# Patient Record
Sex: Female | Born: 1960 | Race: White | Hispanic: No | Marital: Married | Smoking: Former smoker
Health system: Southern US, Community
[De-identification: ages and names within clinical notes are randomized; demographics above are authoritative.]

## PROBLEM LIST (undated history)

## (undated) DIAGNOSIS — Z87442 Personal history of urinary calculi: Secondary | ICD-10-CM

## (undated) DIAGNOSIS — R112 Nausea with vomiting, unspecified: Secondary | ICD-10-CM

## (undated) DIAGNOSIS — Z9889 Other specified postprocedural states: Secondary | ICD-10-CM

## (undated) DIAGNOSIS — N390 Urinary tract infection, site not specified: Secondary | ICD-10-CM

## (undated) DIAGNOSIS — D682 Hereditary deficiency of other clotting factors: Secondary | ICD-10-CM

## (undated) DIAGNOSIS — D649 Anemia, unspecified: Secondary | ICD-10-CM

## (undated) DIAGNOSIS — K589 Irritable bowel syndrome without diarrhea: Secondary | ICD-10-CM

## (undated) DIAGNOSIS — T4145XA Adverse effect of unspecified anesthetic, initial encounter: Secondary | ICD-10-CM

## (undated) DIAGNOSIS — T8859XA Other complications of anesthesia, initial encounter: Secondary | ICD-10-CM

## (undated) DIAGNOSIS — I10 Essential (primary) hypertension: Secondary | ICD-10-CM

## (undated) DIAGNOSIS — M810 Age-related osteoporosis without current pathological fracture: Secondary | ICD-10-CM

## (undated) HISTORY — DX: Irritable bowel syndrome, unspecified: K58.9

## (undated) HISTORY — DX: Hereditary deficiency of other clotting factors: D68.2

## (undated) HISTORY — DX: Personal history of urinary calculi: Z87.442

## (undated) HISTORY — PX: ESOPHAGOGASTRODUODENOSCOPY: SHX1529

## (undated) HISTORY — DX: Urinary tract infection, site not specified: N39.0

## (undated) HISTORY — PX: COLONOSCOPY: SHX174

## (undated) HISTORY — PX: OTHER SURGICAL HISTORY: SHX169

## (undated) HISTORY — DX: Age-related osteoporosis without current pathological fracture: M81.0

---
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
## 1999-09-20 ENCOUNTER — Ambulatory Visit (HOSPITAL_COMMUNITY): Admission: RE | Admit: 1999-09-20 | Discharge: 1999-09-20 | Payer: Self-pay | Admitting: Family Medicine

## 1999-09-20 ENCOUNTER — Encounter: Payer: Self-pay | Admitting: Family Medicine

## 2000-07-04 ENCOUNTER — Other Ambulatory Visit: Admission: RE | Admit: 2000-07-04 | Discharge: 2000-07-04 | Payer: Self-pay | Admitting: Obstetrics and Gynecology

## 2000-07-26 ENCOUNTER — Other Ambulatory Visit: Admission: RE | Admit: 2000-07-26 | Discharge: 2000-07-26 | Payer: Self-pay | Admitting: Obstetrics and Gynecology

## 2000-12-03 ENCOUNTER — Encounter: Admission: RE | Admit: 2000-12-03 | Discharge: 2000-12-03 | Payer: Self-pay | Admitting: Obstetrics and Gynecology

## 2000-12-03 ENCOUNTER — Encounter: Payer: Self-pay | Admitting: Obstetrics and Gynecology

## 2003-07-28 ENCOUNTER — Ambulatory Visit (HOSPITAL_COMMUNITY): Admission: RE | Admit: 2003-07-28 | Discharge: 2003-07-28 | Payer: Self-pay | Admitting: Gastroenterology

## 2004-03-07 ENCOUNTER — Other Ambulatory Visit: Admission: RE | Admit: 2004-03-07 | Discharge: 2004-03-07 | Payer: Self-pay | Admitting: Obstetrics and Gynecology

## 2005-03-07 ENCOUNTER — Other Ambulatory Visit: Admission: RE | Admit: 2005-03-07 | Discharge: 2005-03-07 | Payer: Self-pay | Admitting: Obstetrics and Gynecology

## 2006-01-29 ENCOUNTER — Encounter: Admission: RE | Admit: 2006-01-29 | Discharge: 2006-01-29 | Payer: Self-pay | Admitting: Obstetrics and Gynecology

## 2006-03-12 ENCOUNTER — Other Ambulatory Visit: Admission: RE | Admit: 2006-03-12 | Discharge: 2006-03-12 | Payer: Self-pay | Admitting: Obstetrics and Gynecology

## 2007-03-13 ENCOUNTER — Other Ambulatory Visit: Admission: RE | Admit: 2007-03-13 | Discharge: 2007-03-13 | Payer: Self-pay | Admitting: Obstetrics and Gynecology

## 2007-04-22 ENCOUNTER — Observation Stay (HOSPITAL_COMMUNITY): Admission: EM | Admit: 2007-04-22 | Discharge: 2007-04-24 | Payer: Self-pay | Admitting: Emergency Medicine

## 2007-04-26 ENCOUNTER — Emergency Department (HOSPITAL_COMMUNITY): Admission: EM | Admit: 2007-04-26 | Discharge: 2007-04-26 | Payer: Self-pay | Admitting: Emergency Medicine

## 2007-04-28 ENCOUNTER — Ambulatory Visit (HOSPITAL_COMMUNITY): Admission: RE | Admit: 2007-04-28 | Discharge: 2007-04-28 | Payer: Self-pay | Admitting: Urology

## 2008-03-18 ENCOUNTER — Other Ambulatory Visit: Admission: RE | Admit: 2008-03-18 | Discharge: 2008-03-18 | Payer: Self-pay | Admitting: Obstetrics and Gynecology

## 2009-02-23 ENCOUNTER — Ambulatory Visit: Payer: Self-pay | Admitting: Diagnostic Radiology

## 2009-02-23 ENCOUNTER — Emergency Department (HOSPITAL_BASED_OUTPATIENT_CLINIC_OR_DEPARTMENT_OTHER): Admission: EM | Admit: 2009-02-23 | Discharge: 2009-02-23 | Payer: Self-pay | Admitting: Emergency Medicine

## 2009-03-22 ENCOUNTER — Other Ambulatory Visit: Admission: RE | Admit: 2009-03-22 | Discharge: 2009-03-22 | Payer: Self-pay | Admitting: Obstetrics and Gynecology

## 2009-04-04 ENCOUNTER — Encounter: Admission: RE | Admit: 2009-04-04 | Discharge: 2009-05-12 | Payer: Self-pay | Admitting: Orthopedic Surgery

## 2010-04-06 ENCOUNTER — Other Ambulatory Visit: Admission: RE | Admit: 2010-04-06 | Discharge: 2010-04-06 | Payer: Self-pay | Admitting: Obstetrics and Gynecology

## 2010-05-02 ENCOUNTER — Encounter: Admission: RE | Admit: 2010-05-02 | Discharge: 2010-05-02 | Payer: Self-pay | Admitting: Obstetrics and Gynecology

## 2010-05-09 ENCOUNTER — Encounter: Admission: RE | Admit: 2010-05-09 | Discharge: 2010-05-09 | Payer: Self-pay | Admitting: Obstetrics and Gynecology

## 2010-10-17 ENCOUNTER — Ambulatory Visit: Payer: Self-pay | Admitting: Hematology & Oncology

## 2010-10-17 LAB — CBC WITH DIFFERENTIAL (CANCER CENTER ONLY)
BASO#: 0 10*3/uL (ref 0.0–0.2)
Eosinophils Absolute: 0.1 10*3/uL (ref 0.0–0.5)
HCT: 26.7 % — ABNORMAL LOW (ref 34.8–46.6)
HGB: 7.7 g/dL — ABNORMAL LOW (ref 11.6–15.9)
LYMPH#: 1.4 10*3/uL (ref 0.9–3.3)
LYMPH%: 37 % (ref 14.0–48.0)
MCV: 65 fL — ABNORMAL LOW (ref 81–101)
MONO#: 0.2 10*3/uL (ref 0.1–0.9)
NEUT%: 54 % (ref 39.6–80.0)
RDW: 18.6 % — ABNORMAL HIGH (ref 10.5–14.6)
WBC: 3.8 10*3/uL — ABNORMAL LOW (ref 3.9–10.0)

## 2010-10-18 LAB — FERRITIN: Ferritin: 6 ng/mL — ABNORMAL LOW (ref 10–291)

## 2010-10-18 LAB — COMPREHENSIVE METABOLIC PANEL
ALT: 19 U/L (ref 0–35)
AST: 17 U/L (ref 0–37)
Albumin: 4.2 g/dL (ref 3.5–5.2)
Alkaline Phosphatase: 94 U/L (ref 39–117)
BUN: 14 mg/dL (ref 6–23)
Calcium: 9.4 mg/dL (ref 8.4–10.5)
Chloride: 109 mEq/L (ref 96–112)
Potassium: 3.8 mEq/L (ref 3.5–5.3)
Sodium: 142 mEq/L (ref 135–145)
Total Protein: 6.6 g/dL (ref 6.0–8.3)

## 2010-10-18 LAB — RETICULOCYTES (CHCC)
ABS Retic: 184.9 10*3/uL (ref 19.0–186.0)
RBC.: 4.02 MIL/uL (ref 3.87–5.11)

## 2010-10-18 LAB — IRON AND TIBC: UIBC: 429 ug/dL

## 2010-10-19 ENCOUNTER — Encounter (HOSPITAL_COMMUNITY)
Admission: RE | Admit: 2010-10-19 | Discharge: 2010-12-22 | Payer: Self-pay | Source: Home / Self Care | Attending: Hematology & Oncology | Admitting: Hematology & Oncology

## 2010-10-20 LAB — TYPE & CROSSMATCH - CHCC SATELLITE

## 2010-10-26 LAB — CBC WITH DIFFERENTIAL (CANCER CENTER ONLY)
BASO%: 0.6 % (ref 0.0–2.0)
EOS%: 2.1 % (ref 0.0–7.0)
HCT: 35.9 % (ref 34.8–46.6)
LYMPH%: 30.7 % (ref 14.0–48.0)
MCH: 22.9 pg — ABNORMAL LOW (ref 26.0–34.0)
MCHC: 31.3 g/dL — ABNORMAL LOW (ref 32.0–36.0)
MCV: 73 fL — ABNORMAL LOW (ref 81–101)
MONO#: 0.4 10*3/uL (ref 0.1–0.9)
NEUT%: 57.9 % (ref 39.6–80.0)
RDW: 21.6 % — ABNORMAL HIGH (ref 10.5–14.6)

## 2010-12-05 ENCOUNTER — Ambulatory Visit: Payer: Self-pay | Admitting: Hematology & Oncology

## 2010-12-07 LAB — CBC WITH DIFFERENTIAL (CANCER CENTER ONLY)
BASO%: 0.4 % (ref 0.0–2.0)
EOS%: 3.5 % (ref 0.0–7.0)
MCH: 26.6 pg (ref 26.0–34.0)
MCHC: 32.4 g/dL (ref 32.0–36.0)
MONO%: 6.1 % (ref 0.0–13.0)
NEUT#: 2.2 10*3/uL (ref 1.5–6.5)
Platelets: 253 10*3/uL (ref 145–400)

## 2010-12-07 LAB — RETICULOCYTES (CHCC): Retic Ct Pct: 1.5 % (ref 0.4–3.1)

## 2010-12-07 LAB — CHCC SATELLITE - SMEAR

## 2011-01-22 ENCOUNTER — Ambulatory Visit (HOSPITAL_BASED_OUTPATIENT_CLINIC_OR_DEPARTMENT_OTHER): Payer: BC Managed Care – PPO | Admitting: Hematology & Oncology

## 2011-01-25 ENCOUNTER — Encounter (HOSPITAL_BASED_OUTPATIENT_CLINIC_OR_DEPARTMENT_OTHER): Payer: BC Managed Care – PPO | Admitting: Hematology & Oncology

## 2011-01-25 DIAGNOSIS — R195 Other fecal abnormalities: Secondary | ICD-10-CM

## 2011-01-25 DIAGNOSIS — R197 Diarrhea, unspecified: Secondary | ICD-10-CM

## 2011-01-25 DIAGNOSIS — D509 Iron deficiency anemia, unspecified: Secondary | ICD-10-CM

## 2011-01-25 DIAGNOSIS — K589 Irritable bowel syndrome without diarrhea: Secondary | ICD-10-CM

## 2011-01-25 LAB — CBC WITH DIFFERENTIAL (CANCER CENTER ONLY)
BASO%: 0.6 % (ref 0.0–2.0)
Eosinophils Absolute: 0.1 10*3/uL (ref 0.0–0.5)
HCT: 43 % (ref 34.8–46.6)
LYMPH#: 1.7 10*3/uL (ref 0.9–3.3)
MONO#: 0.3 10*3/uL (ref 0.1–0.9)
NEUT%: 50.6 % (ref 39.6–80.0)
RBC: 5.07 10*6/uL (ref 3.70–5.32)
RDW: 12.1 % (ref 10.5–14.6)
WBC: 4.3 10*3/uL (ref 3.9–10.0)

## 2011-01-25 LAB — RETICULOCYTES (CHCC)
ABS Retic: 96.5 10*3/uL (ref 19.0–186.0)
RBC.: 5.08 MIL/uL (ref 3.87–5.11)
Retic Ct Pct: 1.9 % (ref 0.4–3.1)

## 2011-01-25 LAB — CHCC SATELLITE - SMEAR

## 2011-03-07 LAB — CROSSMATCH

## 2011-04-05 ENCOUNTER — Other Ambulatory Visit: Payer: Self-pay | Admitting: Hematology & Oncology

## 2011-04-05 ENCOUNTER — Encounter (HOSPITAL_BASED_OUTPATIENT_CLINIC_OR_DEPARTMENT_OTHER): Payer: BLUE CROSS/BLUE SHIELD | Admitting: Hematology & Oncology

## 2011-04-05 DIAGNOSIS — R197 Diarrhea, unspecified: Secondary | ICD-10-CM

## 2011-04-05 DIAGNOSIS — D509 Iron deficiency anemia, unspecified: Secondary | ICD-10-CM

## 2011-04-05 DIAGNOSIS — R195 Other fecal abnormalities: Secondary | ICD-10-CM

## 2011-04-05 DIAGNOSIS — K589 Irritable bowel syndrome without diarrhea: Secondary | ICD-10-CM

## 2011-04-05 LAB — CBC WITH DIFFERENTIAL (CANCER CENTER ONLY)
Eosinophils Absolute: 0.1 10*3/uL (ref 0.0–0.5)
LYMPH#: 1.9 10*3/uL (ref 0.9–3.3)
MCV: 83 fL (ref 81–101)
MONO#: 0.4 10*3/uL (ref 0.1–0.9)
NEUT#: 2.6 10*3/uL (ref 1.5–6.5)
Platelets: 272 10*3/uL (ref 145–400)
RBC: 4.83 10*6/uL (ref 3.70–5.32)
WBC: 5.1 10*3/uL (ref 3.9–10.0)

## 2011-04-05 LAB — RETICULOCYTES (CHCC)
ABS Retic: 104.4 10*3/uL (ref 19.0–186.0)
RBC.: 4.97 MIL/uL (ref 3.87–5.11)
Retic Ct Pct: 2.1 % (ref 0.4–3.1)

## 2011-04-05 LAB — FERRITIN: Ferritin: 21 ng/mL (ref 10–291)

## 2011-04-05 LAB — IRON AND TIBC: Iron: 66 ug/dL (ref 42–145)

## 2011-04-10 ENCOUNTER — Encounter (HOSPITAL_BASED_OUTPATIENT_CLINIC_OR_DEPARTMENT_OTHER): Payer: BLUE CROSS/BLUE SHIELD | Admitting: Hematology & Oncology

## 2011-04-10 DIAGNOSIS — D509 Iron deficiency anemia, unspecified: Secondary | ICD-10-CM

## 2011-04-11 ENCOUNTER — Emergency Department (HOSPITAL_COMMUNITY): Payer: Worker's Compensation

## 2011-04-11 ENCOUNTER — Inpatient Hospital Stay (HOSPITAL_COMMUNITY)
Admission: EM | Admit: 2011-04-11 | Discharge: 2011-04-14 | DRG: 512 | Disposition: A | Discharge status: Home / Self Care | Payer: Worker's Compensation | Attending: Orthopedic Surgery | Admitting: Orthopedic Surgery

## 2011-04-11 DIAGNOSIS — W19XXXA Unspecified fall, initial encounter: Secondary | ICD-10-CM | POA: Diagnosis present

## 2011-04-11 DIAGNOSIS — I10 Essential (primary) hypertension: Secondary | ICD-10-CM | POA: Diagnosis present

## 2011-04-11 DIAGNOSIS — Y990 Civilian activity done for income or pay: Secondary | ICD-10-CM

## 2011-04-11 DIAGNOSIS — S52009A Unspecified fracture of upper end of unspecified ulna, initial encounter for closed fracture: Principal | ICD-10-CM | POA: Diagnosis present

## 2011-04-11 DIAGNOSIS — S52023A Displaced fracture of olecranon process without intraarticular extension of unspecified ulna, initial encounter for closed fracture: Secondary | ICD-10-CM | POA: Diagnosis present

## 2011-04-11 LAB — DIFFERENTIAL
Eosinophils Absolute: 0 10*3/uL (ref 0.0–0.7)
Eosinophils Relative: 0 % (ref 0–5)
Lymphs Abs: 1.6 10*3/uL (ref 0.7–4.0)
Monocytes Relative: 5 % (ref 3–12)

## 2011-04-11 LAB — COMPREHENSIVE METABOLIC PANEL
AST: 24 U/L (ref 0–37)
Albumin: 4.2 g/dL (ref 3.5–5.2)
BUN: 15 mg/dL (ref 6–23)
CO2: 25 mEq/L (ref 19–32)
Calcium: 9.9 mg/dL (ref 8.4–10.5)
Creatinine, Ser: 0.73 mg/dL (ref 0.4–1.2)
GFR calc non Af Amer: >60 mL/min (ref >60)

## 2011-04-11 LAB — CBC
MCH: 28.7 pg (ref 26.0–34.0)
MCV: 84.2 fL (ref 78.0–100.0)
Platelets: 266 10*3/uL (ref 150–400)
RDW: 13.6 % (ref 11.5–15.5)

## 2011-04-11 LAB — URINALYSIS, ROUTINE W REFLEX MICROSCOPIC
Hgb urine dipstick: NEGATIVE
Ketones, ur: 15 mg/dL — AB
Protein, ur: 30 mg/dL — AB
Urobilinogen, UA: 0.2 mg/dL (ref 0.0–1.0)

## 2011-04-11 LAB — URINE MICROSCOPIC-ADD ON

## 2011-04-17 NOTE — Consult Note (Signed)
  NAMEEVEE, LISKA                 ACCOUNT NO.:  [id number]  MEDICAL RECORD NO.:  [id number]           PATIENT TYPE:  I  LOCATION:  1617                         FACILITY:  [facility]  PHYSICIAN:  Madelynn Done, MD  DATE OF BIRTH:  02/23/1961  DATE OF CONSULTATION:  04/12/2011 DATE OF DISCHARGE:                                CONSULTATION   Ms. Arrighi is a 50 year old left hand dominant female who sustained elbow fracture dislocation.  The patient was seen and evaluated at bedside. Her full history and physical were notified by Alphonsa Overall, PA-C.  ER records were reviewed.  SUMMARY:  The patient had a closed Monteggia fracture dislocation with a fracture of the radial head, posterolateral dislocation and fracture of the proximal olecranon.  While I was at the bedside, 15 cc of 1% Xylocaine were injected into the elbow joint and then the elbow was reduced, placed in a long-arm splint for comfort.  The patient tolerated the reduction at the bedside.  After she was placed in a splint, her pain was better controlled.  I had an extensive conversation with both she and her husband about the nature of her injury.  We talked about at length the CT scan, the radiographs showing the nature of the injury, fracture pattern and the dislocation.  We talked about operative intervention to restore the elbow congruity at both the radial head and the proximal olecranon.  We talked about surgical intervention including ORIF of proximal radius and proximal ulna as well as potential complications of surgery to include but not limited to reaction to anesthesia, nonunion, malunion, hardware failure, loss of motion of the wrist and digits, damage to nearby nerves, arteries or tendons, infection, stiffness in her elbow and need for further surgical intervention.  Again, patient's questions were addressed with both she and her husband.  Approximately, over 30 minutes were spent counseling the patient and the  husband.  They voiced understanding the reason for the intervention and agreed to proceed accordingly within the coming 24 hours.  All questions were answered for her today.  She will be admitted for IV pain medications for the upcoming surgery.     Madelynn Done, MD     FWO/MEDQ  D:  04/12/2011  T:  04/13/2011  Job:  [id number]  Electronically Signed by Bradly Bienenstock IV MD on 04/17/2011 05:52:41 PM

## 2011-04-17 NOTE — Discharge Summary (Signed)
  Jamie Mcdowell, TERHAAR                 ACCOUNT NO.:  [id number]  MEDICAL RECORD NO.:  [id number]           PATIENT TYPE:  I  LOCATION:  1617                         FACILITY:  [facility]  PHYSICIAN:  Madelynn Done, MD  DATE OF BIRTH:  11-25-1961  DATE OF ADMISSION:  04/11/2011 DATE OF DISCHARGE:  04/14/2011                              DISCHARGE SUMMARY   ADMISSION DIAGNOSES: 1. Left elbow fracture dislocation, Monteggia variant. 2. Hypertension.  DISCHARGE DIAGNOSES: 1. Left elbow fracture dislocation, Monteggia variant. 2. Hypertension.  PROCEDURE:  Open reduction and internal fixation of displaced left elbow fracture dislocation on April 12, 2011.  DISCHARGE MEDICATIONS: 1. Percocet 5/325 1 to 2 tablets every 4 to 6 hours as needed for     pain. 2. Colace 100 mg p.o. b.i.d. 3. Robaxin 500 mg p.o. q.6h. p.r.n. spasm. 4. Resume home medications and doses to include over-the-counter     Prilosec.  REASON FOR ADMISSION:  Jamie Mcdowell is a 50 year old female who sustained a closed elbow fracture dislocation.  The patient was initially reduced, splinted and then admitted for pain control and operative intervention. The patient voiced understanding and the reason for the admission.  HOSPITAL COURSE:  The patient admitted to the orthopedic floor following the above procedure.  The patient tolerated this well under general anesthesia.  The patient was continued on postoperative IV pain medications.  She was followed throughout her hospital course.  The patient seen and examined on postoperative day #2.  She was afebrile. Vital signs stable and normal.  Tolerating regular diet and felt ready to be discharged to home.  On the day of discharge, the patient voiced understand of the plan and discharge instructions.  All questions were answered.  RECOMMENDATIONS AND DISPOSITION:  The patient will be discharged to home, seen back in the office in approximately 10 to 14 days for wound check  and possible suture removal and then begin therapy regimen, working on her elbow and begin some gentle active range of motion. Continue the above discharge medications.  If she has any worsening pain or problems, she does have my cell phone number to contact me.  All questions were answered.  The patient voiced understanding.  CONDITION ON DISCHARGE:  Good.     Madelynn Done, MD     FWO/MEDQ  D:  04/16/2011  T:  04/16/2011  Job:  [id number]  Electronically Signed by Bradly Bienenstock IV MD on 04/17/2011 05:52:49 PM

## 2011-04-17 NOTE — Op Note (Signed)
Jamie Mcdowell, Jamie Mcdowell                 ACCOUNT NO.:  [id number]  MEDICAL RECORD NO.:  [id number]           PATIENT TYPE:  I  LOCATION:  1617                         FACILITY:  [facility]  PHYSICIAN:  Madelynn Done, MD  DATE OF BIRTH:  1961/03/20  DATE OF PROCEDURE: DATE OF DISCHARGE:                              OPERATIVE REPORT   PREOPERATIVE DIAGNOSES:  Left elbow Monteggia fracture dislocation with fracture of the proximal radius, elbow dislocation and proximal olecranon fracture.  POSTOPERATIVE DIAGNOSES:  Left elbow Monteggia fracture dislocation with fracture of the proximal radius, elbow dislocation and proximal olecranon fracture.  ATTENDING PHYSICIAN:  Sharma Covert IV, MD, who was scrubbed and present for the entire procedure.  ASSISTANT SURGEON:  Maisie Fus B. Dixon, PA-C, who was scrubbed and present for key portions of procedure.  PROCEDURES: 1. Open treatment of left proximal olecranon fracture with internal     fixation. 2. Open treatment of left proximal radius fracture with internal     fixation, radial head. 3. Open treatment of left elbow dislocation. 4. Radiographs 3 views, stress radiography.  SURGICAL IMPLANTS: 1. For the radial head, one 18 micro Acutrak screw. 2. For the proximal olecranon, DePuy, proximal olecranon plate with 3     bicortical screws distally, 1 bicortical lag screw and 5 locking     screws proximally.  These are 3.5 mm screws.  SURGICAL INDICATIONS:  Jamie Mcdowell is a 50 year old right-hand-dominant female who sustained a very bad elbow injury while she was at school. The patient was seen and evaluated in the emergency department and at the bedside it was recommended given the nature of her injury to undergo the above procedure.  Risks, benefits, and alternatives were discussed in detail with the patient and signed informed consent was obtained. Risks include, but not limited to bleeding, infection, damage to nearby nerves, arteries or  tendons, nonunion, malunion, hardware failure, stiffness of the elbow, loss of motion of wrist and digits and need for further surgical intervention.  DESCRIPTION OF PROCEDURE:  The patient was properly identified in preop holding area and marker made on the left elbow to indicate correct operative site.  The patient was brought back to the operating room, placed supine on anesthesia table where general anesthesia was administered.  The patient received preoperative antibiotics prior to skin incision.  The patient was then placed in a slightly lazy lateral position bringing along the arm to move nicely across the chest.  All pressure points were well padded.  A Foley catheter was placed.  The patient tolerated this well.  SCDs were in place the entire time.  After this was carried out, the left upper extremity was then prepped and draped in normal sterile fashion.  Timeout was called.  The correct side was identified and the procedure was then begun.  Attention was then turned to the left elbow where a curvilinear incision was made directly over the olecranon tip curving radially.  Dissection was then carried down through the skin and subcutaneous tissue.  The fascial layer was incised longitudinally and the fracture site was then opened and exposed.  The patient did have a highly comminuted proximal olecranon fracture.  Carrying the dissection radially, the posterolateral wall of the proximal olecranon is blown out and that is where the dislocation of the elbow was then done.  The elbow was reduced openly.  There was good articulation between the proximal radius and the capitellum.  This allowed for good visualization of the joint.  Through this area, the proximal radial head was then identified and the fracture line was identified.  The fracture line had reduced very well.  Open treatment of the radial head fracture was then carried out.  Two K-wires were then placed in the radial head  fracture and then appropriate depth gauge and measurements were done with mini Acutrak screw for the small anterior radial head piece.  Once this was carried out, radiographs were then obtained in all 3 views to confirm placement of the radial head fracture and the internal fixation in good position.  Following this, attention was then turned to proximal olecranon.  The open reduction was then carried out.  The fracture fragments then had large good soft tissue windows attached underneath, the comminuted fragments were then carefully manipulated back into place, keeping the elbow in good position.  The DePuy proximal olecranon plate was then applied and then held temporarily in place with the K-wire proximally and distally to confirm the length and position of the olecranon.  Once this was confirmed, proximal fixation was then carried out with 5 locking screws in the proximal segment.  There was good purchase and good fixation of locking screws.  Attention was then turned distally where 3 more bicortical nonlocking screws were then placed in the plane. The fracture aligned very nicely.  The patient did have 1 large lag and 1 large butterfly fragment, which was able to be captured after reduction clamp was then applied and then with a 3.5 bicortical nonlocking screw in the fracture fragment.  The patient again did have a highly comminuted diaphyseal portion of the bone but these pieces were able to be saved to reconstitute the diaphysis of the proximal olecranon.  Once this was carried out with fixation of the proximal olecranon, final radiographs and stress radiography was then carried out in AP, lateral, and oblique planes showing internal fixation in place with good congruity of the ulnar humeral joint and radiocapitellar joint.  Following this, the wounds were then thoroughly irrigated.  The tourniquet had been insufflated for 113 minutes to 250 mmHg.  There was good hemostasis  following deflation of the tourniquet.  Copious irrigation done.  The interval along the posterolateral region of the elbow was then closed with 2-0 Vicryl suture.  Once this was carried out, the fascial layer over the plate was then closed with 2-0 Vicryl. The subcutaneous tissue was closed with 4-0 Vicryl and the skin closed with skin staples.  10 cc of 0.25% Marcaine was then infiltrated posteriorly.  Adaptic dressing and sterile compressive bandage were then applied.  The patient was then placed in a well-padded long-arm splint. The patient was then extubated and taken to recovery room in good condition.  POSTPROCEDURAL PLAN:  The patient will be admitted for IV antibiotic and pain control and is to be discharged once her pain is controlled.  See her back in the office in approximately 14 days for wound check, staple removal and then begin some gentle active range of motion to get the elbow moving, radiographs at each visit.     Madelynn Done, MD  FWO/MEDQ  D:  04/12/2011  T:  04/13/2011  Job:  [id number]  Electronically Signed by Bradly Bienenstock IV MD on 04/17/2011 05:52:45 PM

## 2011-05-11 NOTE — Op Note (Signed)
   NAMEARANTZA, DARRINGTON                           ACCOUNT NO.:  [id number]   MEDICAL RECORD NO.:  [id number]                   PATIENT TYPE:  AMB   LOCATION:  ENDO                                 FACILITY:  [facility]   PHYSICIAN:  Anselmo Rod, M.D.               DATE OF BIRTH:  [birth year]   DATE OF PROCEDURE:  07/28/2003  DATE OF DISCHARGE:                                 OPERATIVE REPORT   PROCEDURE:  Colonoscopy.   ENDOSCOPIST:  Charna Elizabeth, M.D.   INSTRUMENT USED:  Olympus video colonoscope.   INDICATIONS FOR PROCEDURE:  49 year old white female with a history of  rectal bleeding, rule out colonic polyps, masses, hemorrhoids, etc.   PREPROCEDURE PREPARATION:  Informed consent was obtained from the patient.  The patient was fasted for eight hours prior to the procedure and prepped  with a bottle of magnesium citrate and a gallon of GoLYTELY the night prior  to the procedure.   PREPROCEDURE PHYSICAL:  Patient with stable vital signs.  Neck supple.  Chest clear to auscultation.  S1 and S2 regular.  Abdomen soft with normal  bowel sounds.   DESCRIPTION OF PROCEDURE:  The patient was placed in the left lateral  decubitus position, sedated with 140 mg of Demerol and 13 mg Versed  intravenously.  Once the patient was adequately sedated, maintained on low  flow oxygen, continuous cardiac monitoring, the Olympus video colonoscope  was advanced into the rectum to the cecum and terminal ileum.  The patient  had some abdominal discomfort with insufflation of air into the colon.  A  small fistulous opening was seen on careful anal inspection.  It is  difficult to say whether this is a small fissure versus a fistula.  There  was some mucoid discharge from this area.  Retroflexion in the rectum  reveals no abnormalities.  The terminal ileum appeared normal.   IMPRESSION:  Normal appearing colon and terminal ileum except for  questionable fissure versus fistula on anal inspection.    RECOMMENDATIONS:  1. Local lidocaine to be applied around the rectum for symptomatic relief.  2. Outpatient follow up in the next two weeks for further recommendations.                                               Anselmo Rod, M.D.    JNM/MEDQ  D:  07/28/2003  T:  07/28/2003  Job:  [id number]   cc:   Pollyann Savoy, M.D.  [address]  [place], [place] [address]  Fax: [phone]   [name], M.D.  [address]  [place]  [place] [address]  Fax: [phone]

## 2011-07-13 ENCOUNTER — Encounter (HOSPITAL_BASED_OUTPATIENT_CLINIC_OR_DEPARTMENT_OTHER): Payer: BLUE CROSS/BLUE SHIELD | Admitting: Hematology & Oncology

## 2011-07-13 ENCOUNTER — Other Ambulatory Visit: Payer: Self-pay | Admitting: Hematology & Oncology

## 2011-07-13 DIAGNOSIS — K589 Irritable bowel syndrome without diarrhea: Secondary | ICD-10-CM

## 2011-07-13 DIAGNOSIS — R195 Other fecal abnormalities: Secondary | ICD-10-CM

## 2011-07-13 DIAGNOSIS — R197 Diarrhea, unspecified: Secondary | ICD-10-CM

## 2011-07-13 DIAGNOSIS — D509 Iron deficiency anemia, unspecified: Secondary | ICD-10-CM

## 2011-07-13 LAB — CBC WITH DIFFERENTIAL (CANCER CENTER ONLY)
BASO#: 0 10*3/uL (ref 0.0–0.2)
HCT: 43.5 % (ref 34.8–46.6)
HGB: 15.2 g/dL (ref 11.6–15.9)
LYMPH#: 2 10*3/uL (ref 0.9–3.3)
MONO#: 0.4 10*3/uL (ref 0.1–0.9)
NEUT#: 2.3 10*3/uL (ref 1.5–6.5)
NEUT%: 48.3 % (ref 39.6–80.0)
RBC: 5.02 10*6/uL (ref 3.70–5.32)
WBC: 4.8 10*3/uL (ref 3.9–10.0)

## 2011-07-13 LAB — RETICULOCYTES (CHCC)
ABS Retic: 131.6 10*3/uL (ref 19.0–186.0)
RBC.: 5.06 MIL/uL (ref 3.87–5.11)
Retic Ct Pct: 2.6 % — ABNORMAL HIGH (ref 0.4–2.3)

## 2011-10-20 DIAGNOSIS — D509 Iron deficiency anemia, unspecified: Secondary | ICD-10-CM | POA: Insufficient documentation

## 2011-11-05 ENCOUNTER — Encounter: Payer: Self-pay | Admitting: *Deleted

## 2011-11-13 ENCOUNTER — Other Ambulatory Visit (HOSPITAL_BASED_OUTPATIENT_CLINIC_OR_DEPARTMENT_OTHER): Payer: BLUE CROSS/BLUE SHIELD | Admitting: Lab

## 2011-11-13 ENCOUNTER — Ambulatory Visit (HOSPITAL_BASED_OUTPATIENT_CLINIC_OR_DEPARTMENT_OTHER): Payer: BLUE CROSS/BLUE SHIELD | Admitting: Hematology & Oncology

## 2011-11-13 ENCOUNTER — Other Ambulatory Visit: Payer: Self-pay | Admitting: Hematology & Oncology

## 2011-11-13 VITALS — BP 176/116 | Temp 98.4°F | Ht 64.5 in | Wt 193.0 lb

## 2011-11-13 DIAGNOSIS — D509 Iron deficiency anemia, unspecified: Secondary | ICD-10-CM

## 2011-11-13 DIAGNOSIS — I10 Essential (primary) hypertension: Secondary | ICD-10-CM

## 2011-11-13 LAB — RETICULOCYTES (CHCC)
ABS Retic: 136.1 10*3/uL (ref 19.0–186.0)
RBC.: 4.86 MIL/uL (ref 3.87–5.11)
RBC.: 4.86 MIL/uL (ref 3.87–5.11)
RBC.: 4.86 MIL/uL (ref 3.87–5.11)
Retic Ct Pct: 2.8 % — ABNORMAL HIGH (ref 0.4–2.3)
Retic Ct Pct: 2.8 % — ABNORMAL HIGH (ref 0.4–2.3)

## 2011-11-13 LAB — CBC WITH DIFFERENTIAL (CANCER CENTER ONLY)
BASO#: 0 10*3/uL (ref 0.0–0.2)
EOS%: 3.6 % (ref 0.0–7.0)
Eosinophils Absolute: 0.2 10*3/uL (ref 0.0–0.5)
HGB: 13.9 g/dL (ref 11.6–15.9)
LYMPH#: 1.5 10*3/uL (ref 0.9–3.3)
MCHC: 34.2 g/dL (ref 32.0–36.0)
MONO%: 8.4 % (ref 0.0–13.0)
NEUT#: 2.2 10*3/uL (ref 1.5–6.5)
Platelets: 270 10*3/uL (ref 145–400)
RBC: 4.77 10*6/uL (ref 3.70–5.32)

## 2011-11-13 LAB — CHCC SATELLITE - SMEAR

## 2011-11-13 LAB — IRON AND TIBC
TIBC: 339 ug/dL (ref 250–470)
TIBC: 339 ug/dL (ref 250–470)
UIBC: 270 ug/dL (ref 125–400)

## 2011-11-13 MED ORDER — TRIAMTERENE-HCTZ 37.5-25 MG PO TABS: 1.0000 | ORAL_TABLET | Freq: Every day | ORAL | Status: DC

## 2011-11-13 NOTE — Progress Notes (Signed)
CC:   [name], M.D. Artist Pais, M.D. Willis Modena, MD  DIAGNOSIS: 1. 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 2. Hypertension.  CURRENT THERAPY:  IV iron as indicated.  INTERVAL HISTORY:  Jamie Mcdowell comes in for followup.  She got iron back in April.  The following day, she fell at work.  She shattered her left forearm.  She required surgery for this.  She took rehab.  She still has some limitations of the left elbow, but it is getting better.  She is worried about fluid retention.  Her blood pressure has been very high.  She is not taking anything for her blood pressure.  I think she was on something, but she did not like what she took and stopped taking it.  She sees, I think, Dr. Katrinka Blazing next month for blood pressure management.  I will go ahead and put her on some Maxzide to see if this does not help with blood pressure control.  She has not noticed any bleeding.  There has been no melena or bright red blood per rectum.  She has not noted any obvious swelling in her legs.  She has had a good appetite.  She is trying to watch what she eats.  There has been no cough or shortness of breath.  PHYSICAL EXAMINATION:  General:  This is a well-developed, well- nourished white female in no obvious distress.  Vital Signs:  98.4, pulse 61, respiratory rate 18, blood pressure 136/116.  Weight is 193. Head and Neck Exam:  Shows a normocephalic, atraumatic skull.  There are no ocular or oral lesions.  There are no palpable cervical or supraclavicular lymph nodes.  Lungs:  Clear bilaterally.  Cardiac Exam: Regular rate and rhythm with normal S1 and S2.  There are no murmurs, rubs or bruits.  Abdominal Exam:  Soft with good bowel sounds.  There is no palpable abdominal mass.  There is no fluid wave.  There is no palpable hepatosplenomegaly.  Back Exam:  No tenderness over the spine, ribs or hips.  Extremities:  Show the surgical scar in the left forearm. This  is well healed.  She has some limited range motion of the left elbow.  She has good pulses in her distal extremities.  Skin Exam:  No rashes, ecchymosis or petechia.  LABORATORY STUDIES:  White cell count is 4.2, hemoglobin 13.9, hematocrit 40.6, platelet count 270.  MCV is 85.  IMPRESSION:  Jamie Mcdowell is a 50 year old white female with a history of iron deficiency anemia.  Hopefully, her iron studies will be okay today. Her blood count is down a little, but she is not anemic.  My concern is her blood pressure.  If she does not get this under better control, she will start developing kidney problems, and then she will really have issues with anemia.  We will plan to get her back to see [place] in another 3 months or so.  She is supposed to see her family doctor in a month, and hopefully, her blood pressure will be addressed.    ______________________________ Josph Macho, M.D. PRE/MEDQ  D:  11/13/2011  T:  11/13/2011  Job:  [id number]  ADDENDUM:  Ferritin is 39.  %Sat is 20.

## 2011-11-13 NOTE — Progress Notes (Signed)
This office note has been dictated.

## 2011-11-13 NOTE — Progress Notes (Signed)
CC:   Jamie Mcdowell, M.D. Jamie Mcdowell, M.D. Jamie Modena, MD  DIAGNOSIS: 1. Iron deficiency anemia. 2. Hypertension.  CURRENT THERAPY:  IV iron as indicated.  INTERVAL HISTORY:  Jamie Mcdowell comes in for followup.  I think she last got iron back in April.  She did well with the iron pills, the next day, however, she apparently fell at work.  She shattered her left forearm. She needed surgery for this.  She has been doing rehab for this.  She has a not had any obvious bleeding.  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 Her blood pressure has been poorly controlled. She is not too keen on taking blood pressure medication.  She is worried about fluid retention.  I want to put her on some diuretic.  I will put her on Maxzide(37.5/25) and hopefully this may help her a little bit.  PHYSICAL EXAM:  General:  This is a well-developed, well-nourished white female in no obvious distress.  Vital signs:  Temperature 98.4, pulse 61, respiratory rate 18, blood pressure 176/116.  Weight is 193.  Head and neck:  Shows a normocephalic, atraumatic skull.  There are no ocular or oral lesions.  There are no palpable cervical, supraclavicular lymph nodes.  Lungs:  Clear bilaterally.  Cardiac:  Regular rate and rhythm with a normal S1, S2.  There are no murmurs, rubs or bruits.  Abdomen: Soft with good bowel sounds.  There is no palpable abdominal mass. There is no palpable hepatosplenomegaly.  Extremities:  Shows the surgical scar on the flexor aspect of the left forearm.  She has minimal swelling of the left forearm.  She does have some decreased range of motion of the left elbow.  He has good pulses in her distal extremities. Skin:  Shows no rashes, ecchymosis or petechia.  LABORATORY STUDIES:  White cell count is 4.2, hemoglobin 14, hematocrit 40.6, platelet count 270.  IMPRESSION:  Jamie Mcdowell is a 50 year old white female with history of iron- deficiency anemia.  She has  history of peptic ulcer disease.  We will see what her iron levels are.  We will try to hold off on iron if we can.  I am not sure as to why she fell and shattered her left forearm.  She is recovering from this pretty well with the help of surgery.  I think we can probably get her back in 4 months' time for followup for her anemia.  I did give her the Maxzide.  She is supposed to see her family doctor in 1 month.  Hopefully, her blood pressure can get under better control.  I told Jamie Mcdowell that if her blood pressure is not controlled that her kidneys will suffer.  This will really cause problems with anemia.    ______________________________ Josph Macho, M.D. PRE/MEDQ  D:  11/13/2011  T:  11/13/2011  Job:  [id number]  ADDENDUM:  Ferritin is 39.  %Sat is 20%.

## 2012-02-08 IMAGING — CT CT ELBOW*L* W/O CM
1 series · 12 of 14 positions shown, 15 images · non-contrast
Comparison: 04/11/2011 elbow radiographs.

CLINICAL DATA: Fall today.  Evaluate elbow fracture.

CT OF THE LEFT ELBOW WITHOUT CONTRAST
TECHNIQUE: Multidetector CT imaging was performed according to the
standard protocol. Multiplanar CT image reconstructions were also
generated.

[Series 6: left elbow 3.0 b30s · axial · 0.22mm/px · z∈[+867,+984]mm · 12 of 47 slices shown, 15 images]
[im 4/47  soft-tissue]
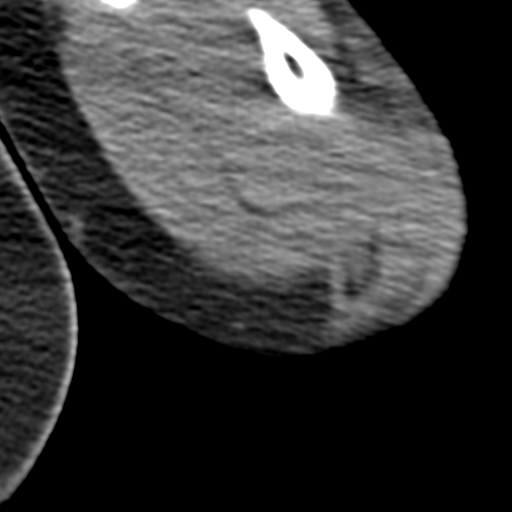
[im 4/47  bone]
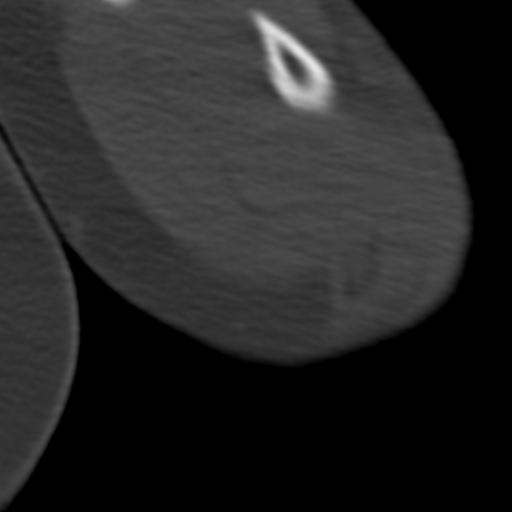
[im 8/47  bone]
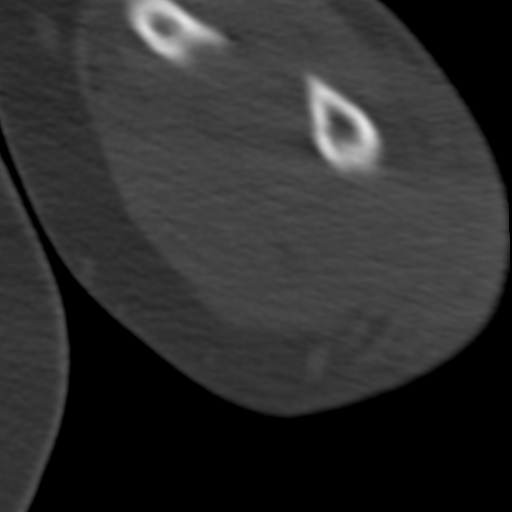
[im 11/47  bone]
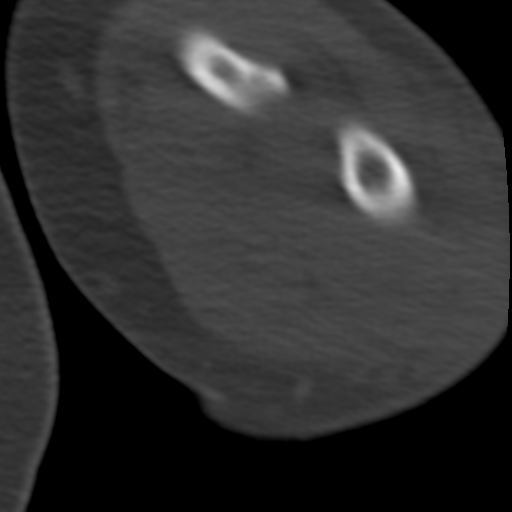
[im 15/47  bone]
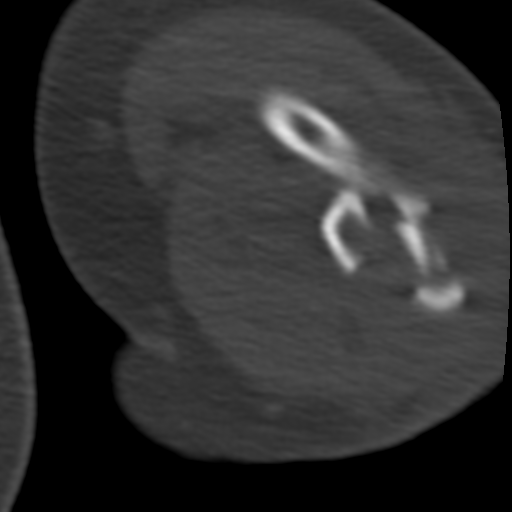
[im 18/47  soft-tissue]
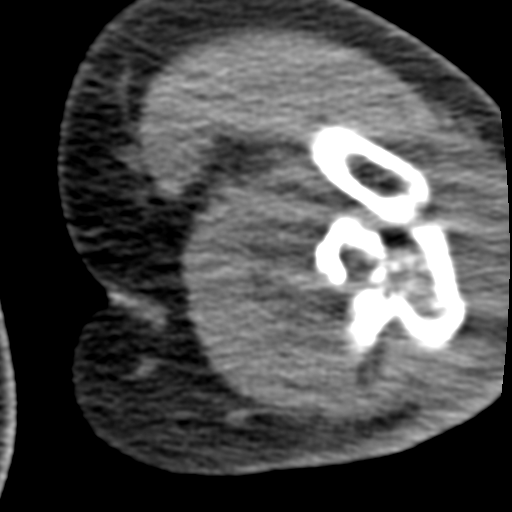
[im 18/47  bone]
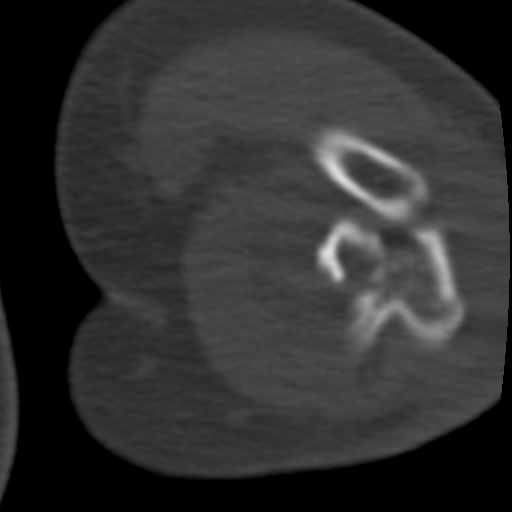
[im 22/47  bone]
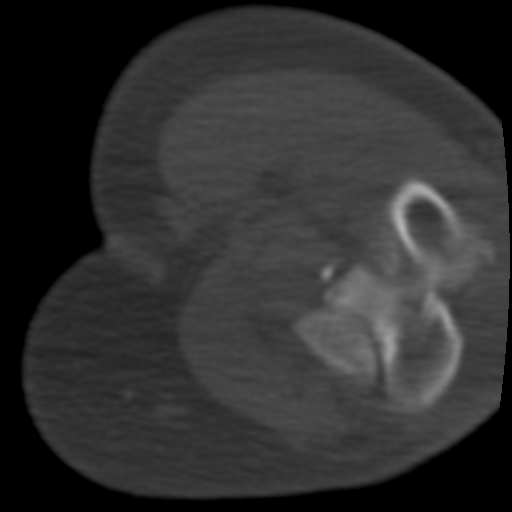
[im 25/47  bone]
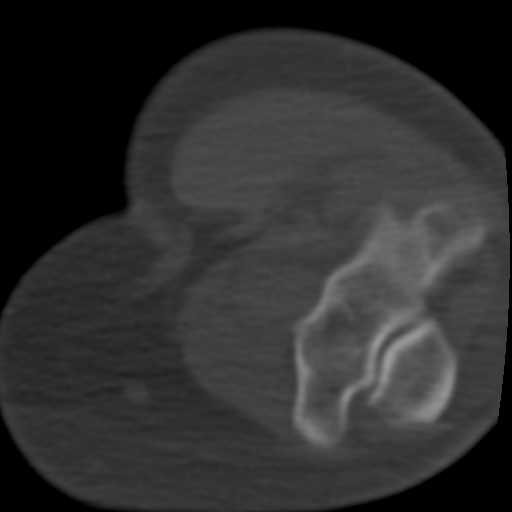
[im 29/47  bone]
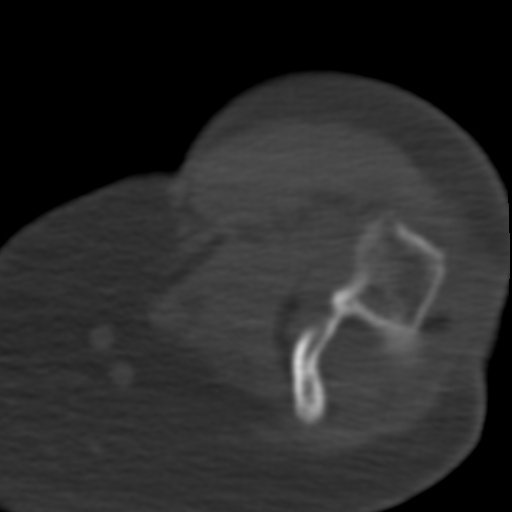
[im 32/47  soft-tissue]
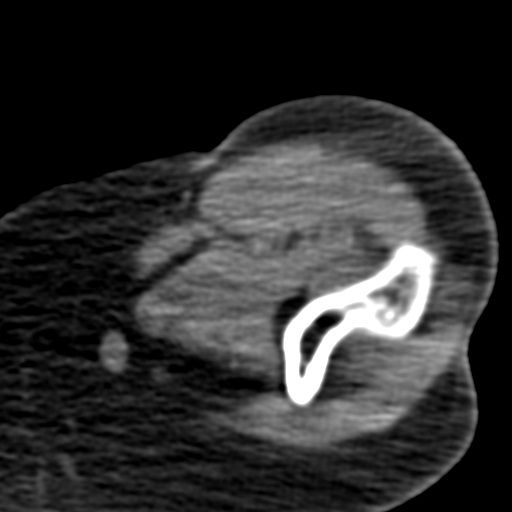
[im 32/47  bone]
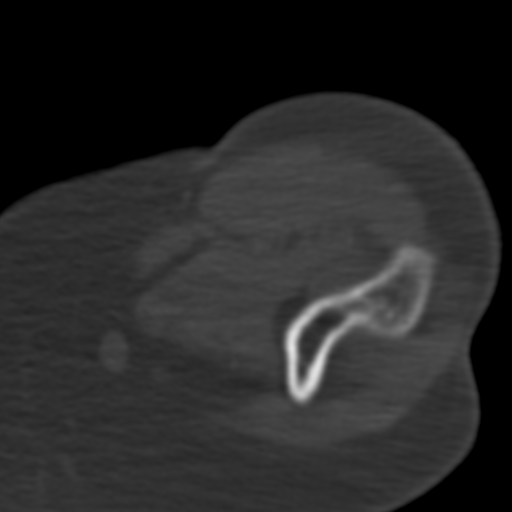
[im 36/47  bone]
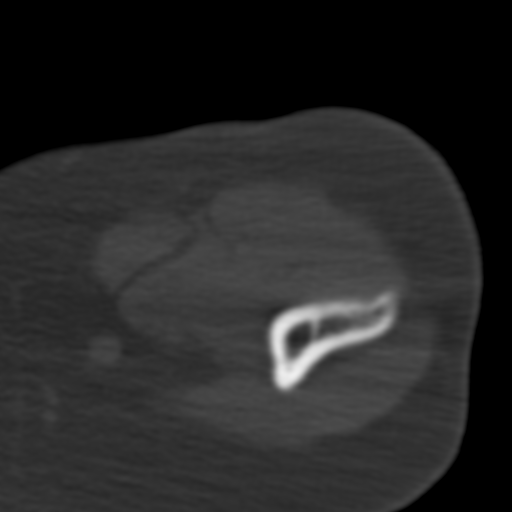
[im 39/47  bone]
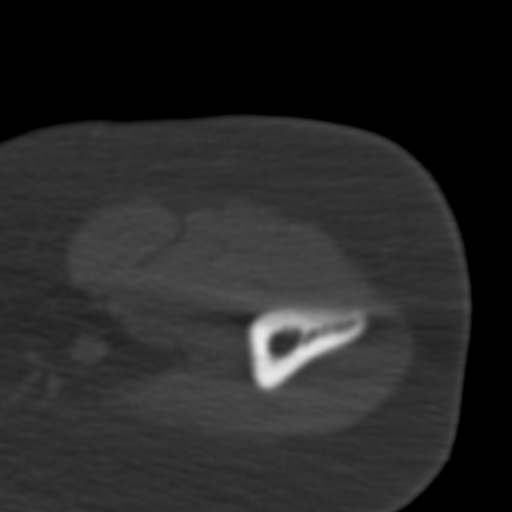
[im 43/47  bone]
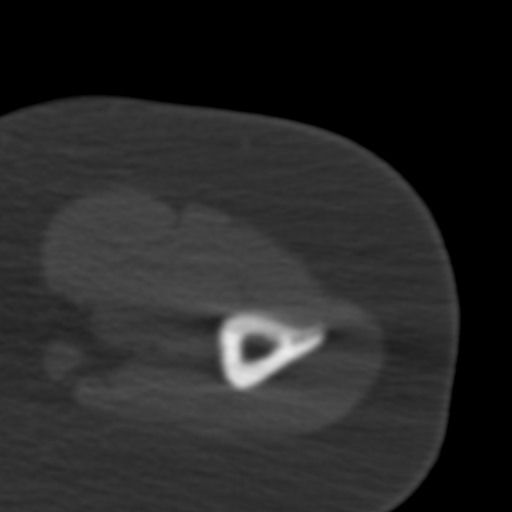

[12 of 14 positions shown; findings below may reference images not displayed]

FINDINGS: There is posterior and lateral dislocation of the radial
head associated with a mildly comminuted impaction fracture of the
anterior aspect of the radial head.  The capitellum appears intact.

The olecranon articulates with the trochlea.  However, there is a
comminuted fracture of the proximal ulnar diaphysis, just distal to
the coronoid process. There is probable proximal extension of a
nondisplaced fracture into the joint.  There is a large butterfly
fragment which is displaced anteriorly.  There is significant apex
dorsal angulation of this fracture.
IMPRESSION: 1.  Dorsal lateral dislocation of the radial head with associated
impaction fracture of the radial head.
2.  Comminuted angulated fracture of the proximal ulna with
possible proximal intra-articular extension.  No medial elbow
dislocation or humeral fracture identified.

## 2012-02-28 ENCOUNTER — Ambulatory Visit (HOSPITAL_BASED_OUTPATIENT_CLINIC_OR_DEPARTMENT_OTHER): Payer: BLUE CROSS/BLUE SHIELD | Admitting: Hematology & Oncology

## 2012-02-28 ENCOUNTER — Other Ambulatory Visit: Payer: BLUE CROSS/BLUE SHIELD | Admitting: Lab

## 2012-02-28 DIAGNOSIS — D509 Iron deficiency anemia, unspecified: Secondary | ICD-10-CM

## 2012-02-28 DIAGNOSIS — K922 Gastrointestinal hemorrhage, unspecified: Secondary | ICD-10-CM

## 2012-02-28 DIAGNOSIS — I10 Essential (primary) hypertension: Secondary | ICD-10-CM

## 2012-02-28 LAB — CBC WITH DIFFERENTIAL (CANCER CENTER ONLY)
BASO%: 0.5 % (ref 0.0–2.0)
EOS%: 2.6 % (ref 0.0–7.0)
HCT: 40.7 % (ref 34.8–46.6)
LYMPH#: 1.5 10*3/uL (ref 0.9–3.3)
LYMPH%: 35.3 % (ref 14.0–48.0)
MCHC: 32.9 g/dL (ref 32.0–36.0)
MCV: 83 fL (ref 81–101)
NEUT%: 51.5 % (ref 39.6–80.0)
RDW: 14.3 % (ref 11.1–15.7)

## 2012-02-28 NOTE — Progress Notes (Signed)
Diagnosis: Iron deficiency anemia #2 history of GI blood loss  Current therapy: IV iron as indicated  Interim history: Jamie Mcdowell is doing well. She still has high blood pressure issues. She will started taking her antihypertensives on a regular basis.  She was last seen back in November. Her ferritin was 49. Your iron saturation was 20%. It been about a year since she had IV iron. She's had no GI bleeding. She is on Prilosec.  Without difficulty. There's no arthralgias. She does not to ice. She has an occasional headache.  Physical exam this is a well developed well-nourished white female in no obvious distress. Her vital signs are temperature of 97 1 pulse 69 heart rate 16 blood pressure 170/112. Her head and neck exam shows no scleral icterus. Conjunctivae are pink. There is no oral lesions. There is no adenopathy in her neck. Her clear bilaterally. Cardiac exam regular rhythm with no murmurs symptoms. Abdominal exam soft good bowel sounds. There is a palpable abdominal mass. There is no fluid wave. There is no palpable hepatospleno megaly. Extremities shows no clubbing cyanosis or edema she has good range of motion of her joints.  Laboratory studies white cell count is 4.3 hemoglobin 13.4 hematocrit 40.7 platelet count 266. MCV is 83.  Impression: Jamie Mcdowell is a 51 year old white female with recurrent iron deficiency anemia. She has a history of GI bleeding. I suspect that she is not absorbing iron.  Her MCV was a dropping. I spell that she likely will need iron. Has been nauseated so she has done well.  Plan back to see [place] in another 3 months. Compatible with her iron studies.

## 2012-03-03 LAB — PROTEIN ELECTROPHORESIS, SERUM
Albumin ELP: 57.8 % (ref 55.8–66.1)
Alpha-1-Globulin: 4 % (ref 2.9–4.9)

## 2012-03-03 LAB — RETICULOCYTES (CHCC)
ABS Retic: 128.4 10*3/uL (ref 19.0–186.0)
RBC.: 4.94 MIL/uL (ref 3.87–5.11)

## 2012-03-10 ENCOUNTER — Other Ambulatory Visit: Payer: Self-pay | Admitting: *Deleted

## 2012-03-10 ENCOUNTER — Telehealth: Payer: Self-pay | Admitting: *Deleted

## 2012-03-10 DIAGNOSIS — D509 Iron deficiency anemia, unspecified: Secondary | ICD-10-CM

## 2012-03-10 NOTE — Telephone Encounter (Addendum)
Message copied by Wynonia Hazard on Mon Mar 10, 2012 12:47 PM ------      Message from: Arlan Organ R      Created: Mon Mar 03, 2012  6:50 PM       Call - iron is getting lower.  Need to set her up w/ Feraheme at 1020mg  in 1-2 wks.  Cindee Lame  On 03-05-12 spoke to the pt regarding having another Feraheme infusion. She was reluctant because she had a fall the day after the 1st one she had and wanted to think about this before agreeing to having another one. Explained that it would be ok to think about it and to call back when she was ready.  03-10-12 pt called back to say she wanted to have the iron infusion. She was offered something to take before she comes in but stated "I think I'm ok for now". Will have scheduling call her to set this up.

## 2012-03-14 ENCOUNTER — Other Ambulatory Visit: Payer: Self-pay | Admitting: *Deleted

## 2012-03-19 ENCOUNTER — Ambulatory Visit: Payer: Self-pay

## 2012-03-19 ENCOUNTER — Ambulatory Visit (HOSPITAL_BASED_OUTPATIENT_CLINIC_OR_DEPARTMENT_OTHER): Payer: BC Managed Care – PPO

## 2012-03-19 DIAGNOSIS — Z452 Encounter for adjustment and management of vascular access device: Secondary | ICD-10-CM

## 2012-03-19 DIAGNOSIS — D509 Iron deficiency anemia, unspecified: Secondary | ICD-10-CM

## 2012-03-19 MED ORDER — SODIUM CHLORIDE 0.9 % IV SOLN
1020.0000 mg | Freq: Once | INTRAVENOUS | Status: DC
  Filled 2012-03-19: qty 34

## 2012-03-19 MED ORDER — SODIUM CHLORIDE 0.9 % IV SOLN
Freq: Once | INTRAVENOUS | Status: AC
  Administered 2012-03-19: 16:00:00 via INTRAVENOUS

## 2012-03-19 MED ORDER — HEPARIN SOD (PORK) LOCK FLUSH 100 UNIT/ML IV SOLN
500.0000 [IU] | Freq: Once | INTRAVENOUS | Status: DC | PRN
  Filled 2012-03-19: qty 5

## 2012-03-19 MED ORDER — SODIUM CHLORIDE 0.9 % IJ SOLN
10.0000 mL | INTRAMUSCULAR | Status: DC | PRN
  Filled 2012-03-19: qty 10

## 2012-03-19 MED ORDER — HEPARIN SOD (PORK) LOCK FLUSH 100 UNIT/ML IV SOLN
250.0000 [IU] | Freq: Once | INTRAVENOUS | Status: DC | PRN
  Filled 2012-03-19: qty 5

## 2012-03-19 MED ORDER — SODIUM CHLORIDE 0.9 % IJ SOLN
3.0000 mL | Freq: Once | INTRAMUSCULAR | Status: DC | PRN
  Filled 2012-03-19: qty 10

## 2012-03-19 MED ORDER — ALTEPLASE 2 MG IJ SOLR
2.0000 mg | Freq: Once | INTRAMUSCULAR | Status: DC | PRN
  Filled 2012-03-19: qty 2

## 2012-03-20 ENCOUNTER — Telehealth: Payer: Self-pay | Admitting: Hematology & Oncology

## 2012-03-20 NOTE — Telephone Encounter (Signed)
Pt called wanted to know if she needed lab after iron infusion. Per MD patient doesn't need lab until June appointment. Pt aware.

## 2012-06-04 ENCOUNTER — Other Ambulatory Visit (HOSPITAL_BASED_OUTPATIENT_CLINIC_OR_DEPARTMENT_OTHER): Payer: BC Managed Care – PPO | Admitting: Lab

## 2012-06-04 ENCOUNTER — Ambulatory Visit (HOSPITAL_BASED_OUTPATIENT_CLINIC_OR_DEPARTMENT_OTHER): Payer: BC Managed Care – PPO | Admitting: Hematology & Oncology

## 2012-06-04 VITALS — BP 151/101 | HR 74 | Temp 97.2°F | Ht 64.0 in | Wt 192.0 lb

## 2012-06-04 DIAGNOSIS — D509 Iron deficiency anemia, unspecified: Secondary | ICD-10-CM

## 2012-06-04 DIAGNOSIS — K922 Gastrointestinal hemorrhage, unspecified: Secondary | ICD-10-CM

## 2012-06-04 LAB — CBC WITH DIFFERENTIAL (CANCER CENTER ONLY)
BASO%: 0.6 % (ref 0.0–2.0)
EOS%: 2.4 % (ref 0.0–7.0)
LYMPH#: 2 10*3/uL (ref 0.9–3.3)
LYMPH%: 39.6 % (ref 14.0–48.0)
MCHC: 34.4 g/dL (ref 32.0–36.0)
MCV: 86 fL (ref 81–101)
MONO#: 0.4 10*3/uL (ref 0.1–0.9)
Platelets: 261 10*3/uL (ref 145–400)
RDW: 14.5 % (ref 11.1–15.7)
WBC: 5.1 10*3/uL (ref 3.9–10.0)

## 2012-06-04 NOTE — Progress Notes (Signed)
This office note has been dictated.

## 2012-06-05 NOTE — Progress Notes (Signed)
CC:   [name], M.D. Willis Modena, MD Artist Pais, M.D.  DIAGNOSIS:  Iron-deficiency anemia secondary to intermittent gastrointestinal bleeding.  CURRENT THERAPY:  IV iron as indicated, patient last received Feraheme on 03/28/2012.  INTERIM HISTORY:  Ms. [place] comes in for followup.  She still feels quite tired.  This might be her medications.  I do not think that would be from iron deficiency again.  She has not noticed any melena or hematochezia.  We did give her 1020 mg of Feraheme back in April.  Her last iron studies showed a ferritin of only 15 back in March.  She is worried about her blood pressure.  Her blood pressure has been on the high side.  She was recently started on losartan.  She has had no menstrual cycles.  She has had no hemoptysis.  PHYSICAL EXAMINATION:  This is a well-developed, well-nourished white female in no obvious distress.  Vital signs:  Temperature of 97.6, pulse 74, respiratory rate 18, blood pressure 160/100.  Weight is 192.  Head and neck:  A normocephalic, atraumatic skull.  There are no ocular or oral lesions.  There are no palpable cervical or supraclavicular lymph nodes.  Lungs:  Clear bilaterally.  Cardiac:  Regular rate and rhythm with a normal S1 and S2.  There are no murmurs, rubs or bruits. Abdomen:  Soft with good bowel sounds.  There is no palpable abdominal mass.  There is no fluid wave.  There is no palpable hepatosplenomegaly. Back:  No tenderness over the spine, ribs, or hips.  Extremities:  No clubbing, cyanosis or edema.  LABORATORY STUDIES:  White cell count is 5.1, hemoglobin 14.7, hematocrit 42.7, platelet count 261.  MCV is 86.  IMPRESSION:  Ms. Brassfield is a 51 year old white female with recurrent iron- deficiency anemia.  She does have some gastrointestinal bleeding on occasion.  I am not sure as to why she is feeling so tired.  She may need to have her blood pressure medication changed.  We will plan to get  her back to see [place] in another 3 months.  Hopefully, she will be feeling better.   ______________________________ Josph Macho, M.D. PRE/MEDQ  D:  06/04/2012  T:  06/05/2012  Job:  [id number]

## 2012-06-07 LAB — IRON AND TIBC: TIBC: 302 ug/dL (ref 250–470)

## 2012-06-07 LAB — RETICULOCYTES (CHCC)
ABS Retic: 107.7 10*3/uL (ref 19.0–186.0)
RBC.: 5.13 MIL/uL — ABNORMAL HIGH (ref 3.87–5.11)
Retic Ct Pct: 2.1 % (ref 0.4–2.3)

## 2012-06-07 LAB — TRANSFERRIN RECEPTOR, SOLUABLE: Transferrin Receptor, Soluble: 2.15 mg/L — ABNORMAL HIGH (ref 0.76–1.76)

## 2012-06-07 LAB — FERRITIN: Ferritin: 130 ng/mL (ref 10–291)

## 2012-06-11 ENCOUNTER — Telehealth: Payer: Self-pay | Admitting: *Deleted

## 2012-06-11 NOTE — Telephone Encounter (Signed)
Message copied by Mirian Capuchin on Wed Jun 11, 2012  2:43 PM ------      Message from: Arlan Organ R      Created: Wed Jun 04, 2012  6:52 PM       Please call and tell her that her iron studies are excellent. She does not need any iron right now. Jamie Mcdowell

## 2012-06-11 NOTE — Telephone Encounter (Addendum)
Message copied by Mirian Capuchin on Wed Jun 11, 2012  2:47 PM ------      Message from: Arlan Organ R      Created: Wed Jun 04, 2012  6:52 PM       Please call and tell her that her iron studies are excellent. She does not need any iron right now. Cindee Lame This message given to pt.  Pt voiced understanding.

## 2012-09-02 ENCOUNTER — Ambulatory Visit (HOSPITAL_BASED_OUTPATIENT_CLINIC_OR_DEPARTMENT_OTHER): Payer: BC Managed Care – PPO | Admitting: Hematology & Oncology

## 2012-09-02 ENCOUNTER — Other Ambulatory Visit (HOSPITAL_BASED_OUTPATIENT_CLINIC_OR_DEPARTMENT_OTHER): Payer: BC Managed Care – PPO | Admitting: Lab

## 2012-09-02 VITALS — BP 155/103 | HR 73 | Temp 98.0°F | Resp 18 | Ht 64.0 in | Wt 195.0 lb

## 2012-09-02 DIAGNOSIS — D509 Iron deficiency anemia, unspecified: Secondary | ICD-10-CM

## 2012-09-02 LAB — CBC WITH DIFFERENTIAL (CANCER CENTER ONLY)
BASO%: 0.4 % (ref 0.0–2.0)
LYMPH#: 1.7 10*3/uL (ref 0.9–3.3)
MONO#: 0.4 10*3/uL (ref 0.1–0.9)
NEUT#: 2.3 10*3/uL (ref 1.5–6.5)
Platelets: 303 10*3/uL (ref 145–400)
RDW: 13.2 % (ref 11.1–15.7)
WBC: 4.5 10*3/uL (ref 3.9–10.0)

## 2012-09-02 LAB — CMP (CANCER CENTER ONLY)
ALT(SGPT): 17 U/L (ref 10–47)
AST: 18 U/L (ref 11–38)
Alkaline Phosphatase: 100 U/L — ABNORMAL HIGH (ref 26–84)
Creat: 0.6 mg/dl (ref 0.6–1.2)
Sodium: 141 mEq/L (ref 128–145)
Total Bilirubin: 0.7 mg/dl (ref 0.20–1.60)
Total Protein: 7.4 g/dL (ref 6.4–8.1)

## 2012-09-02 LAB — IRON AND TIBC
%SAT: 18 % — ABNORMAL LOW (ref 20–55)
Iron: 64 ug/dL (ref 42–145)
TIBC: 358 ug/dL (ref 250–470)

## 2012-09-02 LAB — FERRITIN: Ferritin: 26 ng/mL (ref 10–291)

## 2012-09-02 NOTE — Progress Notes (Signed)
This office note has been dictated.

## 2012-09-02 NOTE — Progress Notes (Signed)
CC:   Jamie Mcdowell, M.D. Jamie Modena, MD Jamie Mcdowell, M.D.  DIAGNOSIS:  Recurrent iron deficiency anemia.  CURRENT THERAPY:  IV iron, the patient last received Feraheme on April 5.  INTERIM HISTORY:  Jamie Mcdowell comes in for followup.  She has done quite well.  She last got iron back in April.  When we saw her back in June, her ferritin was 130 with iron saturation of 28%.  She does not have any menstrual cycle.  There was no obvious bleeding.  She is watching what she takes medicine wise.  Her blood pressure has been an issue for her.  She has been seeing Dr. Katrinka Blazing for this.  PHYSICAL EXAMINATION:  General:  This is a well-developed, well- nourished white female in no obvious distress.  Vital signs: Temperature of 98, pulse 73, respiratory rate 18, blood pressure 155/102, weight is 195.  Head and neck:  Normocephalic, atraumatic skull.  There are no ocular or oral lesions.  There are no palpable cervical or supraclavicular lymph nodes.  Lungs:  Clear bilaterally. Cardiac:  Regular rate and rhythm with a normal S1 and S2.  There are no murmurs, rubs or bruits.  Abdomen:  Soft with good bowel sounds.  There is no fluid wave.  There is no palpable hepatosplenomegaly.  Back:  No tenderness over the spine, ribs or hips.  Extremities:  Shows no clubbing, cyanosis or edema.  Neurological:  Shows no focal neurological deficits.  LABORATORY STUDIES:  White cell count is 4.5, hemoglobin 14, hematocrit 41, platelet count is 303.  Her BUN is 13, creatinine is 0.6.  Total protein 7.4.  IMPRESSION:  Jamie Mcdowell is a very charming 51 year old white female with recurrent iron deficiency anemia.  She did have a history of GI bleeding but this has basically resolved.  We will see what her iron studies are.  We will plan to give her iron if necessary.  We will then plan to get her back to see [place] in another 3 months.    ______________________________ Josph Macho, M.D. PRE/MEDQ   D:  09/02/2012  T:  09/02/2012  Job:  [id number]

## 2012-09-03 ENCOUNTER — Telehealth: Payer: Self-pay | Admitting: *Deleted

## 2012-09-03 ENCOUNTER — Other Ambulatory Visit: Payer: Self-pay | Admitting: *Deleted

## 2012-09-03 DIAGNOSIS — D509 Iron deficiency anemia, unspecified: Secondary | ICD-10-CM

## 2012-09-03 NOTE — Telephone Encounter (Signed)
Message copied by Anselm Jungling on Wed Sep 03, 2012  9:19 AM ------      Message from: Josph Macho      Created: Tue Sep 02, 2012  7:55 PM       Call -  Iron is dropping again.  Need 1020mg  Feraheme x 1 dose in 1-2 weeks.  PLease set up.  Thanks!!  pete

## 2012-09-03 NOTE — Telephone Encounter (Signed)
Called patient to let her know that her iron levels were dropping again.  Needs Feraheme 1020 mg x 1 dose.  Patient will call back to reschedule.  Patient teary after hearing this news.  Reassured her that this was all ok.

## 2012-09-08 ENCOUNTER — Ambulatory Visit (HOSPITAL_BASED_OUTPATIENT_CLINIC_OR_DEPARTMENT_OTHER): Payer: BC Managed Care – PPO

## 2012-09-08 DIAGNOSIS — D509 Iron deficiency anemia, unspecified: Secondary | ICD-10-CM

## 2012-09-08 MED ORDER — SODIUM CHLORIDE 0.9 % IV SOLN
1020.0000 mg | Freq: Once | INTRAVENOUS | Status: AC
  Administered 2012-09-08: 1020 mg via INTRAVENOUS
  Filled 2012-09-08: qty 34

## 2012-09-08 NOTE — Patient Instructions (Signed)
Ferumoxytol injection What is this medicine? FERUMOXYTOL is an iron complex. Iron is used to make healthy red blood cells, which carry oxygen and nutrients throughout the body. This medicine is used to treat iron deficiency anemia in people with chronic kidney disease. This medicine may be used for other purposes; ask your health care provider or pharmacist if you have questions. What should I tell my health care provider before I take this medicine? They need to know if you have any of these conditions: -anemia not caused by low iron levels -high levels of iron in the blood -magnetic resonance imaging (MRI) test scheduled -an unusual or allergic reaction to iron, other medicines, foods, dyes, or preservatives -pregnant or trying to get pregnant -breast-feeding How should I use this medicine? This medicine is for infusion into a vein. It is given by a health care professional in a hospital or clinic setting. Talk to your pediatrician regarding the use of this medicine in children. Special care may be needed. Overdosage: If you think you've taken too much of this medicine contact a poison control center or emergency room at once. Overdosage: If you think you have taken too much of this medicine contact a poison control center or emergency room at once. NOTE: This medicine is only for you. Do not share this medicine with others. What if I miss a dose? It is important not to miss your dose. Call your doctor or health care professional if you are unable to keep an appointment. What may interact with this medicine? This medicine may interact with the following medications: -other iron products This list may not describe all possible interactions. Give your health care provider a list of all the medicines, herbs, non-prescription drugs, or dietary supplements you use. Also tell them if you smoke, drink alcohol, or use illegal drugs. Some items may interact with your medicine. What should I watch  for while using this medicine? Visit your doctor or healthcare professional regularly. Tell your doctor or healthcare professional if your symptoms do not start to get better or if they get worse. You may need blood work done while you are taking this medicine. You may need to follow a special diet. Talk to your doctor. Foods that contain iron include: whole grains/cereals, dried fruits, beans, or peas, leafy green vegetables, and organ meats (liver, kidney). What side effects may I notice from receiving this medicine? Side effects that you should report to your doctor or health care professional as soon as possible: -allergic reactions like skin rash, itching or hives, swelling of the face, lips, or tongue -breathing problems -changes in blood pressure -feeling faint or lightheaded, falls -fever or chills -flushing, sweating, or hot feelings -swelling of the ankles or feet Side effects that usually do not require medical attention (Report these to your doctor or health care professional if they continue or are bothersome.): -diarrhea -headache -nausea, vomiting -stomach pain This list may not describe all possible side effects. Call your doctor for medical advice about side effects. You may report side effects to FDA at 1-800-FDA-1088. Where should I keep my medicine? This drug is given in a hospital or clinic and will not be stored at home. NOTE: This sheet is a summary. It may not cover all possible information. If you have questions about this medicine, talk to your doctor, pharmacist, or health care provider.  2012, [employer]. (09/01/2008 9:48:25 PM) 

## 2012-12-02 ENCOUNTER — Other Ambulatory Visit: Payer: Self-pay | Admitting: Lab

## 2012-12-02 ENCOUNTER — Ambulatory Visit: Payer: Self-pay | Admitting: Hematology & Oncology

## 2012-12-04 ENCOUNTER — Ambulatory Visit (HOSPITAL_BASED_OUTPATIENT_CLINIC_OR_DEPARTMENT_OTHER): Payer: BC Managed Care – PPO | Admitting: Medical

## 2012-12-04 ENCOUNTER — Other Ambulatory Visit (HOSPITAL_BASED_OUTPATIENT_CLINIC_OR_DEPARTMENT_OTHER): Payer: BC Managed Care – PPO | Admitting: Lab

## 2012-12-04 VITALS — BP 157/95 | HR 68 | Temp 97.8°F | Resp 16 | Ht 64.0 in | Wt 198.0 lb

## 2012-12-04 DIAGNOSIS — D509 Iron deficiency anemia, unspecified: Secondary | ICD-10-CM

## 2012-12-04 LAB — FERRITIN: Ferritin: 134 ng/mL (ref 10–291)

## 2012-12-04 LAB — CBC WITH DIFFERENTIAL (CANCER CENTER ONLY)
BASO%: 0.4 % (ref 0.0–2.0)
HCT: 42.7 % (ref 34.8–46.6)
LYMPH%: 34.9 % (ref 14.0–48.0)
MCH: 29.1 pg (ref 26.0–34.0)
MCV: 86 fL (ref 81–101)
MONO#: 0.4 10*3/uL (ref 0.1–0.9)
MONO%: 8.3 % (ref 0.0–13.0)
NEUT%: 54.6 % (ref 39.6–80.0)
Platelets: 264 10*3/uL (ref 145–400)
RDW: 14 % (ref 11.1–15.7)

## 2012-12-04 LAB — IRON AND TIBC
%SAT: 21 % (ref 20–55)
Iron: 68 ug/dL (ref 42–145)

## 2012-12-04 NOTE — Progress Notes (Signed)
Diagnosis: Recurrent iron deficiency anemia.  Current therapy: IV iron, the patient last received Feraheme on 09/08/2012.  Interim history: Jamie Mcdowell presents today for an office followup visit. She last received IV iron on 09/08/2012.  Her last iron panel back on 09/02/12 revealed an iron of 64 with 18% saturation.  Her Ferritin level was 26.  Her blood pressure still remains on the high side.  She does see Dr. Katrinka Blazing for this.  She does report some fatigue.  She denies any cravings for ice.  She does have some dyspnea on exertion on occasion.  She denies any dizziness or syncopal episodes.  She states she has a good appetite.  She denies any nausea, vomiting, diarrhea, constipation, chest pain, shortness of breath, cough, fevers, chills, or night sweats.  She denies any obvious, or abnormal bleeding.  She denies any headaches, visual changes, or rashes.  She denies any lower leg swelling.  Review of Systems: Constitutional:Negative for malaise/fatigue, fever, chills, weight loss, diaphoresis, activity change, appetite change, and unexpected weight change.  HEENT: Negative for double vision, blurred vision, visual loss, ear pain, tinnitus, congestion, rhinorrhea, epistaxis sore throat or sinus disease, oral pain/lesion, tongue soreness Respiratory: Negative for cough, chest tightness, shortness of breath, wheezing and stridor.  Cardiovascular: Negative for chest pain, palpitations, leg swelling, orthopnea, PND, DOE or claudication Gastrointestinal: Negative for nausea, vomiting, abdominal pain, diarrhea, constipation, blood in stool, melena, hematochezia, abdominal distention, anal bleeding, rectal pain, anorexia and hematemesis.  Genitourinary: Negative for dysuria, frequency, hematuria,  Musculoskeletal: Negative for myalgias, back pain, joint swelling, arthralgias and gait problem.  Skin: Negative for rash, color change, pallor and wound.  Neurological:. Negative for dizziness/light-headedness,  tremors, seizures, syncope, facial asymmetry, speech difficulty, weakness, numbness, headaches and paresthesias.  Hematological: Negative for adenopathy. Does not bruise/bleed easily.  Psychiatric/Behavioral:  Negative for depression, no loss of interest in normal activity or change in sleep pattern.   Physical Exam: This is a pleasant, 51 year, well-developed, well-nourished, white female, in no obvious distress Vitals: Temperature 97.8 degrees, pulse 60, respirations 16, blood pressure 157/95 HEENT reveals a normocephalic, atraumatic skull, no scleral icterus, no oral lesions  Neck is supple without any cervical or supraclavicular adenopathy.  Lungs are clear to auscultation bilaterally. There are no wheezes, rales or rhonci Cardiac is regular rate and rhythm with a normal S1 and S2. There are no murmurs, rubs, or bruits.  Abdomen is soft with good bowel sounds, there is no palpable mass. There is no palpable hepatosplenomegaly. There is no palpable fluid wave.  Musculoskeletal no tenderness of the spine, ribs, or hips.  Extremities there are no clubbing, cyanosis, or edema.  Skin no petechia, purpura or ecchymosis Neurologic is nonfocal.  Laboratory Data: White count 4.6, hemoglobin 14.4, hematocrit 42.7, platelets 264,000, MCV 86  Current Outpatient Prescriptions on File Prior to Visit  Medication Sig Dispense Refill  . acetaminophen (TYLENOL) 325 MG tablet Take 325 mg by mouth as needed.       Marland Kitchen Fexofenadine-Pseudoephedrine (ALLEGRA-D 24 HOUR PO) Take by mouth daily.        Marland Kitchen omeprazole (PRILOSEC) 10 MG capsule Take 20 mg by mouth daily.         Assessment/Plan: This is a pleasant, 51 year old, white female, with the following issues:  #1.  Recurrent iron deficiency anemia.  She did have a history of GI bleeding in the past, but this is now resolved.  Her blood looks good today.  I would be surprised if she needs IV  iron.  We will wait the results from her iron panel, and, if, in  fact she does need IV iron, we will call her.  #2.  Followup.  We will follow back up with Jamie Mcdowell in 3 months, but before then should there be questions or concerns.

## 2012-12-08 ENCOUNTER — Telehealth: Payer: Self-pay | Admitting: *Deleted

## 2012-12-08 NOTE — Telephone Encounter (Signed)
Called patient to let her know that her iron levels were good per d.r ennever

## 2012-12-08 NOTE — Telephone Encounter (Signed)
Message copied by Anselm Jungling on Mon Dec 08, 2012  1:42 PM ------      Message from: Josph Macho      Created: Sun Dec 07, 2012  8:49 PM       Call - iron is much better!!! Cindee Lame

## 2013-02-18 ENCOUNTER — Telehealth: Payer: Self-pay | Admitting: Hematology & Oncology

## 2013-02-18 NOTE — Telephone Encounter (Signed)
Pt aware moved 3-13 to 3-20

## 2013-03-03 ENCOUNTER — Other Ambulatory Visit: Payer: Self-pay | Admitting: Medical

## 2013-03-04 ENCOUNTER — Ambulatory Visit: Payer: Self-pay | Admitting: Hematology & Oncology

## 2013-03-04 ENCOUNTER — Other Ambulatory Visit: Payer: Self-pay | Admitting: Lab

## 2013-03-05 ENCOUNTER — Ambulatory Visit: Payer: Self-pay | Admitting: Hematology & Oncology

## 2013-03-05 ENCOUNTER — Other Ambulatory Visit: Payer: Self-pay | Admitting: Lab

## 2013-03-12 ENCOUNTER — Ambulatory Visit (HOSPITAL_BASED_OUTPATIENT_CLINIC_OR_DEPARTMENT_OTHER): Payer: BC Managed Care – PPO | Admitting: Medical

## 2013-03-12 ENCOUNTER — Other Ambulatory Visit (HOSPITAL_BASED_OUTPATIENT_CLINIC_OR_DEPARTMENT_OTHER): Payer: BC Managed Care – PPO | Admitting: Lab

## 2013-03-12 VITALS — BP 170/101 | HR 75 | Temp 98.3°F | Resp 16 | Ht 64.0 in | Wt 198.0 lb

## 2013-03-12 DIAGNOSIS — I10 Essential (primary) hypertension: Secondary | ICD-10-CM | POA: Insufficient documentation

## 2013-03-12 DIAGNOSIS — D509 Iron deficiency anemia, unspecified: Secondary | ICD-10-CM

## 2013-03-12 LAB — COMPREHENSIVE METABOLIC PANEL
AST: 14 U/L (ref 0–37)
Albumin: 4.5 g/dL (ref 3.5–5.2)
BUN: 14 mg/dL (ref 6–23)
Calcium: 10.3 mg/dL (ref 8.4–10.5)
Chloride: 106 mEq/L (ref 96–112)
Potassium: 4 mEq/L (ref 3.5–5.3)
Sodium: 143 mEq/L (ref 135–145)
Total Protein: 7.1 g/dL (ref 6.0–8.3)

## 2013-03-12 LAB — IRON AND TIBC: TIBC: 337 ug/dL (ref 250–470)

## 2013-03-12 LAB — CBC WITH DIFFERENTIAL (CANCER CENTER ONLY)
BASO%: 0.5 % (ref 0.0–2.0)
EOS%: 2.5 % (ref 0.0–7.0)
LYMPH%: 35.6 % (ref 14.0–48.0)
MCH: 28.4 pg (ref 26.0–34.0)
MCHC: 33.3 g/dL (ref 32.0–36.0)
MCV: 85 fL (ref 81–101)
MONO%: 6.9 % (ref 0.0–13.0)
NEUT#: 3.1 10*3/uL (ref 1.5–6.5)
Platelets: 300 10*3/uL (ref 145–400)
RBC: 5.1 10*6/uL (ref 3.70–5.32)
RDW: 13.6 % (ref 11.1–15.7)

## 2013-03-12 LAB — CHCC SATELLITE - SMEAR

## 2013-03-12 LAB — RETICULOCYTES (CHCC): ABS Retic: 118 10*3/uL (ref 19.0–186.0)

## 2013-03-12 NOTE — Progress Notes (Signed)
Diagnosis: Recurrent iron deficiency anemia.  Current therapy: IV iron, the patient last received Feraheme on 09/08/2012.  Interim history: Jamie Mcdowell presents today for an office followup visit. She last received IV iron on 09/08/2012.  Her last iron panel back on 12/12/2012 revealed an iron of 68 with 21% saturation.  Her Ferritin level was 134.  Her blood pressure still remains on the high side.  She does have blood pressure medication, however, she, states, that it makes her feel bad, and she doesn't take it.  She does see Dr. Katrinka Blazing for this.  I did explain to her that her blood pressure was extremely elevated today.  I did discuss the repercussions from this especially, if she is not taking her medication.  She's not reporting any excessive fatigue or weakness.  She denies any cravings for ice.  She does have some dyspnea on exertion on occasion.  She denies any dizziness or syncopal episodes.  She states she has a good appetite.  She denies any nausea, vomiting, diarrhea, constipation, chest pain, shortness of breath, cough, fevers, chills, or night sweats.  She denies any obvious, or abnormal bleeding.  She denies any headaches, visual changes, or rashes.  She denies any lower leg swelling.  Review of Systems: Constitutional:Negative for malaise/fatigue, fever, chills, weight loss, diaphoresis, activity change, appetite change, and unexpected weight change.  HEENT: Negative for double vision, blurred vision, visual loss, ear pain, tinnitus, congestion, rhinorrhea, epistaxis sore throat or sinus disease, oral pain/lesion, tongue soreness Respiratory: Negative for cough, chest tightness, shortness of breath, wheezing and stridor.  Cardiovascular: Negative for chest pain, palpitations, leg swelling, orthopnea, PND, DOE or claudication Gastrointestinal: Negative for nausea, vomiting, abdominal pain, diarrhea, constipation, blood in stool, melena, hematochezia, abdominal distention, anal bleeding, rectal  pain, anorexia and hematemesis.  Genitourinary: Negative for dysuria, frequency, hematuria,  Musculoskeletal: Negative for myalgias, back pain, joint swelling, arthralgias and gait problem.  Skin: Negative for rash, color change, pallor and wound.  Neurological:. Negative for dizziness/light-headedness, tremors, seizures, syncope, facial asymmetry, speech difficulty, weakness, numbness, headaches and paresthesias.  Hematological: Negative for adenopathy. Does not bruise/bleed easily.  Psychiatric/Behavioral:  Negative for depression, no loss of interest in normal activity or change in sleep pattern.   Physical Exam: This is a pleasant, 51 year, well-developed, well-nourished, white female, in no obvious distress Vitals: Temperature 98.3 degrees, pulse 75, respirations 18, blood pressure 170/101, weight 198 pounds HEENT reveals a normocephalic, atraumatic skull, no scleral icterus, no oral lesions  Neck is supple without any cervical or supraclavicular adenopathy.  Lungs are clear to auscultation bilaterally. There are no wheezes, rales or rhonci Cardiac is regular rate and rhythm with a normal S1 and S2. There are no murmurs, rubs, or bruits.  Abdomen is soft with good bowel sounds, there is no palpable mass. There is no palpable hepatosplenomegaly. There is no palpable fluid wave.  Musculoskeletal no tenderness of the spine, ribs, or hips.  Extremities there are no clubbing, cyanosis, or edema.  Skin no petechia, purpura or ecchymosis Neurologic is nonfocal.  Laboratory Data: White count 5.7, hemoglobin 14.5, hematocrit 43.5.  Platelets 300,000  Current Outpatient Prescriptions on File Prior to Visit  Medication Sig Dispense Refill  . acetaminophen (TYLENOL) 325 MG tablet Take 325 mg by mouth as needed.       Marland Kitchen Fexofenadine-Pseudoephedrine (ALLEGRA-D 24 HOUR PO) Take by mouth daily.        Marland Kitchen omeprazole (PRILOSEC) 10 MG capsule Take 20 mg by mouth daily.  No current  facility-administered medications on file prior to visit.    Assessment/Plan: This is a pleasant, 52 year old, white female, with the following issues:  #1.  Recurrent iron deficiency anemia.  She did have a history of GI bleeding in the past, but this is now resolved.  Her blood looks good today.  I would be surprised if she needs IV iron.  We will wait the results from her iron panel, and, if, in fact she does need IV iron, we will call her.  #2.  Hypertension.  Again, she does see Dr. Katrinka Blazing for this.  She states, her blood pressure medicine makes her feel bad,as such., she just does not take it.  I did discuss the possibility of TIA/CVA, secondary to her high blood pressure.  #3.  Followup.  We will follow back up with Jamie Mcdowell in 3 months, but before then should there be questions or concerns.

## 2013-03-18 ENCOUNTER — Telehealth: Payer: Self-pay | Admitting: Oncology

## 2013-03-18 NOTE — Telephone Encounter (Addendum)
Message copied by Lacie Draft on Wed Mar 18, 2013 12:25 PM ------      Message from: Josph Macho      Created: Sun Mar 15, 2013  2:14 PM       Call - iron is a little lower but no need for iron treatment!  Pete  Left message on voicemail.Teola Bradley, Kammi Hechler [employer]

## 2013-04-27 ENCOUNTER — Other Ambulatory Visit: Payer: Self-pay | Admitting: *Deleted

## 2013-04-27 DIAGNOSIS — D509 Iron deficiency anemia, unspecified: Secondary | ICD-10-CM

## 2013-04-27 NOTE — Progress Notes (Signed)
Pt called with c/o feeling tired and lightheaded when she comes up from a bending position. She is wondering if her iron has dropped since we last checked it. Inquired about her BP. Last time she was here it was elevated. She has not had it re-checked and is still not taking her BP medications. Denied any headaches, blurred vision, or unilateral weakness. Will have send an a request to scheduling to have her set up for another lab check this week. She will also call Dr Katrinka Blazing to update her on her blood pressure issues.

## 2013-04-28 ENCOUNTER — Other Ambulatory Visit (HOSPITAL_BASED_OUTPATIENT_CLINIC_OR_DEPARTMENT_OTHER): Payer: BC Managed Care – PPO | Admitting: Lab

## 2013-04-28 DIAGNOSIS — D509 Iron deficiency anemia, unspecified: Secondary | ICD-10-CM

## 2013-04-28 LAB — CBC WITH DIFFERENTIAL (CANCER CENTER ONLY)
BASO#: 0 10*3/uL (ref 0.0–0.2)
Eosinophils Absolute: 0.1 10*3/uL (ref 0.0–0.5)
HCT: 42.1 % (ref 34.8–46.6)
HGB: 13.7 g/dL (ref 11.6–15.9)
LYMPH%: 37.9 % (ref 14.0–48.0)
MCH: 27.8 pg (ref 26.0–34.0)
MCV: 85 fL (ref 81–101)
MONO#: 0.5 10*3/uL (ref 0.1–0.9)
MONO%: 8.5 % (ref 0.0–13.0)
RBC: 4.93 10*6/uL (ref 3.70–5.32)

## 2013-04-28 LAB — IRON AND TIBC
%SAT: 14 % — ABNORMAL LOW (ref 20–55)
UIBC: 286 ug/dL (ref 125–400)

## 2013-04-28 LAB — FERRITIN: Ferritin: 49 ng/mL (ref 10–291)

## 2013-05-01 ENCOUNTER — Telehealth: Payer: Self-pay | Admitting: Oncology

## 2013-05-01 ENCOUNTER — Other Ambulatory Visit: Payer: Self-pay | Admitting: Oncology

## 2013-05-01 DIAGNOSIS — D509 Iron deficiency anemia, unspecified: Secondary | ICD-10-CM

## 2013-05-01 NOTE — Telephone Encounter (Addendum)
Message copied by Lacie Draft on Fri May 01, 2013  4:10 PM ------      Message from: Arlan Organ R      Created: Wed Apr 29, 2013  1:12 PM       Call - iron is low!!  We can do Feraheme 1020mg  x 1 dose at any time!!  Cindee Lame ------Left message with on patient's mobile phone, sent message to [place] and placed orders in signed and held.

## 2013-05-04 ENCOUNTER — Ambulatory Visit (HOSPITAL_BASED_OUTPATIENT_CLINIC_OR_DEPARTMENT_OTHER): Payer: BC Managed Care – PPO

## 2013-05-04 VITALS — BP 148/97 | HR 75 | Temp 97.0°F | Resp 18

## 2013-05-04 DIAGNOSIS — D509 Iron deficiency anemia, unspecified: Secondary | ICD-10-CM

## 2013-05-04 MED ORDER — SODIUM CHLORIDE 0.9 % IV SOLN
Freq: Once | INTRAVENOUS | Status: AC
  Administered 2013-05-04: 14:00:00 via INTRAVENOUS

## 2013-05-04 MED ORDER — SODIUM CHLORIDE 0.9 % IV SOLN
1020.0000 mg | Freq: Once | INTRAVENOUS | Status: AC
  Administered 2013-05-04: 1020 mg via INTRAVENOUS
  Filled 2013-05-04: qty 34

## 2013-05-04 NOTE — Patient Instructions (Addendum)
Ferumoxytol injection What is this medicine? FERUMOXYTOL is an iron complex. Iron is used to make healthy red blood cells, which carry oxygen and nutrients throughout the body. This medicine is used to treat iron deficiency anemia in people with chronic kidney disease. This medicine may be used for other purposes; ask your health care provider or pharmacist if you have questions. What should I tell my health care provider before I take this medicine? They need to know if you have any of these conditions: -anemia not caused by low iron levels -high levels of iron in the blood -magnetic resonance imaging (MRI) test scheduled -an unusual or allergic reaction to iron, other medicines, foods, dyes, or preservatives -pregnant or trying to get pregnant -breast-feeding How should I use this medicine? This medicine is for infusion into a vein. It is given by a health care professional in a hospital or clinic setting. Talk to your pediatrician regarding the use of this medicine in children. Special care may be needed. Overdosage: If you think you've taken too much of this medicine contact a poison control center or emergency room at once. Overdosage: If you think you have taken too much of this medicine contact a poison control center or emergency room at once. NOTE: This medicine is only for you. Do not share this medicine with others. What if I miss a dose? It is important not to miss your dose. Call your doctor or health care professional if you are unable to keep an appointment. What may interact with this medicine? This medicine may interact with the following medications: -other iron products This list may not describe all possible interactions. Give your health care provider a list of all the medicines, herbs, non-prescription drugs, or dietary supplements you use. Also tell them if you smoke, drink alcohol, or use illegal drugs. Some items may interact with your medicine. What should I watch  for while using this medicine? Visit your doctor or healthcare professional regularly. Tell your doctor or healthcare professional if your symptoms do not start to get better or if they get worse. You may need blood work done while you are taking this medicine. You may need to follow a special diet. Talk to your doctor. Foods that contain iron include: whole grains/cereals, dried fruits, beans, or peas, leafy green vegetables, and organ meats (liver, kidney). What side effects may I notice from receiving this medicine? Side effects that you should report to your doctor or health care professional as soon as possible: -allergic reactions like skin rash, itching or hives, swelling of the face, lips, or tongue -breathing problems -changes in blood pressure -feeling faint or lightheaded, falls -fever or chills -flushing, sweating, or hot feelings -swelling of the ankles or feet Side effects that usually do not require medical attention (Report these to your doctor or health care professional if they continue or are bothersome.): -diarrhea -headache -nausea, vomiting -stomach pain This list may not describe all possible side effects. Call your doctor for medical advice about side effects. You may report side effects to FDA at 1-800-FDA-1088. Where should I keep my medicine? This drug is given in a hospital or clinic and will not be stored at home. NOTE: This sheet is a summary. It may not cover all possible information. If you have questions about this medicine, talk to your doctor, pharmacist, or health care provider.  2012, [employer] Standard. (09/01/2008 9:48:25 PM) 

## 2013-06-01 ENCOUNTER — Telehealth: Payer: Self-pay | Admitting: Hematology & Oncology

## 2013-06-01 NOTE — Telephone Encounter (Signed)
Left pt message moved time of 6-20 appointment

## 2013-06-12 ENCOUNTER — Other Ambulatory Visit (HOSPITAL_BASED_OUTPATIENT_CLINIC_OR_DEPARTMENT_OTHER): Payer: BC Managed Care – PPO | Admitting: Lab

## 2013-06-12 ENCOUNTER — Ambulatory Visit (HOSPITAL_BASED_OUTPATIENT_CLINIC_OR_DEPARTMENT_OTHER): Payer: BC Managed Care – PPO | Admitting: Medical

## 2013-06-12 VITALS — BP 151/100 | HR 80 | Temp 98.0°F | Resp 16 | Ht 64.0 in | Wt 193.0 lb

## 2013-06-12 DIAGNOSIS — D509 Iron deficiency anemia, unspecified: Secondary | ICD-10-CM

## 2013-06-12 LAB — CBC WITH DIFFERENTIAL (CANCER CENTER ONLY)
BASO%: 0.7 % (ref 0.0–2.0)
EOS%: 1.4 % (ref 0.0–7.0)
HCT: 42 % (ref 34.8–46.6)
HGB: 14 g/dL (ref 11.6–15.9)
LYMPH#: 1.8 10*3/uL (ref 0.9–3.3)
MONO#: 0.4 10*3/uL (ref 0.1–0.9)
NEUT#: 2.1 10*3/uL (ref 1.5–6.5)
NEUT%: 47.9 % (ref 39.6–80.0)
RDW: 14.9 % (ref 11.1–15.7)
WBC: 4.4 10*3/uL (ref 3.9–10.0)

## 2013-06-12 LAB — IRON AND TIBC
Iron: 105 ug/dL (ref 42–145)
TIBC: 251 ug/dL (ref 250–470)
UIBC: 146 ug/dL (ref 125–400)

## 2013-06-12 NOTE — Progress Notes (Signed)
Diagnosis: Recurrent iron deficiency anemia.  Current therapy: IV iron, the patient last received Feraheme on 05/04/2013.  Interim history: Jamie Mcdowell presents today for an office followup visit. She last received IV iron on 05/04/2013.  Her last iron panel back in May revealed an iron of 45 with 14% saturation.  Her Ferritin level was 49.  Her blood pressure still remains on the high side.  She does have blood pressure medication, however, she, states, that it makes her feel bad, and she doesn't take it.  She does see Dr. Katrinka Blazing for this.  I did explain to her that her blood pressure was extremely elevated today.  I did discuss the repercussions from this especially, if she is not taking her medication.  She's not reporting any excessive fatigue or weakness.  She denies any cravings for ice.  She does have some dyspnea on exertion on occasion.  She denies any dizziness or syncopal episodes.  She states she has a good appetite.  She denies any nausea, vomiting, diarrhea, constipation, chest pain, shortness of breath, cough, fevers, chills, or night sweats.  She denies any obvious, or abnormal bleeding.  She denies any headaches, visual changes, or rashes.  She denies any lower leg swelling.  Review of Systems: Constitutional:Negative for malaise/fatigue, fever, chills, weight loss, diaphoresis, activity change, appetite change, and unexpected weight change.  HEENT: Negative for double vision, blurred vision, visual loss, ear pain, tinnitus, congestion, rhinorrhea, epistaxis sore throat or sinus disease, oral pain/lesion, tongue soreness Respiratory: Negative for cough, chest tightness, shortness of breath, wheezing and stridor.  Cardiovascular: Negative for chest pain, palpitations, leg swelling, orthopnea, PND, DOE or claudication Gastrointestinal: Negative for nausea, vomiting, abdominal pain, diarrhea, constipation, blood in stool, melena, hematochezia, abdominal distention, anal bleeding, rectal pain,  anorexia and hematemesis.  Genitourinary: Negative for dysuria, frequency, hematuria,  Musculoskeletal: Negative for myalgias, back pain, joint swelling, arthralgias and gait problem.  Skin: Negative for rash, color change, pallor and wound.  Neurological:. Negative for dizziness/light-headedness, tremors, seizures, syncope, facial asymmetry, speech difficulty, weakness, numbness, headaches and paresthesias.  Hematological: Negative for adenopathy. Does not bruise/bleed easily.  Psychiatric/Behavioral:  Negative for depression, no loss of interest in normal activity or change in sleep pattern.   Physical Exam: This is a pleasant, 51 year, well-developed, well-nourished, white female, in no obvious distress Vitals: Temperature 98.0 degrees pulse 80 respirations 16 blood pressure 151/100 weight 193 pounds HEENT reveals a normocephalic, atraumatic skull, no scleral icterus, no oral lesions  Neck is supple without any cervical or supraclavicular adenopathy.  Lungs are clear to auscultation bilaterally. There are no wheezes, rales or rhonci Cardiac is regular rate and rhythm with a normal S1 and S2. There are no murmurs, rubs, or bruits.  Abdomen is soft with good bowel sounds, there is no palpable mass. There is no palpable hepatosplenomegaly. There is no palpable fluid wave.  Musculoskeletal no tenderness of the spine, ribs, or hips.  Extremities there are no clubbing, cyanosis, or edema.  Skin no petechia, purpura or ecchymosis Neurologic is nonfocal.  Laboratory Data: White count 4.4 hemoglobin 14.0 hematocrit 42.0 platelets 241,000  Current Outpatient Prescriptions on File Prior to Visit  Medication Sig Dispense Refill  . acetaminophen (TYLENOL) 325 MG tablet Take 325 mg by mouth as needed.       Marland Kitchen Fexofenadine-Pseudoephedrine (ALLEGRA-D 24 HOUR PO) Take by mouth daily.        Marland Kitchen omeprazole (PRILOSEC) 10 MG capsule Take 20 mg by mouth daily.  No current facility-administered  medications on file prior to visit.    Assessment/Plan: This is a pleasant, 52 year old, white female, with the following issues:  #1.  Recurrent iron deficiency anemia.  She did have a history of GI bleeding in the past, but this is now resolved.  Her blood looks good today.  She received IV iron back in May.    #2.  Hypertension.  Again, she does see Dr. Katrinka Blazing for this.  She states, her blood pressure medicine makes her feel bad,as such., she just does not take it.  I did discuss the possibility of TIA/CVA, secondary to her high blood pressure.  #3.  Followup.  We will follow back up with [name] in 3 months, but before then should there be questions or concerns.

## 2013-09-10 ENCOUNTER — Other Ambulatory Visit (HOSPITAL_BASED_OUTPATIENT_CLINIC_OR_DEPARTMENT_OTHER): Payer: BC Managed Care – PPO | Admitting: Lab

## 2013-09-10 ENCOUNTER — Ambulatory Visit (HOSPITAL_BASED_OUTPATIENT_CLINIC_OR_DEPARTMENT_OTHER): Payer: BC Managed Care – PPO | Admitting: Hematology & Oncology

## 2013-09-10 VITALS — BP 136/91 | HR 73 | Temp 98.0°F | Resp 16 | Ht 64.0 in | Wt 192.0 lb

## 2013-09-10 DIAGNOSIS — D509 Iron deficiency anemia, unspecified: Secondary | ICD-10-CM

## 2013-09-10 LAB — CBC WITH DIFFERENTIAL (CANCER CENTER ONLY)
BASO#: 0 10*3/uL (ref 0.0–0.2)
EOS%: 1.8 % (ref 0.0–7.0)
Eosinophils Absolute: 0.1 10*3/uL (ref 0.0–0.5)
LYMPH%: 31.7 % (ref 14.0–48.0)
MCH: 29.1 pg (ref 26.0–34.0)
MCHC: 33 g/dL (ref 32.0–36.0)
MCV: 88 fL (ref 81–101)
MONO%: 8.3 % (ref 0.0–13.0)
Platelets: 266 10*3/uL (ref 145–400)
RBC: 4.77 10*6/uL (ref 3.70–5.32)

## 2013-09-10 NOTE — Progress Notes (Signed)
This office note has been dictated.

## 2013-09-11 LAB — IRON AND TIBC CHCC: %SAT: 21 % (ref 21–57)

## 2013-09-14 ENCOUNTER — Telehealth: Payer: Self-pay | Admitting: *Deleted

## 2013-09-14 NOTE — Telephone Encounter (Signed)
Called patient on personal cell phone and left message that her iron levels were still good per dr. Myna Hidalgo.

## 2013-09-22 NOTE — Progress Notes (Signed)
CC:   [name], M.D.  DIAGNOSIS:  Recurrent iron-deficiency anemia.  CURRENT THERAPY:  IV iron as indicated.  INTERIM HISTORY:  Jamie Mcdowell comes in for her followup.  She is doing okay.  She is starting to feel a little bit more tired.  She last got iron back in May when her ferritin was 49 with an iron saturation of 14%.  She always responds to iron.  I just think that she does not absorb iron well.  She has had no bleeding.  She has not had a monthly cycle since she was 52 years old.  She has had no fevers, sweats, or chills.  She has had no leg swelling. There have been no rashes.  There has been no cough or shortness of breath.  Her last mammogram that we have on her was 3 years ago.  She has not seen a gynecologist for 3 years.  I recommended that she see Leda Quail.  PHYSICAL EXAMINATION:  General:  This is a well-developed, well- nourished white female in no obvious distress.  Vital signs: Temperature of 98, pulse 73, respiratory rate 16, blood pressure 136/91. Weight is 192.  Head and neck:  Normocephalic, atraumatic skull.  There are no ocular or oral lesions.  There is no scleral icterus. Conjunctivae are slightly pale.  There is no adenopathy in the neck. Lungs:  Clear to percussion and auscultation bilaterally.  Cardiac: Regular rate and rhythm with a normal S1 and S2.  There are no murmurs, rubs or bruits.  Abdomen:  Soft.  She has good bowel sounds.  There is no palpable abdominal mass.  There is no palpable hepatosplenomegaly. Extremities.  No clubbing, cyanosis or edema.  Neurologic:  No focal neurological deficits.  LABORATORY STUDIES:  White cell count is 5.6, hemoglobin 13.9, hematocrit 42.1, platelet count 266.  MCV is 88.  On her peripheral smear, she has good size of her red cells.  She has normochromic, normocytic red cells.  I do not see any hypochromic red cells.  There is good maturation of her white blood cells.  She has no blasts.   Platelets are adequate in number and size.  I see no target cells.  IMPRESSION:  Jamie Mcdowell is a very charming 52 year old white female with history of recurrent iron-deficiency anemia.  Again, I just do not think she really absorbs iron.  We will see what her iron studies look like.  Again, she feels tired. It is certainly possible that her iron might be low despite her not being anemic.  However, I would think that the MCV being 88 would be a good indicator for her.  We will plan to get her back in another 3 months.    ______________________________ Josph Macho, M.D. PRE/MEDQ  D:  09/10/2013  T:  09/22/2013  Job:  [id number]

## 2013-10-27 ENCOUNTER — Ambulatory Visit (HOSPITAL_BASED_OUTPATIENT_CLINIC_OR_DEPARTMENT_OTHER): Payer: BC Managed Care – PPO | Admitting: Lab

## 2013-10-27 DIAGNOSIS — D509 Iron deficiency anemia, unspecified: Secondary | ICD-10-CM

## 2013-10-27 LAB — CBC WITH DIFFERENTIAL (CANCER CENTER ONLY)
BASO%: 0.4 % (ref 0.0–2.0)
EOS%: 1.5 % (ref 0.0–7.0)
Eosinophils Absolute: 0.1 10*3/uL (ref 0.0–0.5)
HCT: 41.9 % (ref 34.8–46.6)
LYMPH#: 1.6 10*3/uL (ref 0.9–3.3)
MCHC: 32.7 g/dL (ref 32.0–36.0)
MONO#: 0.3 10*3/uL (ref 0.1–0.9)
NEUT#: 2.8 10*3/uL (ref 1.5–6.5)
Platelets: 293 10*3/uL (ref 145–400)
RBC: 4.9 10*6/uL (ref 3.70–5.32)
RDW: 13.5 % (ref 11.1–15.7)
WBC: 4.8 10*3/uL (ref 3.9–10.0)

## 2013-10-27 LAB — IRON AND TIBC CHCC
Iron: 51 ug/dL (ref 41–142)
TIBC: 306 ug/dL (ref 236–444)
UIBC: 255 ug/dL (ref 120–384)

## 2013-10-27 LAB — FERRITIN CHCC: Ferritin: 51 ng/ml (ref 9–269)

## 2013-10-29 ENCOUNTER — Telehealth: Payer: Self-pay | Admitting: *Deleted

## 2013-10-29 NOTE — Telephone Encounter (Addendum)
Message copied by Mirian Capuchin on Thu Oct 29, 2013 12:47 PM ------      Message from: Arlan Organ R      Created: Wed Oct 28, 2013  6:32 PM       Call - iron is slowly dropping!  Need Feraheme 1020mg  x 1 dose!!  Please set up!!  Pete ------This message given to pt.  She will schedule an appt to have this done.

## 2013-10-30 ENCOUNTER — Telehealth: Payer: Self-pay | Admitting: Hematology & Oncology

## 2013-10-30 NOTE — Telephone Encounter (Signed)
Pt called wanting iron today. Per RN pt aware we can see between 11 and 1 she cant come will keep 11-10 appointment

## 2013-11-02 ENCOUNTER — Ambulatory Visit (HOSPITAL_BASED_OUTPATIENT_CLINIC_OR_DEPARTMENT_OTHER): Payer: BC Managed Care – PPO

## 2013-11-02 VITALS — BP 152/106 | HR 66 | Temp 96.9°F | Resp 18

## 2013-11-02 DIAGNOSIS — D509 Iron deficiency anemia, unspecified: Secondary | ICD-10-CM

## 2013-11-02 MED ORDER — FAMOTIDINE IN NACL 20-0.9 MG/50ML-% IV SOLN
20.0000 mg | Freq: Once | INTRAVENOUS | Status: AC
  Administered 2013-11-02: 20 mg via INTRAVENOUS

## 2013-11-02 MED ORDER — SODIUM CHLORIDE 0.9 % IV SOLN
1020.0000 mg | Freq: Once | INTRAVENOUS | Status: AC
  Administered 2013-11-02: 1020 mg via INTRAVENOUS
  Filled 2013-11-02: qty 34

## 2013-11-02 MED ORDER — METHYLPREDNISOLONE SODIUM SUCC 125 MG IJ SOLR
60.0000 mg | Freq: Once | INTRAMUSCULAR | Status: AC
  Administered 2013-11-02: 60 mg via INTRAVENOUS

## 2013-11-02 NOTE — Patient Instructions (Signed)

## 2013-11-16 ENCOUNTER — Other Ambulatory Visit: Payer: Self-pay | Admitting: Family Medicine

## 2013-11-16 ENCOUNTER — Other Ambulatory Visit (HOSPITAL_COMMUNITY)
Admission: RE | Admit: 2013-11-16 | Discharge: 2013-11-16 | Disposition: A | Discharge status: Home / Self Care | Payer: BC Managed Care – PPO | Source: Ambulatory Visit | Attending: Family Medicine | Admitting: Family Medicine

## 2013-11-16 DIAGNOSIS — Z124 Encounter for screening for malignant neoplasm of cervix: Secondary | ICD-10-CM | POA: Insufficient documentation

## 2013-11-30 NOTE — Progress Notes (Signed)
CC:   Jamie Mcdowell, M.D.  DIAGNOSIS:  Recurrent iron-deficiency anemia.  CURRENT THERAPY:  IV iron as indicated.  INTERIM HISTORY:  Jamie Mcdowell comes in for followup.  She is doing okay. She is starting to feel a little more tired.  She last got her iron back in May when her ferritin was 49 with an iron saturation of 14%.  She always responds to iron.  I just think that she does not absorb iron well.  She has had no bleeding.  She has not had her menstrual cycle since she was 52 years old.  She has had no fevers, sweats, or chills.  She has had no leg swelling. There have been no rashes.  There has been no cough or shortness of breath.  Her last mammogram that we have on her was 3 years ago.  She has not seen any gynecologist for 3 years.  I recommended that she see Dr. Leda Mcdowell.  PHYSICAL EXAMINATION:  General:  This is a well-developed, well- nourished white female, in no obvious distress.  Vital Signs: Temperature of 98, pulse 73, respiratory rate 16, blood pressure 136/91. Weight is 192.  Head and Neck:  Normocephalic, atraumatic skull.  There are no ocular or oral lesions.  There is no scleral icterus.  Her conjunctivae are slightly pale.  There is no adenopathy in the neck. Lungs:  Clear to percussion and auscultation bilaterally.  Cardiac: Regular rate and rhythm with a normal S1 and S2.  There are no murmurs, rubs, or bruits.  Abdomen:  Soft.  She has good bowel sounds.  There is no palpable abdominal mass.  There is no palpable hepatosplenomegaly. Extremities:  No clubbing, cyanosis, or edema.  Neurologic:  No focal neurologic deficits.  LABORATORY STUDIES:  White cell count is 5.6, hemoglobin 13.9, hematocrit 42.1, platelet count 266.  MCV is 88.  Her peripheral smear shows good size of her red cells.  She has normochromic, normocytic red cells.  I do not see any hypochromic red cells.  There is good maturation of her white blood cells.  She has no blasts.   Platelets were adequate in number and size.  I see no target cells.  IMPRESSION:  Jamie Mcdowell is a very charming 52 year old white female with history of recurrent iron-deficiency anemia.  Again, I just do not think she really absorbs iron.  We will see what her iron studies look like.  Again, she feels tired. It is certainly possible that her iron might be low despite her not being anemic.  However, I would think that the MCV being 88 would be a good indicator for her.  We will plan to get her back in another 3 months.    ______________________________ Josph Macho, M.D. PRE/MEDQ  D:  09/10/2013  T:  11/29/2013  Job:  [id number]

## 2013-12-09 ENCOUNTER — Telehealth: Payer: Self-pay | Admitting: Hematology & Oncology

## 2013-12-09 NOTE — Telephone Encounter (Signed)
Pt rescheduled to 1-27 will call if feeling bad between times

## 2013-12-09 NOTE — Telephone Encounter (Signed)
Pt left message cx 12-18 she is sick. I left her message to call and reschedule

## 2013-12-10 ENCOUNTER — Other Ambulatory Visit: Payer: Self-pay | Admitting: Lab

## 2013-12-10 ENCOUNTER — Ambulatory Visit: Payer: Self-pay | Admitting: Hematology & Oncology

## 2014-01-19 ENCOUNTER — Encounter: Payer: Self-pay | Admitting: Hematology & Oncology

## 2014-01-19 ENCOUNTER — Ambulatory Visit (HOSPITAL_BASED_OUTPATIENT_CLINIC_OR_DEPARTMENT_OTHER): Payer: BC Managed Care – PPO | Admitting: Hematology & Oncology

## 2014-01-19 ENCOUNTER — Other Ambulatory Visit: Payer: Self-pay | Admitting: Hematology & Oncology

## 2014-01-19 ENCOUNTER — Other Ambulatory Visit (HOSPITAL_BASED_OUTPATIENT_CLINIC_OR_DEPARTMENT_OTHER): Payer: BC Managed Care – PPO | Admitting: Lab

## 2014-01-19 VITALS — BP 151/86 | HR 74 | Temp 98.2°F | Resp 14 | Ht 64.0 in | Wt 186.0 lb

## 2014-01-19 DIAGNOSIS — R5381 Other malaise: Secondary | ICD-10-CM

## 2014-01-19 DIAGNOSIS — Z1231 Encounter for screening mammogram for malignant neoplasm of breast: Secondary | ICD-10-CM

## 2014-01-19 DIAGNOSIS — D509 Iron deficiency anemia, unspecified: Secondary | ICD-10-CM

## 2014-01-19 DIAGNOSIS — D510 Vitamin B12 deficiency anemia due to intrinsic factor deficiency: Secondary | ICD-10-CM

## 2014-01-19 DIAGNOSIS — R5383 Other fatigue: Secondary | ICD-10-CM

## 2014-01-19 LAB — CBC WITH DIFFERENTIAL (CANCER CENTER ONLY)
BASO#: 0 10*3/uL (ref 0.0–0.2)
BASO%: 0.4 % (ref 0.0–2.0)
EOS%: 1.7 % (ref 0.0–7.0)
Eosinophils Absolute: 0.1 10*3/uL (ref 0.0–0.5)
HEMATOCRIT: 46 % (ref 34.8–46.6)
HGB: 15.2 g/dL (ref 11.6–15.9)
LYMPH#: 1.9 10*3/uL (ref 0.9–3.3)
LYMPH%: 36.7 % (ref 14.0–48.0)
MCH: 28.8 pg (ref 26.0–34.0)
MCHC: 33 g/dL (ref 32.0–36.0)
MCV: 87 fL (ref 81–101)
MONO#: 0.4 10*3/uL (ref 0.1–0.9)
MONO%: 6.7 % (ref 0.0–13.0)
NEUT#: 2.9 10*3/uL (ref 1.5–6.5)
NEUT%: 54.5 % (ref 39.6–80.0)
Platelets: 272 10*3/uL (ref 145–400)
RBC: 5.27 10*6/uL (ref 3.70–5.32)
RDW: 13.9 % (ref 11.1–15.7)
WBC: 5.3 10*3/uL (ref 3.9–10.0)

## 2014-01-19 LAB — COMPREHENSIVE METABOLIC PANEL
ALBUMIN: 4.3 g/dL (ref 3.5–5.2)
ALT: 11 U/L (ref 0–35)
AST: 9 U/L (ref 0–37)
Alkaline Phosphatase: 90 U/L (ref 39–117)
BUN: 14 mg/dL (ref 6–23)
CALCIUM: 9.6 mg/dL (ref 8.4–10.5)
CHLORIDE: 104 meq/L (ref 96–112)
CO2: 23 meq/L (ref 19–32)
Creatinine, Ser: 0.59 mg/dL (ref 0.50–1.10)
GLUCOSE: 87 mg/dL (ref 70–99)
POTASSIUM: 3.9 meq/L (ref 3.5–5.3)
Sodium: 139 mEq/L (ref 135–145)
Total Bilirubin: 0.4 mg/dL (ref 0.3–1.2)
Total Protein: 6.8 g/dL (ref 6.0–8.3)

## 2014-01-19 LAB — IRON AND TIBC CHCC
%SAT: 25 % (ref 21–57)
Iron: 70 ug/dL (ref 41–142)
TIBC: 286 ug/dL (ref 236–444)
UIBC: 216 ug/dL (ref 120–384)

## 2014-01-19 LAB — TSH CHCC: TSH: 1.536 m(IU)/L (ref 0.308–3.960)

## 2014-01-19 LAB — FERRITIN CHCC: Ferritin: 144 ng/ml (ref 9–269)

## 2014-01-19 NOTE — Progress Notes (Signed)
This office note has been dictated.

## 2014-01-20 ENCOUNTER — Telehealth: Payer: Self-pay | Admitting: Nurse Practitioner

## 2014-01-20 NOTE — Telephone Encounter (Addendum)
Message copied by Jimmy Footman on Wed Jan 20, 2014  1:08 PM ------      Message from: Volanda Napoleon      Created: Tue Jan 19, 2014  2:23 PM       Call - thyroid is ok!!  [name] ------LVM on pt's personal machine and instructed her to contact our office with any further concerns.

## 2014-01-20 NOTE — Progress Notes (Signed)
CC:   Judithann Sauger, M.D.  DIAGNOSIS:  Recurrent iron-deficiency anemia.  CURRENT THERAPY:  IV iron as indicated - the patient last received a dose in November 2014.  INTERIM HISTORY:  [name] comes in for her followup.  She is feeling tired.  She feels dizzy.  I am not sure, she says, why this is happening.  She saw Dr. Tamala Julian, her family doctor, back in December.  She said everything was okay at that point in time.  She does have blood pressure issues.  She is not monitoring her blood pressure right now.  She got iron back in November.  At that point in time, her ferritin was 51 with iron saturation of 17%.  She says she does not absorb iron well.  She has had evaluation with upper and lower endoscopy.  She has had no abdominal pain.  There has been no change in bowel or bladder habits.  She has had some weight loss.  She has had no fever, sweats, or chills.  PHYSICAL EXAMINATION:  General:  This is a fairly well-developed, well- nourished white female in no obvious distress.  Vital Signs: Temperature of 98.2, pulse 74, respiratory rate 14, blood pressure 151/86.  Weight is 186 pounds.  Head and Neck:  Normocephalic, atraumatic skull.  There is no scleral icterus.  She has good extraocular muscle movements.  Lungs:  Clear bilaterally.  Cardiac: Regular rate and rhythm with a normal S1 and S2.  There are no murmurs, rubs, or bruits.  Abdomen:  Soft.  She has good bowel sounds.  There is no palpable abdominal mass.  There is no fluid wave.  There is no palpable hepatosplenomegaly.  BACK:  No tenderness over the spine, ribs, or hips.  Extremities:  No clubbing, cyanosis, or edema.  She has good range motion of her joints.  She has good muscle strength in upper and lower extremities.  Neurological:  No focal neurological deficits. Skin:  Some slightly dry skin.  LABORATORY STUDIES:  White cell count 5.3, hemoglobin 15.3, hematocrit 46, platelet count 272.  MCV is  87.  On her peripheral smear, she has a normochromic/normocytic population of red blood cells.  There is no deflated red blood cells.  I see no teardrop cells.  She has no rouleaux formation.  White cells appear normal in morphology and maturation.  There is no immature myeloid cells.  There are no atypical lymphocytes.  Platelets are adequate in number and size.  IMPRESSION:  Ms. Finken is a 53 year old white female.  She has recurrent iron-deficiency anemia.  She does not absorb iron well.  She has not had any monthly cycle for over 10 years.  Again, I cannot explain this weakness and fatigue.  We will check her TSH.  I am sure that she is at risk for hypothyroidism.  I will plan to see her back probably in another 3 months now.  I am not sure what kind of medicine we can even give her to try to help make her feel better.  Her blood pressure is a little on the higher side.  I am not sure if this really would make any impact.    ______________________________ Volanda Napoleon, M.D. PRE/MEDQ  D:  01/19/2014  T:  01/20/2014  Job:  [id number]

## 2014-01-20 NOTE — Telephone Encounter (Addendum)
Message copied by Jimmy Footman on Wed Jan 20, 2014  1:02 PM ------      Message from: Volanda Napoleon      Created: Tue Jan 19, 2014  2:13 PM       Call - iron is ok!!  [name] ------LVM on pt's personal machine and instructed her to contact our office with any further concerns.

## 2014-01-22 LAB — TRANSFERRIN RECEPTOR, SOLUABLE: Transferrin Receptor, Soluble: 2.12 mg/L — ABNORMAL HIGH (ref 0.76–1.76)

## 2014-02-16 ENCOUNTER — Ambulatory Visit: Payer: Self-pay

## 2014-03-23 ENCOUNTER — Ambulatory Visit: Payer: Self-pay

## 2014-03-25 ENCOUNTER — Ambulatory Visit
Admission: RE | Admit: 2014-03-25 | Discharge: 2014-03-25 | Disposition: A | Discharge status: Home / Self Care | Payer: BC Managed Care – PPO | Source: Ambulatory Visit | Attending: Hematology & Oncology | Admitting: Hematology & Oncology

## 2014-03-25 DIAGNOSIS — Z1231 Encounter for screening mammogram for malignant neoplasm of breast: Secondary | ICD-10-CM

## 2014-03-29 ENCOUNTER — Telehealth: Payer: Self-pay | Admitting: Hematology & Oncology

## 2014-03-29 NOTE — Telephone Encounter (Signed)
Pt called wanting lab, I scheduled 4-7 RN aware

## 2014-03-30 ENCOUNTER — Other Ambulatory Visit (HOSPITAL_BASED_OUTPATIENT_CLINIC_OR_DEPARTMENT_OTHER): Payer: BC Managed Care – PPO | Admitting: Lab

## 2014-03-30 DIAGNOSIS — R5383 Other fatigue: Secondary | ICD-10-CM

## 2014-03-30 DIAGNOSIS — D510 Vitamin B12 deficiency anemia due to intrinsic factor deficiency: Secondary | ICD-10-CM

## 2014-03-30 DIAGNOSIS — D509 Iron deficiency anemia, unspecified: Secondary | ICD-10-CM

## 2014-03-30 LAB — CBC WITH DIFFERENTIAL (CANCER CENTER ONLY)
BASO#: 0.1 10*3/uL (ref 0.0–0.2)
BASO%: 1.1 % (ref 0.0–2.0)
EOS%: 3.2 % (ref 0.0–7.0)
Eosinophils Absolute: 0.2 10*3/uL (ref 0.0–0.5)
HCT: 43.5 % (ref 34.8–46.6)
HEMOGLOBIN: 14.4 g/dL (ref 11.6–15.9)
LYMPH#: 1.7 10*3/uL (ref 0.9–3.3)
LYMPH%: 36.3 % (ref 14.0–48.0)
MCH: 28.5 pg (ref 26.0–34.0)
MCHC: 33.1 g/dL (ref 32.0–36.0)
MCV: 86 fL (ref 81–101)
MONO#: 0.3 10*3/uL (ref 0.1–0.9)
MONO%: 7.2 % (ref 0.0–13.0)
NEUT%: 52.2 % (ref 39.6–80.0)
NEUTROS ABS: 2.5 10*3/uL (ref 1.5–6.5)
Platelets: 295 10*3/uL (ref 145–400)
RBC: 5.05 10*6/uL (ref 3.70–5.32)
RDW: 13.7 % (ref 11.1–15.7)
WBC: 4.7 10*3/uL (ref 3.9–10.0)

## 2014-03-30 LAB — IRON AND TIBC CHCC
%SAT: 19 % — ABNORMAL LOW (ref 21–57)
Iron: 61 ug/dL (ref 41–142)
TIBC: 324 ug/dL (ref 236–444)
UIBC: 262 ug/dL (ref 120–384)

## 2014-03-30 LAB — VITAMIN B12: Vitamin B-12: 332 pg/mL (ref 211–911)

## 2014-03-30 LAB — CHCC SATELLITE - SMEAR

## 2014-03-30 LAB — RETICULOCYTES (CHCC)
ABS Retic: 119.8 10*3/uL (ref 19.0–186.0)
RBC.: 4.99 MIL/uL (ref 3.87–5.11)
RETIC CT PCT: 2.4 % — AB (ref 0.4–2.3)

## 2014-03-30 LAB — FERRITIN CHCC: FERRITIN: 46 ng/mL (ref 9–269)

## 2014-03-31 ENCOUNTER — Encounter: Payer: Self-pay | Admitting: *Deleted

## 2014-03-31 ENCOUNTER — Other Ambulatory Visit: Payer: Self-pay | Admitting: *Deleted

## 2014-03-31 ENCOUNTER — Telehealth: Payer: Self-pay | Admitting: Hematology & Oncology

## 2014-03-31 DIAGNOSIS — D509 Iron deficiency anemia, unspecified: Secondary | ICD-10-CM

## 2014-03-31 NOTE — Telephone Encounter (Signed)
Left pt message to call and schedule iron infusion

## 2014-03-31 NOTE — Telephone Encounter (Signed)
Pt aware of 4-9 iron. Per Baxter Flattery NPR

## 2014-04-01 ENCOUNTER — Ambulatory Visit (HOSPITAL_BASED_OUTPATIENT_CLINIC_OR_DEPARTMENT_OTHER): Payer: BC Managed Care – PPO

## 2014-04-01 VITALS — BP 160/102 | HR 84 | Temp 98.4°F | Resp 20

## 2014-04-01 DIAGNOSIS — D509 Iron deficiency anemia, unspecified: Secondary | ICD-10-CM

## 2014-04-01 MED ORDER — SODIUM CHLORIDE 0.9 % IV SOLN
1020.0000 mg | Freq: Once | INTRAVENOUS | Status: AC
  Administered 2014-04-01: 1020 mg via INTRAVENOUS
  Filled 2014-04-01: qty 34

## 2014-04-01 MED ORDER — FAMOTIDINE IN NACL 20-0.9 MG/50ML-% IV SOLN
20.0000 mg | Freq: Two times a day (BID) | INTRAVENOUS | Status: DC
  Administered 2014-04-01: 20 mg via INTRAVENOUS

## 2014-04-01 MED ORDER — SODIUM CHLORIDE 0.9 % IV SOLN
Freq: Once | INTRAVENOUS | Status: AC
  Administered 2014-04-01: 13:00:00 via INTRAVENOUS

## 2014-04-01 MED ORDER — FAMOTIDINE IN NACL 20-0.9 MG/50ML-% IV SOLN
INTRAVENOUS | Status: AC
  Filled 2014-04-01: qty 50

## 2014-04-01 MED ORDER — METHYLPREDNISOLONE SODIUM SUCC 125 MG IJ SOLR
125.0000 mg | Freq: Once | INTRAMUSCULAR | Status: AC
  Administered 2014-04-01: 125 mg via INTRAVENOUS

## 2014-04-01 MED ORDER — METHYLPREDNISOLONE SODIUM SUCC 125 MG IJ SOLR
INTRAMUSCULAR | Status: AC
  Filled 2014-04-01: qty 2

## 2014-04-01 NOTE — Patient Instructions (Signed)

## 2014-04-21 ENCOUNTER — Other Ambulatory Visit: Payer: Self-pay | Admitting: Nurse Practitioner

## 2014-04-21 DIAGNOSIS — D509 Iron deficiency anemia, unspecified: Secondary | ICD-10-CM

## 2014-04-22 ENCOUNTER — Other Ambulatory Visit (HOSPITAL_BASED_OUTPATIENT_CLINIC_OR_DEPARTMENT_OTHER): Payer: BC Managed Care – PPO | Admitting: Lab

## 2014-04-22 ENCOUNTER — Ambulatory Visit (HOSPITAL_BASED_OUTPATIENT_CLINIC_OR_DEPARTMENT_OTHER): Payer: BC Managed Care – PPO | Admitting: Hematology & Oncology

## 2014-04-22 ENCOUNTER — Encounter: Payer: Self-pay | Admitting: Hematology & Oncology

## 2014-04-22 VITALS — BP 151/97 | HR 77 | Temp 98.1°F | Resp 14 | Ht 64.0 in | Wt 190.0 lb

## 2014-04-22 DIAGNOSIS — D509 Iron deficiency anemia, unspecified: Secondary | ICD-10-CM

## 2014-04-22 LAB — CBC WITH DIFFERENTIAL (CANCER CENTER ONLY)
BASO#: 0 10*3/uL (ref 0.0–0.2)
BASO%: 0.5 % (ref 0.0–2.0)
EOS ABS: 0.1 10*3/uL (ref 0.0–0.5)
EOS%: 2 % (ref 0.0–7.0)
HCT: 42.9 % (ref 34.8–46.6)
HGB: 14.5 g/dL (ref 11.6–15.9)
LYMPH#: 2 10*3/uL (ref 0.9–3.3)
LYMPH%: 36.1 % (ref 14.0–48.0)
MCH: 29.1 pg (ref 26.0–34.0)
MCHC: 33.8 g/dL (ref 32.0–36.0)
MCV: 86 fL (ref 81–101)
MONO#: 0.4 10*3/uL (ref 0.1–0.9)
MONO%: 7.7 % (ref 0.0–13.0)
NEUT%: 53.7 % (ref 39.6–80.0)
NEUTROS ABS: 2.9 10*3/uL (ref 1.5–6.5)
PLATELETS: 279 10*3/uL (ref 145–400)
RBC: 4.99 10*6/uL (ref 3.70–5.32)
RDW: 14.3 % (ref 11.1–15.7)
WBC: 5.5 10*3/uL (ref 3.9–10.0)

## 2014-04-22 LAB — RETICULOCYTES (CHCC)
ABS Retic: 145.9 10*3/uL (ref 19.0–186.0)
RBC.: 5.03 MIL/uL (ref 3.87–5.11)
Retic Ct Pct: 2.9 % — ABNORMAL HIGH (ref 0.4–2.3)

## 2014-04-22 LAB — CHCC SATELLITE - SMEAR

## 2014-04-22 NOTE — Progress Notes (Signed)
Hematology and Oncology Follow Up Visit  Jamie Mcdowell [id number] 1961/07/21 53 y.o. 04/22/2014   Principle Diagnosis:   Recurrent iron deficiency anemia  Current Therapy:    IV iron as indicated-last dose given April 2015     Interim History:  Jamie Mcdowell is back for followup. She feels okay. She still has little bit of dizziness. This may be from blood pressure. It may also be seasonal allergies.  We did give her iron a couple weeks ago. At that point in time, her ferritin was 46 with saturation of 90%. Her B12 was 332. She's had no bleeding. She's not had much a monthly cycle now for about 15 years.  She's had no fatigue. She's had no skin issues. She's had no rashes. Said no cough or shortness of breath.  We did check her thyroid levels. Her TSH was 1.5.  Medications: Current outpatient prescriptions:acetaminophen (TYLENOL) 325 MG tablet, Take 325 mg by mouth as needed. , Disp: , Rfl: ;  diphenhydrAMINE (BENADRYL) 25 MG tablet, Take 25 mg by mouth at bedtime as needed., Disp: , Rfl: ;  omeprazole (PRILOSEC) 10 MG capsule, Take 20 mg by mouth daily. , Disp: , Rfl: ;  Fexofenadine-Pseudoephedrine (ALLEGRA-D 24 HOUR PO), Take by mouth daily.  , Disp: , Rfl:   Allergies:  Allergies  Allergen Reactions  . Ciprocinonide [Fluocinolone]   . Penicillins   . Sulfa Antibiotics     Past Medical History, Surgical history, Social history, and Family History were reviewed and updated.  Review of Systems: As above  Physical Exam:  height is 5\' 4"  (1.626 m) and weight is 190 lb (86.183 kg). Her oral temperature is 98.1 F (36.7 C). Her blood pressure is 151/97 and her pulse is 77. Her respiration is 14.   Well-developed and well-nourished white female. Lungs are clear. Cardiac exam regular rate and rhythm with no murmurs rubs or bruits. Head and neck exam shows no scleral icterus. There is no oral lesion. Neck is supple with no adenopathy. Abdomen is soft. She has good bowel sounds. There is  no fluid wave. There is no palpable liver or spleen tip. Exam no tenderness over the spine ribs or hips. Extremities shows no clubbing cyanosis or edema. Skin exam no rashes.  Lab Results  Component Value Date   WBC 5.5 04/22/2014   HGB 14.5 04/22/2014   HCT 42.9 04/22/2014   MCV 86 04/22/2014   PLT 279 04/22/2014     Chemistry      Component Value Date/Time   NA 139 01/19/2014 1058   NA 141 09/02/2012 0859   K 3.9 01/19/2014 1058   K 3.8 09/02/2012 0859   CL 104 01/19/2014 1058   CL 105 09/02/2012 0859   CO2 23 01/19/2014 1058   CO2 26 09/02/2012 0859   BUN 14 01/19/2014 1058   BUN 13 09/02/2012 0859   CREATININE 0.59 01/19/2014 1058   CREATININE 0.6 09/02/2012 0859      Component Value Date/Time   CALCIUM 9.6 01/19/2014 1058   CALCIUM 9.2 09/02/2012 0859   ALKPHOS 90 01/19/2014 1058   ALKPHOS 100* 09/02/2012 0859   AST 9 01/19/2014 1058   AST 18 09/02/2012 0859   ALT 11 01/19/2014 1058   ALT 17 09/02/2012 0859   BILITOT 0.4 01/19/2014 1058   BILITOT 0.70 09/02/2012 0859         Impression and Plan: Jamie Mcdowell is a 89-year-old white female with recurrent iron deficiency anemia. She just does  not absorb iron well. She may have some GI blood loss. However, if this isn't the case, it is low-grade and chronic.  We will see what her iron levels show.  Other wise we can probably get her back in 3 months time now.  Vital pseudoanemia blood work in between visits unless she does have a problem.  Volanda Napoleon, MD 4/30/20153:22 PM

## 2014-04-23 LAB — IRON AND TIBC CHCC
%SAT: 23 % (ref 21–57)
IRON: 58 ug/dL (ref 41–142)
TIBC: 255 ug/dL (ref 236–444)
UIBC: 197 ug/dL (ref 120–384)

## 2014-04-23 LAB — FERRITIN CHCC: FERRITIN: 435 ng/mL — AB (ref 9–269)

## 2014-04-26 ENCOUNTER — Telehealth: Payer: Self-pay | Admitting: *Deleted

## 2014-04-26 NOTE — Telephone Encounter (Addendum)
Message copied by Lenn Sink on Mon Apr 26, 2014  9:06 AM ------      Message from: Volanda Napoleon      Created: Sun Apr 25, 2014  8:23 PM       Call - iron is fantastic!!  pete ------Left voicemail informing pt that iron is fantastic.

## 2014-07-22 ENCOUNTER — Encounter: Payer: Self-pay | Admitting: Hematology & Oncology

## 2014-07-22 ENCOUNTER — Other Ambulatory Visit (HOSPITAL_BASED_OUTPATIENT_CLINIC_OR_DEPARTMENT_OTHER): Payer: BC Managed Care – PPO | Admitting: Lab

## 2014-07-22 ENCOUNTER — Ambulatory Visit (HOSPITAL_BASED_OUTPATIENT_CLINIC_OR_DEPARTMENT_OTHER): Payer: BC Managed Care – PPO | Admitting: Hematology & Oncology

## 2014-07-22 VITALS — BP 164/86 | HR 86 | Temp 97.8°F | Resp 16 | Ht 65.0 in | Wt 185.0 lb

## 2014-07-22 DIAGNOSIS — D509 Iron deficiency anemia, unspecified: Secondary | ICD-10-CM

## 2014-07-22 LAB — CBC WITH DIFFERENTIAL (CANCER CENTER ONLY)
BASO#: 0 10*3/uL (ref 0.0–0.2)
BASO%: 0.3 % (ref 0.0–2.0)
EOS%: 2 % (ref 0.0–7.0)
Eosinophils Absolute: 0.1 10*3/uL (ref 0.0–0.5)
HEMATOCRIT: 42.9 % (ref 34.8–46.6)
HEMOGLOBIN: 14.6 g/dL (ref 11.6–15.9)
LYMPH#: 2 10*3/uL (ref 0.9–3.3)
LYMPH%: 34 % (ref 14.0–48.0)
MCH: 29.6 pg (ref 26.0–34.0)
MCHC: 34 g/dL (ref 32.0–36.0)
MCV: 87 fL (ref 81–101)
MONO#: 0.5 10*3/uL (ref 0.1–0.9)
MONO%: 7.8 % (ref 0.0–13.0)
NEUT#: 3.3 10*3/uL (ref 1.5–6.5)
NEUT%: 55.9 % (ref 39.6–80.0)
Platelets: 261 10*3/uL (ref 145–400)
RBC: 4.93 10*6/uL (ref 3.70–5.32)
RDW: 13.9 % (ref 11.1–15.7)
WBC: 5.9 10*3/uL (ref 3.9–10.0)

## 2014-07-22 NOTE — Progress Notes (Signed)
Hematology and Oncology Follow Up Visit  Jamie Mcdowell [id number] 1961/06/18 53 y.o. 07/22/2014   Principle Diagnosis:   Recurrent iron deficiency anemia  Current Therapy:    IV iron as indicated-last dose in April 2015     Interim History:  [name] is in for followup. She's doing well. She's had a good summer. She's had no problems with fatigue or weakness. There's been no bleeding. She's had no nausea vomiting. She's had no cough or shortness of breath. She's had no rashes. There's been no leg swelling. She does have some sinus issues.  We last saw her in April, her ferritin was 435 with an iron saturation of 23%. She had iron about 3 weeks prior to that.  She's had no menstrual cycles. She's not had any for 15 years.  Medications: Current outpatient prescriptions:acetaminophen (TYLENOL) 325 MG tablet, Take 325 mg by mouth as needed. , Disp: , Rfl: ;  diphenhydrAMINE (BENADRYL) 25 MG tablet, Take 25 mg by mouth at bedtime as needed., Disp: , Rfl: ;  Fexofenadine-Pseudoephedrine (ALLEGRA-D 24 HOUR PO), Take by mouth daily.  , Disp: , Rfl: ;  omeprazole (PRILOSEC) 10 MG capsule, Take 20 mg by mouth daily. , Disp: , Rfl:   Allergies:  Allergies  Allergen Reactions  . Ciprocinonide [Fluocinolone]   . Penicillins   . Sulfa Antibiotics     Past Medical History, Surgical history, Social history, and Family History were reviewed and updated.  Review of Systems: As above  Physical Exam:  height is 5\' 5"  (1.651 m) and weight is 185 lb (83.915 kg). Her oral temperature is 97.8 F (36.6 C). Her blood pressure is 164/86 and her pulse is 86. Her respiration is 16.   Lungs are clear. Cardiac exam regular in rhythm. Abdomen is soft. She is good bowel sounds. There is no fluid wave. There is no palpable liver or spleen tip. Exam no tenderness over the spine ribs or hips. Extremities shows no clubbing cyanosis or edema. Skin exam no rashes.  Lab Results  Component Value Date   WBC 5.9  07/22/2014   HGB 14.6 07/22/2014   HCT 42.9 07/22/2014   MCV 87 07/22/2014   PLT 261 07/22/2014     Chemistry      Component Value Date/Time   NA 139 01/19/2014 1058   NA 141 09/02/2012 0859   K 3.9 01/19/2014 1058   K 3.8 09/02/2012 0859   CL 104 01/19/2014 1058   CL 105 09/02/2012 0859   CO2 23 01/19/2014 1058   CO2 26 09/02/2012 0859   BUN 14 01/19/2014 1058   BUN 13 09/02/2012 0859   CREATININE 0.59 01/19/2014 1058   CREATININE 0.6 09/02/2012 0859      Component Value Date/Time   CALCIUM 9.6 01/19/2014 1058   CALCIUM 9.2 09/02/2012 0859   ALKPHOS 90 01/19/2014 1058   ALKPHOS 100* 09/02/2012 0859   AST 9 01/19/2014 1058   AST 18 09/02/2012 0859   ALT 11 01/19/2014 1058   ALT 17 09/02/2012 0859   BILITOT 0.4 01/19/2014 1058   BILITOT 0.70 09/02/2012 0859         Impression and Plan: Jamie Mcdowell is a 53 year old white female with recurrent iron deficiency anemia. Her blood counts actually are doing pretty well. Her hemoglobin is holding up pretty study.  We will probably go back in about 3 months. She always will let us know if she needs any blood work in between visits.  Again, I would  think that her iron studies are okay.   Volanda Napoleon, MD 7/30/20156:47 PM

## 2014-07-23 LAB — FERRITIN CHCC: Ferritin: 251 ng/ml (ref 9–269)

## 2014-07-23 LAB — IRON AND TIBC CHCC
%SAT: 22 % (ref 21–57)
Iron: 54 ug/dL (ref 41–142)
TIBC: 250 ug/dL (ref 236–444)
UIBC: 196 ug/dL (ref 120–384)

## 2014-07-27 ENCOUNTER — Encounter: Payer: Self-pay | Admitting: *Deleted

## 2014-08-31 ENCOUNTER — Telehealth: Payer: Self-pay | Admitting: Hematology & Oncology

## 2014-08-31 ENCOUNTER — Other Ambulatory Visit: Payer: Self-pay | Admitting: *Deleted

## 2014-08-31 ENCOUNTER — Other Ambulatory Visit (HOSPITAL_BASED_OUTPATIENT_CLINIC_OR_DEPARTMENT_OTHER): Payer: BC Managed Care – PPO | Admitting: Lab

## 2014-08-31 DIAGNOSIS — D509 Iron deficiency anemia, unspecified: Secondary | ICD-10-CM

## 2014-08-31 LAB — CBC WITH DIFFERENTIAL (CANCER CENTER ONLY)
BASO#: 0 10*3/uL (ref 0.0–0.2)
BASO%: 0.5 % (ref 0.0–2.0)
EOS ABS: 0.1 10*3/uL (ref 0.0–0.5)
EOS%: 2 % (ref 0.0–7.0)
HCT: 44 % (ref 34.8–46.6)
HEMOGLOBIN: 14.7 g/dL (ref 11.6–15.9)
LYMPH#: 2.1 10*3/uL (ref 0.9–3.3)
LYMPH%: 35.8 % (ref 14.0–48.0)
MCH: 29.3 pg (ref 26.0–34.0)
MCHC: 33.4 g/dL (ref 32.0–36.0)
MCV: 88 fL (ref 81–101)
MONO#: 0.4 10*3/uL (ref 0.1–0.9)
MONO%: 6.1 % (ref 0.0–13.0)
NEUT%: 55.6 % (ref 39.6–80.0)
NEUTROS ABS: 3.3 10*3/uL (ref 1.5–6.5)
Platelets: 262 10*3/uL (ref 145–400)
RBC: 5.01 10*6/uL (ref 3.70–5.32)
RDW: 13.6 % (ref 11.1–15.7)
WBC: 6 10*3/uL (ref 3.9–10.0)

## 2014-08-31 NOTE — Telephone Encounter (Signed)
Patient called spoke with Vivia Birmingham.  Per Rn to sch patient for lab apt for today.  Patient sch apt for 2:15 today

## 2014-09-01 LAB — IRON AND TIBC CHCC
%SAT: 16 % — AB (ref 21–57)
IRON: 44 ug/dL (ref 41–142)
TIBC: 273 ug/dL (ref 236–444)
UIBC: 228 ug/dL (ref 120–384)

## 2014-09-01 LAB — FERRITIN CHCC: FERRITIN: 193 ng/mL (ref 9–269)

## 2014-09-07 ENCOUNTER — Encounter: Payer: Self-pay | Admitting: *Deleted

## 2014-09-07 ENCOUNTER — Other Ambulatory Visit: Payer: Self-pay | Admitting: *Deleted

## 2014-09-07 ENCOUNTER — Telehealth: Payer: Self-pay | Admitting: Hematology & Oncology

## 2014-09-07 DIAGNOSIS — D509 Iron deficiency anemia, unspecified: Secondary | ICD-10-CM

## 2014-09-07 NOTE — Telephone Encounter (Signed)
Left pt message to call for appointment °

## 2014-09-09 ENCOUNTER — Ambulatory Visit (HOSPITAL_BASED_OUTPATIENT_CLINIC_OR_DEPARTMENT_OTHER): Payer: BC Managed Care – PPO

## 2014-09-09 VITALS — BP 170/80 | HR 69 | Temp 98.1°F | Resp 18

## 2014-09-09 DIAGNOSIS — D509 Iron deficiency anemia, unspecified: Secondary | ICD-10-CM

## 2014-09-09 MED ORDER — SODIUM CHLORIDE 0.9 % IV SOLN
1020.0000 mg | Freq: Once | INTRAVENOUS | Status: AC
  Administered 2014-09-09: 1020 mg via INTRAVENOUS
  Filled 2014-09-09: qty 34

## 2014-09-09 MED ORDER — METHYLPREDNISOLONE SODIUM SUCC 125 MG IJ SOLR
INTRAMUSCULAR | Status: AC
  Filled 2014-09-09: qty 2

## 2014-09-09 MED ORDER — SODIUM CHLORIDE 0.9 % IV SOLN
Freq: Once | INTRAVENOUS | Status: AC
  Administered 2014-09-09: 14:00:00 via INTRAVENOUS

## 2014-09-09 MED ORDER — INFLUENZA VAC SPLIT QUAD 0.5 ML IM SUSY
0.5000 mL | PREFILLED_SYRINGE | Freq: Once | INTRAMUSCULAR | Status: DC
  Filled 2014-09-09: qty 0.5

## 2014-09-09 MED ORDER — METHYLPREDNISOLONE SODIUM SUCC 125 MG IJ SOLR
125.0000 mg | Freq: Once | INTRAMUSCULAR | Status: AC
  Administered 2014-09-09: 125 mg via INTRAVENOUS

## 2014-09-09 NOTE — Progress Notes (Signed)
210pm  Complain of "lightheadedness" , Iron infusion stopped, saline bolus started, VSS.  2:22 PMFeeling better.  No complaints, Dr. Marin Olp notified, orders received.  Iron restarted at 1500, tolerated well.

## 2014-09-09 NOTE — Patient Instructions (Signed)

## 2014-09-15 ENCOUNTER — Other Ambulatory Visit: Payer: Self-pay

## 2014-09-15 NOTE — Telephone Encounter (Signed)
Received call from pt reporting that her arms starting itching on Saturday after receiving Feraheme on Thursday 9/17. Questions if it could be related to infusion. Denies visible rash or other sx. Has tried Benadryl 50mg  po at night & reports "it works for about 4 hours." denies any other dietary or cosmetic/hygiene product changes. Spoke with [facility] pharmacist who agrees it would be unusual for Feraheme to have a delayed reaction limited only to bilat arms.  Pt notified of this. Questions what else she could try for itching. Unable to take Benadryl around the clock d/t work. Advised pt to try other OTC anti-histamines. Verbalizes understanding and appreciation. dph

## 2014-11-04 ENCOUNTER — Ambulatory Visit (HOSPITAL_BASED_OUTPATIENT_CLINIC_OR_DEPARTMENT_OTHER): Payer: BC Managed Care – PPO | Admitting: Family

## 2014-11-04 ENCOUNTER — Other Ambulatory Visit (HOSPITAL_BASED_OUTPATIENT_CLINIC_OR_DEPARTMENT_OTHER): Payer: BC Managed Care – PPO | Admitting: Lab

## 2014-11-04 DIAGNOSIS — D509 Iron deficiency anemia, unspecified: Secondary | ICD-10-CM

## 2014-11-04 LAB — CBC WITH DIFFERENTIAL (CANCER CENTER ONLY)
BASO#: 0 10*3/uL (ref 0.0–0.2)
BASO%: 0.4 % (ref 0.0–2.0)
EOS%: 1.4 % (ref 0.0–7.0)
Eosinophils Absolute: 0.1 10*3/uL (ref 0.0–0.5)
HCT: 42.5 % (ref 34.8–46.6)
HGB: 14.3 g/dL (ref 11.6–15.9)
LYMPH#: 1.7 10*3/uL (ref 0.9–3.3)
LYMPH%: 30.5 % (ref 14.0–48.0)
MCH: 30 pg (ref 26.0–34.0)
MCHC: 33.6 g/dL (ref 32.0–36.0)
MCV: 89 fL (ref 81–101)
MONO#: 0.4 10*3/uL (ref 0.1–0.9)
MONO%: 7.3 % (ref 0.0–13.0)
NEUT#: 3.4 10*3/uL (ref 1.5–6.5)
NEUT%: 60.4 % (ref 39.6–80.0)
Platelets: 267 10*3/uL (ref 145–400)
RBC: 4.77 10*6/uL (ref 3.70–5.32)
RDW: 13.7 % (ref 11.1–15.7)
WBC: 5.6 10*3/uL (ref 3.9–10.0)

## 2014-11-04 LAB — RETICULOCYTES (CHCC)
ABS Retic: 159.7 10*3/uL (ref 19.0–186.0)
RBC.: 4.84 MIL/uL (ref 3.87–5.11)
RETIC CT PCT: 3.3 % — AB (ref 0.4–2.3)

## 2014-11-04 LAB — CHCC SATELLITE - SMEAR

## 2014-11-04 NOTE — Progress Notes (Signed)
[place]  [phone] [phone]) [phone]  ID: Jamie Mcdowell OB: 03-13-1961 MR#: [id number]#:[id number] Patient Care Team: Reginia Naas, MD as PCP - General (Family Medicine)  DIAGNOSIS: Recurrent iron deficiency anemia  INTERVAL HISTORY: Jamie Mcdowell is here today for a follow-up. She's doing well. She last had iron in September. She has had itching "under the skin" of her arms. She has been taking benedryl but it isn't helping.  She denies fever, chills, n/v, cough, rash, headache, dizziness, SOB, chest pain, palpitations, abdominal pain, constipation, diarrhea, blood in urine or stool. No bleeding or pain. No swelling, tenderness, numbness or tingling in her extremities.  Her appetite is good and she is staying hydrated.   CURRENT TREATMENT: IV iron as indicated-last dose in April 2015  REVIEW OF SYSTEMS: All other 10 point review of systems is negative.   PAST MEDICAL HISTORY: No past medical history on file.  PAST SURGICAL HISTORY: No past surgical history on file.  FAMILY HISTORY No family history on file.  GYNECOLOGIC HISTORY:  No LMP recorded. Patient is not currently having periods (Reason: Other).   SOCIAL HISTORY: History   Social History  . Marital Status: Married    Spouse Name: N/A    Number of Children: N/A  . Years of Education: N/A   Occupational History  . Not on file.   Social History Main Topics  . Smoking status: Former Smoker -- 0.50 packs/day for 8 years    Types: Cigarettes    Start date: 01/19/1975    Quit date: 01/19/1983  . Smokeless tobacco: Never Used     Comment: quit 31 years ago  . Alcohol Use: Not on file  . Drug Use: Not on file  . Sexual Activity: Not on file   Other Topics Concern  . Not on file   Social History Narrative  . No narrative on file    ADVANCED DIRECTIVES:  <no information>  HEALTH MAINTENANCE: History  Substance Use Topics  . Smoking status: Former Smoker -- 0.50  packs/day for 8 years    Types: Cigarettes    Start date: 01/19/1975    Quit date: 01/19/1983  . Smokeless tobacco: Never Used     Comment: quit 31 years ago  . Alcohol Use: Not on file   Colonoscopy: PAP: Bone density: Lipid panel:  Allergies  Allergen Reactions  . Ciprocinonide [Fluocinolone]   . Penicillins   . Sulfa Antibiotics     Current Outpatient Prescriptions  Medication Sig Dispense Refill  . acetaminophen (TYLENOL) 325 MG tablet Take 325 mg by mouth as needed.     . diphenhydrAMINE (BENADRYL) 25 MG tablet Take 25 mg by mouth at bedtime as needed.    Marland Kitchen Fexofenadine-Pseudoephedrine (ALLEGRA-D 24 HOUR PO) Take by mouth daily.      Marland Kitchen omeprazole (PRILOSEC) 10 MG capsule Take 20 mg by mouth daily.      No current facility-administered medications for this visit.    OBJECTIVE: Filed Vitals:   11/04/14 1453  BP: 155/99  Pulse: 79  Temp: 98.6 F (37 C)  Resp: 16    Filed Weights   11/04/14 1453  Weight: 186 lb (84.369 kg)   ECOG FS:0 - Asymptomatic Ocular: Sclerae unicteric, pupils equal, round and reactive to light Ear-nose-throat: Oropharynx clear, dentition fair Lymphatic: No cervical or supraclavicular adenopathy Lungs no rales or rhonchi, good excursion bilaterally Heart regular rate and rhythm, no murmur appreciated Abd soft, nontender, positive bowel sounds MSK no focal spinal tenderness,  no joint edema Neuro: non-focal, well-oriented, appropriate affect Breasts: Deferred  LAB RESULTS: CMP     Component Value Date/Time   NA 139 01/19/2014 1058   NA 141 09/02/2012 0859   K 3.9 01/19/2014 1058   K 3.8 09/02/2012 0859   CL 104 01/19/2014 1058   CL 105 09/02/2012 0859   CO2 23 01/19/2014 1058   CO2 26 09/02/2012 0859   GLUCOSE 87 01/19/2014 1058   GLUCOSE 99 09/02/2012 0859   BUN 14 01/19/2014 1058   BUN 13 09/02/2012 0859   CREATININE 0.59 01/19/2014 1058   CREATININE 0.6 09/02/2012 0859   CALCIUM 9.6 01/19/2014 1058   CALCIUM 9.2  09/02/2012 0859   PROT 6.8 01/19/2014 1058   PROT 7.4 09/02/2012 0859   ALBUMIN 4.3 01/19/2014 1058   AST 9 01/19/2014 1058   AST 18 09/02/2012 0859   ALT 11 01/19/2014 1058   ALT 17 09/02/2012 0859   ALKPHOS 90 01/19/2014 1058   ALKPHOS 100* 09/02/2012 0859   BILITOT 0.4 01/19/2014 1058   BILITOT 0.70 09/02/2012 0859   GFRNONAA >60 04/11/2011 1425   GFRAA  04/11/2011 1425    >60        The eGFR has been calculated using the MDRD equation. This calculation has not been validated in all clinical situations. eGFR's persistently <60 mL/min signify possible Chronic Kidney Disease.   INo results found for: SPEP, UPEP Lab Results  Component Value Date   WBC 5.6 11/04/2014   NEUTROABS 3.4 11/04/2014   HGB 14.3 11/04/2014   HCT 42.5 11/04/2014   MCV 89 11/04/2014   PLT 267 11/04/2014   No results found for: LABCA2 No components found for: [id number] No results for input(s): INR in the last 168 hours.  STUDIES: No results found.  ASSESSMENT/PLAN: Jamie Mcdowell is a 53 year old white female with recurrent iron deficiency anemia.Her Hgb continue to hold steady and she is not symptomatic of anemia.  She will start taking Pepcid 40 mg BID and see if this helps with the itching. We will see what her iron studies show.  We will see her back in 3 months for labs and follow-up.  She knows to call here with any questions or concerns and to go to the ED in the event of an emergency. We can certainly see her sooner if need be.  [name], NP 11/04/2014 4:01 PM

## 2014-11-05 LAB — IRON AND TIBC CHCC
%SAT: 23 % (ref 21–57)
Iron: 58 ug/dL (ref 41–142)
TIBC: 250 ug/dL (ref 236–444)
UIBC: 192 ug/dL (ref 120–384)

## 2014-11-05 LAB — FERRITIN CHCC: Ferritin: 443 ng/ml — ABNORMAL HIGH (ref 9–269)

## 2014-11-08 ENCOUNTER — Telehealth: Payer: Self-pay | Admitting: *Deleted

## 2014-11-08 NOTE — Telephone Encounter (Addendum)
-----   Message from Jamie Napoleon, MD sent at 11/05/2014  5:53 PM EST ----- Please call and let her know that the iron is okay. [name] -Informed pt that Dr Marin Olp reviewed her labs and says that her iron is okay.

## 2015-01-03 ENCOUNTER — Other Ambulatory Visit: Payer: Self-pay | Admitting: *Deleted

## 2015-01-03 ENCOUNTER — Other Ambulatory Visit (HOSPITAL_BASED_OUTPATIENT_CLINIC_OR_DEPARTMENT_OTHER): Payer: BC Managed Care – PPO | Admitting: Lab

## 2015-01-03 DIAGNOSIS — D509 Iron deficiency anemia, unspecified: Secondary | ICD-10-CM

## 2015-01-03 LAB — CBC WITH DIFFERENTIAL (CANCER CENTER ONLY)
BASO#: 0 10*3/uL (ref 0.0–0.2)
BASO%: 0.3 % (ref 0.0–2.0)
EOS%: 1.8 % (ref 0.0–7.0)
Eosinophils Absolute: 0.1 10*3/uL (ref 0.0–0.5)
HCT: 44.9 % (ref 34.8–46.6)
HGB: 15 g/dL (ref 11.6–15.9)
LYMPH#: 1.5 10*3/uL (ref 0.9–3.3)
LYMPH%: 37.9 % (ref 14.0–48.0)
MCH: 29.6 pg (ref 26.0–34.0)
MCHC: 33.4 g/dL (ref 32.0–36.0)
MCV: 89 fL (ref 81–101)
MONO#: 0.3 10*3/uL (ref 0.1–0.9)
MONO%: 6.8 % (ref 0.0–13.0)
NEUT#: 2.1 10*3/uL (ref 1.5–6.5)
NEUT%: 53.2 % (ref 39.6–80.0)
PLATELETS: 254 10*3/uL (ref 145–400)
RBC: 5.06 10*6/uL (ref 3.70–5.32)
RDW: 13.2 % (ref 11.1–15.7)
WBC: 4 10*3/uL (ref 3.9–10.0)

## 2015-01-04 LAB — IRON AND TIBC CHCC
%SAT: 31 % (ref 21–57)
Iron: 80 ug/dL (ref 41–142)
TIBC: 259 ug/dL (ref 236–444)
UIBC: 179 ug/dL (ref 120–384)

## 2015-01-04 LAB — FERRITIN CHCC: Ferritin: 344 ng/ml — ABNORMAL HIGH (ref 9–269)

## 2015-01-05 ENCOUNTER — Telehealth: Payer: Self-pay | Admitting: *Deleted

## 2015-01-05 NOTE — Telephone Encounter (Signed)
-----   Message from Volanda Napoleon, MD sent at 01/04/2015  7:22 PM EST ----- Call - iron is ok!!  pete

## 2015-02-03 ENCOUNTER — Other Ambulatory Visit (HOSPITAL_BASED_OUTPATIENT_CLINIC_OR_DEPARTMENT_OTHER): Payer: BC Managed Care – PPO | Admitting: Lab

## 2015-02-03 ENCOUNTER — Ambulatory Visit (HOSPITAL_BASED_OUTPATIENT_CLINIC_OR_DEPARTMENT_OTHER): Payer: BC Managed Care – PPO | Admitting: Hematology & Oncology

## 2015-02-03 ENCOUNTER — Encounter: Payer: Self-pay | Admitting: Hematology & Oncology

## 2015-02-03 DIAGNOSIS — D509 Iron deficiency anemia, unspecified: Secondary | ICD-10-CM

## 2015-02-03 DIAGNOSIS — I10 Essential (primary) hypertension: Secondary | ICD-10-CM

## 2015-02-03 LAB — CBC WITH DIFFERENTIAL (CANCER CENTER ONLY)
BASO#: 0 10*3/uL (ref 0.0–0.2)
BASO%: 0.3 % (ref 0.0–2.0)
EOS ABS: 0.1 10*3/uL (ref 0.0–0.5)
EOS%: 1.4 % (ref 0.0–7.0)
HEMATOCRIT: 44.8 % (ref 34.8–46.6)
HGB: 14.8 g/dL (ref 11.6–15.9)
LYMPH#: 1.9 10*3/uL (ref 0.9–3.3)
LYMPH%: 29.7 % (ref 14.0–48.0)
MCH: 29.1 pg (ref 26.0–34.0)
MCHC: 33 g/dL (ref 32.0–36.0)
MCV: 88 fL (ref 81–101)
MONO#: 0.4 10*3/uL (ref 0.1–0.9)
MONO%: 6.2 % (ref 0.0–13.0)
NEUT#: 4 10*3/uL (ref 1.5–6.5)
NEUT%: 62.4 % (ref 39.6–80.0)
Platelets: 276 10*3/uL (ref 145–400)
RBC: 5.08 10*6/uL (ref 3.70–5.32)
RDW: 13.3 % (ref 11.1–15.7)
WBC: 6.5 10*3/uL (ref 3.9–10.0)

## 2015-02-03 LAB — CHCC SATELLITE - SMEAR

## 2015-02-03 NOTE — Progress Notes (Signed)
Hematology and Oncology Follow Up Visit  BAYLOR TEEGARDEN [id number] November 10, 1961 54 y.o. 02/03/2015   Principle Diagnosis:   Recurrent iron deficiency anemia  Current Therapy:    IV iron as indicated-last dose in September 2015     Interim History:  Ms.  Norment is in for followup. She's doing well. She enjoyed the holidays. She is with her family.  She is still working. She is enjoying this.  She last got iron back in September. Her last iron studies back in January showed a ferritin of 344 with iron saturation of 31%.  She feels well. She does feel bit short winded with exercise. I think most of this has to do with her weight.  She is again ready for 30th wedding anniversary in September. She husband will be going on a vacation.  She's had no bleeding. Her appetite has been good. She's had no nausea or vomiting. There's been no rashes. She's had no leg swelling.   Medications:  Current outpatient prescriptions:  .  acetaminophen (TYLENOL) 325 MG tablet, Take 325 mg by mouth as needed. , Disp: , Rfl:  .  diphenhydrAMINE (BENADRYL) 25 MG tablet, Take 25 mg by mouth at bedtime as needed., Disp: , Rfl:  .  Fexofenadine-Pseudoephedrine (ALLEGRA-D 24 HOUR PO), Take by mouth daily.  , Disp: , Rfl:  .  omeprazole (PRILOSEC) 10 MG capsule, Take 20 mg by mouth daily. , Disp: , Rfl:   Allergies:  Allergies  Allergen Reactions  . Ciprocinonide [Fluocinolone]   . Penicillins   . Sulfa Antibiotics     Past Medical History, Surgical history, Social history, and Family History were reviewed and updated.  Review of Systems: As above  Physical Exam:  vitals were not taken for this visit. her vital signs show temperature of 98.1. Pulse 94. Blood pressure 154/96. Weight is 190 pounds.  Head and neck exam shows no ocular or oral lesions. There is no adenopathy in the neck. Lungs are clear. Cardiac exam regular rate and rhythm with no murmurs, rubs or bruits. Abdomen is soft. She has good bowel  sounds. There is no fluid wave. There is no palpable liver or spleen tip. Back exam shows no tenderness over the spine ribs or hips. Extremities shows no clubbing cyanosis or edema. She has good range of motion of her joints. Skin exam no rashes ecchymoses or petechia. Neurological exam is nonfocal.  Lab Results  Component Value Date   WBC 6.5 02/03/2015   HGB 14.8 02/03/2015   HCT 44.8 02/03/2015   MCV 88 02/03/2015   PLT 276 02/03/2015     Chemistry      Component Value Date/Time   NA 139 01/19/2014 1058   NA 141 09/02/2012 0859   K 3.9 01/19/2014 1058   K 3.8 09/02/2012 0859   CL 104 01/19/2014 1058   CL 105 09/02/2012 0859   CO2 23 01/19/2014 1058   CO2 26 09/02/2012 0859   BUN 14 01/19/2014 1058   BUN 13 09/02/2012 0859   CREATININE 0.59 01/19/2014 1058   CREATININE 0.6 09/02/2012 0859      Component Value Date/Time   CALCIUM 9.6 01/19/2014 1058   CALCIUM 9.2 09/02/2012 0859   ALKPHOS 90 01/19/2014 1058   ALKPHOS 100* 09/02/2012 0859   AST 9 01/19/2014 1058   AST 18 09/02/2012 0859   ALT 11 01/19/2014 1058   ALT 17 09/02/2012 0859   BILITOT 0.4 01/19/2014 1058   BILITOT 0.70 09/02/2012 0859  Impression and Plan: Ms. Dowson is a 54 year old white female with recurrent iron deficiency anemia. Her blood counts actually are doing pretty well. Her hemoglobin is holding up pretty nicely.  I would suspect that her iron studies should be okay. It is been 5 months since she has had iron. We will have to see how things look.  We will probably go back in about 4 months. She always will let us know if she needs any blood work in between visits.  Again, I would think that her iron studies are okay.   Volanda Napoleon, MD 2/11/20164:52 PM

## 2015-02-04 LAB — FERRITIN CHCC: FERRITIN: 302 ng/mL — AB (ref 9–269)

## 2015-02-04 LAB — IRON AND TIBC CHCC
%SAT: 14 % — ABNORMAL LOW (ref 21–57)
IRON: 37 ug/dL — AB (ref 41–142)
TIBC: 260 ug/dL (ref 236–444)
UIBC: 223 ug/dL (ref 120–384)

## 2015-02-04 LAB — RETICULOCYTES (CHCC)
ABS Retic: 116.6 10*3/uL (ref 19.0–186.0)
RBC.: 5.07 MIL/uL (ref 3.87–5.11)
Retic Ct Pct: 2.3 % (ref 0.4–2.3)

## 2015-02-15 ENCOUNTER — Telehealth: Payer: Self-pay | Admitting: *Deleted

## 2015-02-15 NOTE — Telephone Encounter (Signed)
Feels weak. Wants iron infusion. Reviewed labs with Judson Roch NP and appointment made for patient to come in for IV iron.

## 2015-02-17 ENCOUNTER — Ambulatory Visit (HOSPITAL_BASED_OUTPATIENT_CLINIC_OR_DEPARTMENT_OTHER): Payer: BC Managed Care – PPO

## 2015-02-17 VITALS — BP 170/107 | HR 73 | Temp 98.2°F | Resp 20

## 2015-02-17 DIAGNOSIS — D509 Iron deficiency anemia, unspecified: Secondary | ICD-10-CM

## 2015-02-17 MED ORDER — METHYLPREDNISOLONE SODIUM SUCC 125 MG IJ SOLR
INTRAMUSCULAR | Status: AC
  Filled 2015-02-17: qty 2

## 2015-02-17 MED ORDER — METHYLPREDNISOLONE SODIUM SUCC 125 MG IJ SOLR
125.0000 mg | Freq: Once | INTRAMUSCULAR | Status: AC
  Administered 2015-02-17: 125 mg via INTRAVENOUS

## 2015-02-17 MED ORDER — SODIUM CHLORIDE 0.9 % IV SOLN
Freq: Once | INTRAVENOUS | Status: AC
  Administered 2015-02-17: 14:00:00 via INTRAVENOUS

## 2015-02-17 MED ORDER — SODIUM CHLORIDE 0.9 % IV SOLN
510.0000 mg | Freq: Once | INTRAVENOUS | Status: AC
  Administered 2015-02-17: 510 mg via INTRAVENOUS
  Filled 2015-02-17: qty 17

## 2015-02-17 NOTE — Patient Instructions (Signed)

## 2015-04-29 ENCOUNTER — Other Ambulatory Visit: Payer: Self-pay | Admitting: Hematology & Oncology

## 2015-04-29 DIAGNOSIS — D509 Iron deficiency anemia, unspecified: Secondary | ICD-10-CM

## 2015-05-02 ENCOUNTER — Other Ambulatory Visit (HOSPITAL_BASED_OUTPATIENT_CLINIC_OR_DEPARTMENT_OTHER): Payer: BC Managed Care – PPO

## 2015-05-02 DIAGNOSIS — I10 Essential (primary) hypertension: Secondary | ICD-10-CM

## 2015-05-02 DIAGNOSIS — D509 Iron deficiency anemia, unspecified: Secondary | ICD-10-CM | POA: Diagnosis not present

## 2015-05-02 LAB — CBC WITH DIFFERENTIAL (CANCER CENTER ONLY)
BASO#: 0 10*3/uL (ref 0.0–0.2)
BASO%: 0.3 % (ref 0.0–2.0)
EOS ABS: 0.1 10*3/uL (ref 0.0–0.5)
EOS%: 2.5 % (ref 0.0–7.0)
HEMATOCRIT: 44.1 % (ref 34.8–46.6)
HEMOGLOBIN: 14.8 g/dL (ref 11.6–15.9)
LYMPH#: 1.6 10*3/uL (ref 0.9–3.3)
LYMPH%: 44.9 % (ref 14.0–48.0)
MCH: 29.6 pg (ref 26.0–34.0)
MCHC: 33.6 g/dL (ref 32.0–36.0)
MCV: 88 fL (ref 81–101)
MONO#: 0.3 10*3/uL (ref 0.1–0.9)
MONO%: 9.1 % (ref 0.0–13.0)
NEUT%: 43.2 % (ref 39.6–80.0)
NEUTROS ABS: 1.6 10*3/uL (ref 1.5–6.5)
Platelets: 270 10*3/uL (ref 145–400)
RBC: 5 10*6/uL (ref 3.70–5.32)
RDW: 13.4 % (ref 11.1–15.7)
WBC: 3.6 10*3/uL — AB (ref 3.9–10.0)

## 2015-05-02 LAB — IRON AND TIBC CHCC
%SAT: 30 % (ref 21–57)
Iron: 79 ug/dL (ref 41–142)
TIBC: 261 ug/dL (ref 236–444)
UIBC: 183 ug/dL (ref 120–384)

## 2015-05-02 LAB — FERRITIN CHCC: Ferritin: 242 ng/ml (ref 9–269)

## 2015-05-03 ENCOUNTER — Telehealth: Payer: Self-pay | Admitting: *Deleted

## 2015-05-03 LAB — COMPREHENSIVE METABOLIC PANEL
ALT: 13 U/L (ref 0–35)
AST: 11 U/L (ref 0–37)
Albumin: 4.2 g/dL (ref 3.5–5.2)
Alkaline Phosphatase: 90 U/L (ref 39–117)
BILIRUBIN TOTAL: 0.5 mg/dL (ref 0.2–1.2)
BUN: 13 mg/dL (ref 6–23)
CO2: 28 mEq/L (ref 19–32)
CREATININE: 0.7 mg/dL (ref 0.50–1.10)
Calcium: 9.6 mg/dL (ref 8.4–10.5)
Chloride: 106 mEq/L (ref 96–112)
Glucose, Bld: 85 mg/dL (ref 70–99)
Potassium: 3.9 mEq/L (ref 3.5–5.3)
Sodium: 142 mEq/L (ref 135–145)
Total Protein: 6.8 g/dL (ref 6.0–8.3)

## 2015-05-03 LAB — RETICULOCYTES (CHCC)
ABS RETIC: 110 10*3/uL (ref 19.0–186.0)
RBC.: 5 MIL/uL (ref 3.87–5.11)
Retic Ct Pct: 2.2 % (ref 0.4–2.3)

## 2015-05-03 NOTE — Telephone Encounter (Addendum)
Patient aware of results.   ----- Message from Volanda Napoleon, MD sent at 05/02/2015  6:30 PM EDT ----- Call and let her know that the iron level looks good. Thanks

## 2015-06-02 ENCOUNTER — Ambulatory Visit (HOSPITAL_BASED_OUTPATIENT_CLINIC_OR_DEPARTMENT_OTHER): Payer: BLUE CROSS/BLUE SHIELD | Admitting: Hematology & Oncology

## 2015-06-02 ENCOUNTER — Other Ambulatory Visit (HOSPITAL_BASED_OUTPATIENT_CLINIC_OR_DEPARTMENT_OTHER): Payer: BLUE CROSS/BLUE SHIELD

## 2015-06-02 ENCOUNTER — Encounter: Payer: Self-pay | Admitting: Hematology & Oncology

## 2015-06-02 VITALS — BP 158/111 | HR 75 | Temp 97.9°F | Wt 187.0 lb

## 2015-06-02 DIAGNOSIS — D509 Iron deficiency anemia, unspecified: Secondary | ICD-10-CM

## 2015-06-02 DIAGNOSIS — I10 Essential (primary) hypertension: Secondary | ICD-10-CM

## 2015-06-02 LAB — RETICULOCYTES (CHCC)
ABS RETIC: 93.1 10*3/uL (ref 19.0–186.0)
RBC.: 5.17 MIL/uL — ABNORMAL HIGH (ref 3.87–5.11)
Retic Ct Pct: 1.8 % (ref 0.4–2.3)

## 2015-06-02 LAB — CBC WITH DIFFERENTIAL (CANCER CENTER ONLY)
BASO#: 0 10*3/uL (ref 0.0–0.2)
BASO%: 0.3 % (ref 0.0–2.0)
EOS%: 1.7 % (ref 0.0–7.0)
Eosinophils Absolute: 0.1 10*3/uL (ref 0.0–0.5)
HEMATOCRIT: 45.2 % (ref 34.8–46.6)
HEMOGLOBIN: 15.2 g/dL (ref 11.6–15.9)
LYMPH#: 2.1 10*3/uL (ref 0.9–3.3)
LYMPH%: 35.7 % (ref 14.0–48.0)
MCH: 29.3 pg (ref 26.0–34.0)
MCHC: 33.6 g/dL (ref 32.0–36.0)
MCV: 87 fL (ref 81–101)
MONO#: 0.5 10*3/uL (ref 0.1–0.9)
MONO%: 7.9 % (ref 0.0–13.0)
NEUT#: 3.2 10*3/uL (ref 1.5–6.5)
NEUT%: 54.4 % (ref 39.6–80.0)
PLATELETS: 272 10*3/uL (ref 145–400)
RBC: 5.19 10*6/uL (ref 3.70–5.32)
RDW: 13 % (ref 11.1–15.7)
WBC: 6 10*3/uL (ref 3.9–10.0)

## 2015-06-02 MED ORDER — CLONIDINE HCL 0.2 MG PO TABS: 0.2000 mg | ORAL_TABLET | Freq: Two times a day (BID) | ORAL | Status: AC

## 2015-06-02 NOTE — Progress Notes (Signed)
Hematology and Oncology Follow Up Visit  Jamie Mcdowell [id number] 1961/06/07 54 y.o. 06/02/2015   Principle Diagnosis:   Recurrent iron deficiency anemia  Hypertension  Current Therapy:    IV iron as indicated-last dose in September 2015     Interim History:  Ms.  Jamie Mcdowell is in for followup. She is not feeling well. She said that she just doesn't have a lot of energy. She feels tired. She has low bit of shortness of breath. She feels as if her iron might be low.  She says that she does have some occasional blurred vision. She has no obvious headache. There is no chest wall pain. There is no change in bowel or bladder habits.  She's had no bleeding. She's had no nausea or vomiting. She's had no cough. His been no fever. She's had no leg swelling. There's been no rashes.  She last got iron back in February.  Medications:  Current outpatient prescriptions:  .  acetaminophen (TYLENOL) 325 MG tablet, Take 325 mg by mouth as needed. , Disp: , Rfl:  .  diphenhydrAMINE (BENADRYL) 25 MG tablet, Take 25 mg by mouth at bedtime as needed., Disp: , Rfl:  .  omeprazole (PRILOSEC) 10 MG capsule, Take 20 mg by mouth daily. , Disp: , Rfl:  .  cloNIDine (CATAPRES) 0.2 MG tablet, Take 1 tablet (0.2 mg total) by mouth 2 (two) times daily., Disp: 60 tablet, Rfl: 1 .  Fexofenadine-Pseudoephedrine (ALLEGRA-D 24 HOUR PO), Take by mouth daily.  , Disp: , Rfl:   Allergies:  Allergies  Allergen Reactions  . Ciprocinonide [Fluocinolone]   . Penicillins   . Sulfa Antibiotics     Past Medical History, Surgical history, Social history, and Family History were reviewed and updated.  Review of Systems: As above  Physical Exam:  weight is 187 lb (84.823 kg). Her oral temperature is 97.9 F (36.6 C). Her blood pressure is 158/111 and her pulse is 75.  I checked her blood pressure twice. In the right arm, I got a blood pressure of 220/130. In the left arm, her blood pressure was 230/140.  Head and neck exam  shows no ocular or oral lesions. I tried to look into her pupils. I really could not see too well. I cannot tell if there was any papilledema. There is no adenopathy in the neck. Lungs are clear. Cardiac exam regular rate and rhythm with no murmurs, rubs or bruits. Abdomen is soft. She has good bowel sounds. There is no fluid wave. There is no palpable liver or spleen tip. Back exam shows no tenderness over the spine ribs or hips. Extremities shows no clubbing cyanosis or edema. She has good range of motion of her joints. Skin exam no rashes ecchymoses or petechia. Neurological exam is nonfocal.  Lab Results  Component Value Date   WBC 6.0 06/02/2015   HGB 15.2 06/02/2015   HCT 45.2 06/02/2015   MCV 87 06/02/2015   PLT 272 06/02/2015     Chemistry      Component Value Date/Time   NA 142 05/02/2015 1105   NA 141 09/02/2012 0859   K 3.9 05/02/2015 1105   K 3.8 09/02/2012 0859   CL 106 05/02/2015 1105   CL 105 09/02/2012 0859   CO2 28 05/02/2015 1105   CO2 26 09/02/2012 0859   BUN 13 05/02/2015 1105   BUN 13 09/02/2012 0859   CREATININE 0.70 05/02/2015 1105   CREATININE 0.6 09/02/2012 0859      Component  Value Date/Time   CALCIUM 9.6 05/02/2015 1105   CALCIUM 9.2 09/02/2012 0859   ALKPHOS 90 05/02/2015 1105   ALKPHOS 100* 09/02/2012 0859   AST 11 05/02/2015 1105   AST 18 09/02/2012 0859   ALT 13 05/02/2015 1105   ALT 17 09/02/2012 0859   BILITOT 0.5 05/02/2015 1105   BILITOT 0.70 09/02/2012 0859         Impression and Plan: [name] is a 54 year old white female with recurrent iron deficiency anemia. Her blood counts actually are doing pretty well. Her hemoglobin is holding up pretty nicely.  I think that her bigger problem is her blood pressure. Again, I did her blood pressure manually twice. I did one in each arm. I consistently got her systolic blood pressure about 220-to 30 and her diastolic of 3:71-696.  I really think this is why she doesn't feel well. I'll be  surprised if her iron was low.  I gave her a prescription for clonidine. I will have her start taking that today.  Told that she hast to see her family doctor tomorrow. Dr. Tamala Julian is a very good internist so I'm sure that she will be able to better manage her blood pressure issues.  I want to see her back in 2 months.   I spent about 35-40 minutes with her trying to get this blood pressure situation attended to.   Volanda Napoleon, MD 6/9/20165:25 PM

## 2015-06-03 LAB — FERRITIN CHCC: FERRITIN: 268 ng/mL (ref 9–269)

## 2015-06-03 LAB — IRON AND TIBC CHCC
%SAT: 19 % — AB (ref 21–57)
IRON: 51 ug/dL (ref 41–142)
TIBC: 269 ug/dL (ref 236–444)
UIBC: 218 ug/dL (ref 120–384)

## 2015-06-06 ENCOUNTER — Telehealth: Payer: Self-pay | Admitting: *Deleted

## 2015-06-06 ENCOUNTER — Other Ambulatory Visit: Payer: Self-pay | Admitting: *Deleted

## 2015-06-06 DIAGNOSIS — D509 Iron deficiency anemia, unspecified: Secondary | ICD-10-CM

## 2015-06-06 MED ORDER — SODIUM CHLORIDE 0.9 % IV SOLN: Freq: Once | INTRAVENOUS | Status: DC

## 2015-06-06 MED ORDER — SODIUM CHLORIDE 0.9 % IV SOLN: 510.0000 mg | Freq: Once | INTRAVENOUS | Status: DC

## 2015-06-06 NOTE — Telephone Encounter (Signed)
-----   Message from Jamie Napoleon, MD sent at 06/03/2015  4:54 PM EDT ----- I left a message on her cell phone. I told her that her iron level was a little down. I told her that we couldn't give her an iron infusion. If she feels that this would help, then she can call us and let us know.  I also asked about her blood pressure. When we saw her yesterday, her blood pressure was incredibly elevated. I put her on clonidine. She is supposed to see her family doctor today. I just wanted to see how that went.  I told her to call us on Monday to let us know about the iron and to let us know about her blood pressure.  pete

## 2015-06-06 NOTE — Telephone Encounter (Signed)
Patient is seeing her primary care physician today for high blood pressure

## 2015-06-06 NOTE — Progress Notes (Signed)
Orders received for iron infusion

## 2015-06-09 ENCOUNTER — Ambulatory Visit (HOSPITAL_BASED_OUTPATIENT_CLINIC_OR_DEPARTMENT_OTHER): Payer: BLUE CROSS/BLUE SHIELD

## 2015-06-09 VITALS — BP 135/85 | HR 64 | Temp 98.2°F | Resp 18

## 2015-06-09 DIAGNOSIS — D509 Iron deficiency anemia, unspecified: Secondary | ICD-10-CM | POA: Diagnosis not present

## 2015-06-09 MED ORDER — SODIUM CHLORIDE 0.9 % IV SOLN
510.0000 mg | Freq: Once | INTRAVENOUS | Status: AC
  Administered 2015-06-09: 510 mg via INTRAVENOUS
  Filled 2015-06-09: qty 17

## 2015-06-09 MED ORDER — METHYLPREDNISOLONE SODIUM SUCC 125 MG IJ SOLR
125.0000 mg | Freq: Once | INTRAMUSCULAR | Status: AC
  Administered 2015-06-09: 125 mg via INTRAVENOUS

## 2015-06-09 MED ORDER — METHYLPREDNISOLONE SODIUM SUCC 125 MG IJ SOLR
INTRAMUSCULAR | Status: AC
  Filled 2015-06-09: qty 2

## 2015-06-09 MED ORDER — SODIUM CHLORIDE 0.9 % IV SOLN
Freq: Once | INTRAVENOUS | Status: AC
  Administered 2015-06-09: 12:00:00 via INTRAVENOUS

## 2015-06-09 NOTE — Patient Instructions (Signed)

## 2015-08-04 ENCOUNTER — Encounter: Payer: Self-pay | Admitting: Family

## 2015-08-04 ENCOUNTER — Other Ambulatory Visit (HOSPITAL_BASED_OUTPATIENT_CLINIC_OR_DEPARTMENT_OTHER): Payer: BLUE CROSS/BLUE SHIELD

## 2015-08-04 ENCOUNTER — Ambulatory Visit (HOSPITAL_BASED_OUTPATIENT_CLINIC_OR_DEPARTMENT_OTHER): Payer: BLUE CROSS/BLUE SHIELD | Admitting: Hematology & Oncology

## 2015-08-04 VITALS — BP 146/96 | HR 71 | Temp 97.9°F | Resp 16 | Ht 65.0 in | Wt 187.0 lb

## 2015-08-04 DIAGNOSIS — I10 Essential (primary) hypertension: Secondary | ICD-10-CM | POA: Diagnosis not present

## 2015-08-04 DIAGNOSIS — D509 Iron deficiency anemia, unspecified: Secondary | ICD-10-CM | POA: Diagnosis not present

## 2015-08-04 LAB — CBC WITH DIFFERENTIAL (CANCER CENTER ONLY)
BASO#: 0 10*3/uL (ref 0.0–0.2)
BASO%: 0.6 % (ref 0.0–2.0)
EOS ABS: 0.1 10*3/uL (ref 0.0–0.5)
EOS%: 1.6 % (ref 0.0–7.0)
HCT: 36.2 % (ref 34.8–46.6)
HGB: 11.7 g/dL (ref 11.6–15.9)
LYMPH#: 1.7 10*3/uL (ref 0.9–3.3)
LYMPH%: 34 % (ref 14.0–48.0)
MCH: 29.7 pg (ref 26.0–34.0)
MCHC: 32.3 g/dL (ref 32.0–36.0)
MCV: 92 fL (ref 81–101)
MONO#: 0.4 10*3/uL (ref 0.1–0.9)
MONO%: 8.6 % (ref 0.0–13.0)
NEUT%: 55.2 % (ref 39.6–80.0)
NEUTROS ABS: 2.7 10*3/uL (ref 1.5–6.5)
PLATELETS: 312 10*3/uL (ref 145–400)
RBC: 3.94 10*6/uL (ref 3.70–5.32)
RDW: 15.4 % (ref 11.1–15.7)
WBC: 4.9 10*3/uL (ref 3.9–10.0)

## 2015-08-04 LAB — BASIC METABOLIC PANEL
BUN: 15 mg/dL (ref 7–25)
CALCIUM: 10.1 mg/dL (ref 8.6–10.4)
CO2: 26 mmol/L (ref 20–31)
CREATININE: 0.77 mg/dL (ref 0.50–1.05)
Chloride: 111 mmol/L — ABNORMAL HIGH (ref 98–110)
GLUCOSE: 86 mg/dL (ref 65–99)
Potassium: 5.5 mmol/L — ABNORMAL HIGH (ref 3.5–5.3)
Sodium: 147 mmol/L — ABNORMAL HIGH (ref 135–146)

## 2015-08-04 LAB — RETICULOCYTES (CHCC)
ABS RETIC: 239.4 10*3/uL — AB (ref 19.0–186.0)
RBC.: 3.99 MIL/uL (ref 3.87–5.11)
Retic Ct Pct: 6 % — ABNORMAL HIGH (ref 0.4–2.3)

## 2015-08-04 NOTE — Progress Notes (Signed)
Hematology and Oncology Follow Up Visit  Jamie Mcdowell [id number] 1961/07/30 54 y.o. 08/04/2015   Principle Diagnosis:   Recurrent iron deficiency anemia  Hypertension  Current Therapy:    IV iron as indicated-last dose in June 2016     Interim History:  Jamie Mcdowell is in for followup. She is doing better. Her blood pressure is much better controlled. We started her on clonidine. This is brought it down a little bit. Past she's been followed closely by Dr. Tamala Julian who is doing a great job with her blood pressure.  She did get some iron back in June. At that point time, her iron saturation was only 19%.  She's had noted any bleeding. Her appetite has been good.  She's looking [place] to September when she and her husband are going down to the [place] for their anniversary. She wants to feel well when she goes down.  She's had no cough. There's been no fever. She's had no sweats. She's had no rashes. She's had no leg swelling.  Overall, her performance status is ECOG 0.  Medications:  Current outpatient prescriptions:  .  cloNIDine (CATAPRES) 0.2 MG tablet, Take 1 tablet (0.2 mg total) by mouth 2 (two) times daily., Disp: 60 tablet, Rfl: 1 .  Fexofenadine-Pseudoephedrine (ALLEGRA-D 24 HOUR PO), Take by mouth daily.  , Disp: , Rfl:  .  omeprazole (PRILOSEC) 10 MG capsule, Take 20 mg by mouth daily. , Disp: , Rfl:   Allergies:  Allergies  Allergen Reactions  . Ciprocinonide [Fluocinolone]   . Penicillins   . Sulfa Antibiotics     Past Medical History, Surgical history, Social history, and Family History were reviewed and updated.  Review of Systems: As above  Physical Exam:  height is 5\' 5"  (1.651 m) and weight is 187 lb (84.823 kg). Her oral temperature is 97.9 F (36.6 C). Her blood pressure is 146/96 and her pulse is 71. Her respiration is 16.  I checked her blood pressure twice. In the right arm, I got a blood pressure of 220/130. In the left arm, her blood  pressure was 230/140.  Head and neck exam shows no ocular or oral lesions. . There is no adenopathy in the neck. Lungs are clear. Cardiac exam regular rate and rhythm with no murmurs, rubs or bruits. Abdomen is soft. She has good bowel sounds. There is no fluid wave. There is no palpable liver or spleen tip. Back exam shows no tenderness over the spine ribs or hips. Extremities shows no clubbing cyanosis or edema. She has good range of motion of her joints. Skin exam no rashes ecchymoses or petechia. Neurological exam is nonfocal.  Lab Results  Component Value Date   WBC 4.9 08/04/2015   HGB 11.7 08/04/2015   HCT 36.2 08/04/2015   MCV 92 08/04/2015   PLT 312 08/04/2015     Chemistry      Component Value Date/Time   NA 142 05/02/2015 1105   NA 141 09/02/2012 0859   K 3.9 05/02/2015 1105   K 3.8 09/02/2012 0859   CL 106 05/02/2015 1105   CL 105 09/02/2012 0859   CO2 28 05/02/2015 1105   CO2 26 09/02/2012 0859   BUN 13 05/02/2015 1105   BUN 13 09/02/2012 0859   CREATININE 0.70 05/02/2015 1105   CREATININE 0.6 09/02/2012 0859      Component Value Date/Time   CALCIUM 9.6 05/02/2015 1105   CALCIUM 9.2 09/02/2012 0859   ALKPHOS 90 05/02/2015 1105  ALKPHOS 100* 09/02/2012 0859   AST 11 05/02/2015 1105   AST 18 09/02/2012 0859   ALT 13 05/02/2015 1105   ALT 17 09/02/2012 0859   BILITOT 0.5 05/02/2015 1105   BILITOT 0.70 09/02/2012 0859         Impression and Plan: Jamie Mcdowell is a 54 year old white female with recurrent iron deficiency anemia. Despite getting the iron in June, her hemoglobin is down a little bit. I think some of this is because of the hypertension. I told her that with high blood pressure, this affects how the kidneys can control the blood counts.  He'll be interesting to see what her iron studies show.  I probably will plan to get her back in another 2-3 months. I still think she needs to have a close follow-up.  Once again her iron studies back, then we  will throughout 1 to get her back to the office.   I spent about 35-40 minutes with her. Volanda Napoleon, MD 8/11/20164:23 PM

## 2015-08-05 ENCOUNTER — Other Ambulatory Visit: Payer: Self-pay | Admitting: *Deleted

## 2015-08-05 ENCOUNTER — Telehealth: Payer: Self-pay | Admitting: Hematology & Oncology

## 2015-08-05 ENCOUNTER — Telehealth: Payer: Self-pay | Admitting: *Deleted

## 2015-08-05 DIAGNOSIS — D509 Iron deficiency anemia, unspecified: Secondary | ICD-10-CM

## 2015-08-05 LAB — IRON AND TIBC CHCC
%SAT: 15 % — ABNORMAL LOW (ref 21–57)
Iron: 40 ug/dL — ABNORMAL LOW (ref 41–142)
TIBC: 266 ug/dL (ref 236–444)
UIBC: 227 ug/dL (ref 120–384)

## 2015-08-05 LAB — FERRITIN CHCC: Ferritin: 196 ng/ml (ref 9–269)

## 2015-08-05 NOTE — Telephone Encounter (Addendum)
Patient aware of results  ----- Message from Volanda Napoleon, MD sent at 08/05/2015  1:38 PM EDT ----- Please call and tell her that the iron level is back low again. We need to get her in for Feraheme time 2 doses. I want to make sure that her iron level is okay before she goes to the [place] in September. Thanks

## 2015-08-05 NOTE — Telephone Encounter (Addendum)
Attempted to call times two. Will send message to scheduler.   ----- Message from Volanda Napoleon, MD sent at 08/05/2015  1:38 PM EDT ----- Please call and tell her that the iron level is back low again. We need to get her in for Feraheme time 2 doses. I want to make sure that her iron level is okay before she goes to the [place] in September. Thanks

## 2015-08-05 NOTE — Telephone Encounter (Signed)
Lt mess regarding iron infusion appt 8/18

## 2015-08-10 ENCOUNTER — Telehealth: Payer: Self-pay | Admitting: Hematology & Oncology

## 2015-08-10 NOTE — Telephone Encounter (Signed)
Lt mess regarding why she has the 3 appt  In reference to the [facility].

## 2015-08-11 ENCOUNTER — Ambulatory Visit (HOSPITAL_BASED_OUTPATIENT_CLINIC_OR_DEPARTMENT_OTHER): Payer: BLUE CROSS/BLUE SHIELD

## 2015-08-11 VITALS — BP 122/81 | HR 59 | Temp 98.2°F | Resp 18

## 2015-08-11 DIAGNOSIS — D509 Iron deficiency anemia, unspecified: Secondary | ICD-10-CM | POA: Diagnosis not present

## 2015-08-11 MED ORDER — SODIUM CHLORIDE 0.9 % IV SOLN
510.0000 mg | Freq: Once | INTRAVENOUS | Status: AC
  Administered 2015-08-11: 510 mg via INTRAVENOUS
  Filled 2015-08-11: qty 17

## 2015-08-11 MED ORDER — METHYLPREDNISOLONE SODIUM SUCC 125 MG IJ SOLR
INTRAMUSCULAR | Status: AC
  Filled 2015-08-11: qty 2

## 2015-08-11 MED ORDER — METHYLPREDNISOLONE SODIUM SUCC 125 MG IJ SOLR
125.0000 mg | Freq: Once | INTRAMUSCULAR | Status: AC
  Administered 2015-08-11: 125 mg via INTRAVENOUS

## 2015-08-11 MED ORDER — SODIUM CHLORIDE 0.9 % IV SOLN
INTRAVENOUS | Status: DC
  Administered 2015-08-11: 15:00:00 via INTRAVENOUS

## 2015-08-11 NOTE — Patient Instructions (Signed)

## 2015-08-18 ENCOUNTER — Ambulatory Visit (HOSPITAL_BASED_OUTPATIENT_CLINIC_OR_DEPARTMENT_OTHER): Payer: BLUE CROSS/BLUE SHIELD

## 2015-08-18 VITALS — BP 125/80 | HR 63 | Temp 98.2°F | Resp 18

## 2015-08-18 DIAGNOSIS — D509 Iron deficiency anemia, unspecified: Secondary | ICD-10-CM | POA: Diagnosis not present

## 2015-08-18 MED ORDER — SODIUM CHLORIDE 0.9 % IV SOLN
510.0000 mg | Freq: Once | INTRAVENOUS | Status: AC
  Administered 2015-08-18: 510 mg via INTRAVENOUS
  Filled 2015-08-18: qty 17

## 2015-08-18 MED ORDER — SODIUM CHLORIDE 0.9 % IV SOLN
INTRAVENOUS | Status: DC
  Administered 2015-08-18: 11:00:00 via INTRAVENOUS

## 2015-08-18 NOTE — Patient Instructions (Signed)

## 2015-08-25 ENCOUNTER — Ambulatory Visit: Payer: Self-pay

## 2015-08-26 ENCOUNTER — Other Ambulatory Visit: Payer: Self-pay

## 2015-09-22 ENCOUNTER — Other Ambulatory Visit: Payer: Self-pay

## 2015-09-22 ENCOUNTER — Ambulatory Visit: Payer: Self-pay | Admitting: Hematology & Oncology

## 2015-10-03 ENCOUNTER — Ambulatory Visit (HOSPITAL_BASED_OUTPATIENT_CLINIC_OR_DEPARTMENT_OTHER): Payer: BLUE CROSS/BLUE SHIELD

## 2015-10-03 ENCOUNTER — Other Ambulatory Visit: Payer: BLUE CROSS/BLUE SHIELD

## 2015-10-03 ENCOUNTER — Other Ambulatory Visit: Payer: Self-pay | Admitting: Family

## 2015-10-03 DIAGNOSIS — D509 Iron deficiency anemia, unspecified: Secondary | ICD-10-CM

## 2015-10-03 LAB — CBC WITH DIFFERENTIAL (CANCER CENTER ONLY)
BASO#: 0 10*3/uL (ref 0.0–0.2)
BASO%: 0.4 % (ref 0.0–2.0)
EOS%: 1.5 % (ref 0.0–7.0)
Eosinophils Absolute: 0.1 10*3/uL (ref 0.0–0.5)
HEMATOCRIT: 43.7 % (ref 34.8–46.6)
HEMOGLOBIN: 14.5 g/dL (ref 11.6–15.9)
LYMPH#: 1.9 10*3/uL (ref 0.9–3.3)
LYMPH%: 34.8 % (ref 14.0–48.0)
MCH: 29.2 pg (ref 26.0–34.0)
MCHC: 33.2 g/dL (ref 32.0–36.0)
MCV: 88 fL (ref 81–101)
MONO#: 0.5 10*3/uL (ref 0.1–0.9)
MONO%: 8.4 % (ref 0.0–13.0)
NEUT%: 54.9 % (ref 39.6–80.0)
NEUTROS ABS: 2.9 10*3/uL (ref 1.5–6.5)
Platelets: 259 10*3/uL (ref 145–400)
RBC: 4.96 10*6/uL (ref 3.70–5.32)
RDW: 13.6 % (ref 11.1–15.7)
WBC: 5.4 10*3/uL (ref 3.9–10.0)

## 2015-10-04 ENCOUNTER — Other Ambulatory Visit: Payer: Self-pay

## 2015-10-04 LAB — IRON AND TIBC CHCC
%SAT: 22 % (ref 21–57)
IRON: 54 ug/dL (ref 41–142)
TIBC: 245 ug/dL (ref 236–444)
UIBC: 191 ug/dL (ref 120–384)

## 2015-10-04 LAB — FERRITIN CHCC: FERRITIN: 526 ng/mL — AB (ref 9–269)

## 2015-10-26 ENCOUNTER — Other Ambulatory Visit: Payer: Self-pay | Admitting: *Deleted

## 2015-10-26 DIAGNOSIS — D509 Iron deficiency anemia, unspecified: Secondary | ICD-10-CM

## 2015-10-27 ENCOUNTER — Other Ambulatory Visit (HOSPITAL_BASED_OUTPATIENT_CLINIC_OR_DEPARTMENT_OTHER): Payer: BLUE CROSS/BLUE SHIELD

## 2015-10-27 ENCOUNTER — Ambulatory Visit (HOSPITAL_BASED_OUTPATIENT_CLINIC_OR_DEPARTMENT_OTHER): Payer: BLUE CROSS/BLUE SHIELD | Admitting: Hematology & Oncology

## 2015-10-27 ENCOUNTER — Encounter: Payer: Self-pay | Admitting: Hematology & Oncology

## 2015-10-27 VITALS — BP 157/91 | HR 76 | Temp 98.6°F | Resp 16 | Ht 65.0 in | Wt 184.0 lb

## 2015-10-27 DIAGNOSIS — D509 Iron deficiency anemia, unspecified: Secondary | ICD-10-CM | POA: Diagnosis not present

## 2015-10-27 DIAGNOSIS — I10 Essential (primary) hypertension: Secondary | ICD-10-CM

## 2015-10-27 LAB — CBC WITH DIFFERENTIAL (CANCER CENTER ONLY)
BASO#: 0 10*3/uL (ref 0.0–0.2)
BASO%: 0.6 % (ref 0.0–2.0)
EOS%: 1.8 % (ref 0.0–7.0)
Eosinophils Absolute: 0.1 10*3/uL (ref 0.0–0.5)
HCT: 42.6 % (ref 34.8–46.6)
HGB: 14.3 g/dL (ref 11.6–15.9)
LYMPH#: 1.9 10*3/uL (ref 0.9–3.3)
LYMPH%: 37.9 % (ref 14.0–48.0)
MCH: 29.4 pg (ref 26.0–34.0)
MCHC: 33.6 g/dL (ref 32.0–36.0)
MCV: 88 fL (ref 81–101)
MONO#: 0.4 10*3/uL (ref 0.1–0.9)
MONO%: 8.7 % (ref 0.0–13.0)
NEUT#: 2.5 10*3/uL (ref 1.5–6.5)
NEUT%: 51 % (ref 39.6–80.0)
Platelets: 267 10*3/uL (ref 145–400)
RBC: 4.86 10*6/uL (ref 3.70–5.32)
RDW: 13.7 % (ref 11.1–15.7)
WBC: 5 10*3/uL (ref 3.9–10.0)

## 2015-10-27 NOTE — Progress Notes (Signed)
Hematology and Oncology Follow Up Visit  Jamie Mcdowell [id number] 12/17/1961 54 y.o. 10/27/2015   Principle Diagnosis:   Recurrent iron deficiency anemia  Hypertension  Current Therapy:    IV iron as indicated-last dose in June 2016     Interim History:  [name] is in for followup. She is quite stressed out. One of her daughters is having a tough time.  Her blood pressure has been on the high side. She is on clonidine for this. Dr. Tamala Julian is managing her blood pressure. F she and her husband had a great vacation down in the [place] and they went down I think September. She make sure she wore a lot of sunscreen.  She last got iron back in early September. She had 2 doses of Feraheme.  Her last iron studies done about 3 weeks ago showed a ferritin of 526 with iron saturation of 22%. This is quite good for Jamie Mcdowell.  She's had no bleeding. Her appetite is good. Her weight has been holding pretty stable.  Overall, her performance status is ECOG 0.  Medications:  Current outpatient prescriptions:  .  cloNIDine (CATAPRES) 0.2 MG tablet, Take 1 tablet (0.2 mg total) by mouth 2 (two) times daily., Disp: 60 tablet, Rfl: 1 .  Fexofenadine-Pseudoephedrine (ALLEGRA-D 24 HOUR PO), Take by mouth daily.  , Disp: , Rfl:  .  omeprazole (PRILOSEC) 10 MG capsule, Take 20 mg by mouth daily. , Disp: , Rfl:   Allergies:  Allergies  Allergen Reactions  . Ciprocinonide [Fluocinolone]   . Penicillins   . Sulfa Antibiotics     Past Medical History, Surgical history, Social history, and Family History were reviewed and updated.  Review of Systems: As above  Physical Exam:  height is 5\' 5"  (1.651 m) and weight is 184 lb (83.462 kg). Her oral temperature is 98.6 F (37 C). Her blood pressure is 157/91 and her pulse is 76. Her respiration is 16.  I checked her blood pressure twice. In the right arm, I got a blood pressure of 220/130. In the left arm, her blood pressure was  230/140.  Head and neck exam shows no ocular or oral lesions. . There is no adenopathy in the neck. Lungs are clear. Cardiac exam regular rate and rhythm with no murmurs, rubs or bruits. Abdomen is soft. She has good bowel sounds. There is no fluid wave. There is no palpable liver or spleen tip. Back exam shows no tenderness over the spine ribs or hips. Extremities shows no clubbing cyanosis or edema. She has good range of motion of her joints. Skin exam no rashes ecchymoses or petechia. Neurological exam is nonfocal.  Lab Results  Component Value Date   WBC 5.0 10/27/2015   HGB 14.3 10/27/2015   HCT 42.6 10/27/2015   MCV 88 10/27/2015   PLT 267 10/27/2015     Chemistry      Component Value Date/Time   NA 147* 08/04/2015 1427   NA 141 09/02/2012 0859   K 5.5* 08/04/2015 1427   K 3.8 09/02/2012 0859   CL 111* 08/04/2015 1427   CL 105 09/02/2012 0859   CO2 26 08/04/2015 1427   CO2 26 09/02/2012 0859   BUN 15 08/04/2015 1427   BUN 13 09/02/2012 0859   CREATININE 0.77 08/04/2015 1427   CREATININE 0.6 09/02/2012 0859      Component Value Date/Time   CALCIUM 10.1 08/04/2015 1427   CALCIUM 9.2 09/02/2012 0859   ALKPHOS 90  05/02/2015 1105   ALKPHOS 100* 09/02/2012 0859   AST 11 05/02/2015 1105   AST 18 09/02/2012 0859   ALT 13 05/02/2015 1105   ALT 17 09/02/2012 0859   BILITOT 0.5 05/02/2015 1105   BILITOT 0.70 09/02/2012 0859         Impression and Plan: Jamie Mcdowell is a 54 year old white female with recurrent iron deficiency anemia.   Her hemoglobin is holding pretty steady right now. Hopefully, her iron studies all looking good. We did give her 2 doses of iron I think back in late August or early September.  We will plan to get her back in another 3-4 months. The [facility] is a good time. For her before she has another iron infusion.   I spent about 35-40 minutes with her.   Volanda Napoleon, MD 11/3/20164:20 PM

## 2015-10-28 LAB — FERRITIN CHCC: FERRITIN: 366 ng/mL — AB (ref 9–269)

## 2015-10-28 LAB — IRON AND TIBC CHCC
%SAT: 25 % (ref 21–57)
IRON: 59 ug/dL (ref 41–142)
TIBC: 240 ug/dL (ref 236–444)
UIBC: 181 ug/dL (ref 120–384)

## 2015-10-31 ENCOUNTER — Encounter: Payer: Self-pay | Admitting: Nurse Practitioner

## 2015-12-08 ENCOUNTER — Telehealth: Payer: Self-pay | Admitting: *Deleted

## 2015-12-08 ENCOUNTER — Other Ambulatory Visit: Payer: Self-pay | Admitting: *Deleted

## 2015-12-08 DIAGNOSIS — D509 Iron deficiency anemia, unspecified: Secondary | ICD-10-CM

## 2015-12-08 NOTE — Telephone Encounter (Signed)
Patient is feeling symptomatic and wants to have her blood drawn early next week. Dr Marin Olp is fine with this. Patient has been scheduled.

## 2015-12-12 ENCOUNTER — Other Ambulatory Visit (HOSPITAL_BASED_OUTPATIENT_CLINIC_OR_DEPARTMENT_OTHER): Payer: BLUE CROSS/BLUE SHIELD

## 2015-12-12 DIAGNOSIS — D509 Iron deficiency anemia, unspecified: Secondary | ICD-10-CM

## 2015-12-12 LAB — CBC WITH DIFFERENTIAL (CANCER CENTER ONLY)
BASO#: 0 10*3/uL (ref 0.0–0.2)
BASO%: 0.6 % (ref 0.0–2.0)
EOS ABS: 0.1 10*3/uL (ref 0.0–0.5)
EOS%: 1.9 % (ref 0.0–7.0)
HCT: 42 % (ref 34.8–46.6)
HEMOGLOBIN: 13.6 g/dL (ref 11.6–15.9)
LYMPH#: 1.9 10*3/uL (ref 0.9–3.3)
LYMPH%: 36 % (ref 14.0–48.0)
MCH: 28.8 pg (ref 26.0–34.0)
MCHC: 32.4 g/dL (ref 32.0–36.0)
MCV: 89 fL (ref 81–101)
MONO#: 0.4 10*3/uL (ref 0.1–0.9)
MONO%: 6.7 % (ref 0.0–13.0)
NEUT%: 54.8 % (ref 39.6–80.0)
NEUTROS ABS: 2.9 10*3/uL (ref 1.5–6.5)
Platelets: 281 10*3/uL (ref 145–400)
RBC: 4.72 10*6/uL (ref 3.70–5.32)
RDW: 13.8 % (ref 11.1–15.7)
WBC: 5.4 10*3/uL (ref 3.9–10.0)

## 2015-12-12 LAB — RETICULOCYTES
ABS Retic: 139.2 10*3/uL (ref 19.0–186.0)
RBC.: 4.8 MIL/uL (ref 3.87–5.11)
RETIC CT PCT: 2.9 % — AB (ref 0.4–2.3)

## 2015-12-13 ENCOUNTER — Other Ambulatory Visit: Payer: Self-pay | Admitting: *Deleted

## 2015-12-13 ENCOUNTER — Telehealth: Payer: Self-pay | Admitting: *Deleted

## 2015-12-13 DIAGNOSIS — D509 Iron deficiency anemia, unspecified: Secondary | ICD-10-CM

## 2015-12-13 LAB — IRON AND TIBC
%SAT: 19 % — AB (ref 21–57)
Iron: 47 ug/dL (ref 41–142)
TIBC: 248 ug/dL (ref 236–444)
UIBC: 200 ug/dL (ref 120–384)

## 2015-12-13 LAB — FERRITIN: FERRITIN: 260 ng/mL (ref 9–269)

## 2015-12-13 NOTE — Telephone Encounter (Addendum)
Patient aware of results. Appointment made.   ----- Message from Jamie Napoleon, MD sent at 12/13/2015 11:30 AM EST ----- Call - iron is dropping again.  Need 1 dose of Feraheme!!  Jamie Mcdowell

## 2015-12-14 ENCOUNTER — Ambulatory Visit (HOSPITAL_BASED_OUTPATIENT_CLINIC_OR_DEPARTMENT_OTHER): Payer: BLUE CROSS/BLUE SHIELD

## 2015-12-14 VITALS — BP 142/83 | HR 67 | Temp 98.2°F | Resp 18

## 2015-12-14 DIAGNOSIS — D509 Iron deficiency anemia, unspecified: Secondary | ICD-10-CM

## 2015-12-14 MED ORDER — SODIUM CHLORIDE 0.9 % IV SOLN
510.0000 mg | Freq: Once | INTRAVENOUS | Status: AC
  Administered 2015-12-14: 510 mg via INTRAVENOUS
  Filled 2015-12-14: qty 17

## 2015-12-14 MED ORDER — SODIUM CHLORIDE 0.9 % IV SOLN
INTRAVENOUS | Status: DC
  Administered 2015-12-14: 14:00:00 via INTRAVENOUS

## 2015-12-14 NOTE — Patient Instructions (Signed)

## 2016-01-26 ENCOUNTER — Other Ambulatory Visit (HOSPITAL_BASED_OUTPATIENT_CLINIC_OR_DEPARTMENT_OTHER): Payer: BLUE CROSS/BLUE SHIELD

## 2016-01-26 ENCOUNTER — Ambulatory Visit (HOSPITAL_BASED_OUTPATIENT_CLINIC_OR_DEPARTMENT_OTHER): Payer: BLUE CROSS/BLUE SHIELD | Admitting: Hematology & Oncology

## 2016-01-26 ENCOUNTER — Encounter: Payer: Self-pay | Admitting: Hematology & Oncology

## 2016-01-26 VITALS — BP 154/92 | HR 70 | Temp 97.9°F | Resp 16 | Ht 65.0 in | Wt 185.0 lb

## 2016-01-26 DIAGNOSIS — D509 Iron deficiency anemia, unspecified: Secondary | ICD-10-CM

## 2016-01-26 DIAGNOSIS — R5382 Chronic fatigue, unspecified: Secondary | ICD-10-CM

## 2016-01-26 DIAGNOSIS — E559 Vitamin D deficiency, unspecified: Secondary | ICD-10-CM

## 2016-01-26 DIAGNOSIS — R5383 Other fatigue: Secondary | ICD-10-CM | POA: Diagnosis not present

## 2016-01-26 DIAGNOSIS — Z8759 Personal history of other complications of pregnancy, childbirth and the puerperium: Secondary | ICD-10-CM

## 2016-01-26 LAB — CBC WITH DIFFERENTIAL (CANCER CENTER ONLY)
BASO#: 0 10*3/uL (ref 0.0–0.2)
BASO%: 0.3 % (ref 0.0–2.0)
EOS%: 1 % (ref 0.0–7.0)
Eosinophils Absolute: 0.1 10*3/uL (ref 0.0–0.5)
HCT: 42.2 % (ref 34.8–46.6)
HEMOGLOBIN: 13.6 g/dL (ref 11.6–15.9)
LYMPH#: 2 10*3/uL (ref 0.9–3.3)
LYMPH%: 34.3 % (ref 14.0–48.0)
MCH: 29.3 pg (ref 26.0–34.0)
MCHC: 32.2 g/dL (ref 32.0–36.0)
MCV: 91 fL (ref 81–101)
MONO#: 0.4 10*3/uL (ref 0.1–0.9)
MONO%: 5.9 % (ref 0.0–13.0)
NEUT%: 58.5 % (ref 39.6–80.0)
NEUTROS ABS: 3.5 10*3/uL (ref 1.5–6.5)
PLATELETS: 297 10*3/uL (ref 145–400)
RBC: 4.64 10*6/uL (ref 3.70–5.32)
RDW: 14 % (ref 11.1–15.7)
WBC: 6 10*3/uL (ref 3.9–10.0)

## 2016-01-26 NOTE — Progress Notes (Signed)
Hematology and Oncology Follow Up Visit  Jamie Mcdowell [id number] 10-11-1961 55 y.o. 01/26/2016   Principle Diagnosis:   Recurrent iron deficiency anemia  Hypertension  Current Therapy:    IV iron as indicated-last dose in December 2016     Interim History:  Jamie Mcdowell is in for followup. She is doing fairly well. We actually sawl one of her daughters. Her daughter was heterozygous for factor V Leiden mutation.I told Jamie Mcdowell that she probably inherited this from her. As such, we probably will be checking this being we see her back.  We gave her iron back in December. At that point time, her iron saturation was 19%.   She stilll feels quite tired. I'm not sure as to why area and I think it would be worth while checking a thyroid level on her. We'll send off a TSH.she has not had a vitamin D level checked. I think this also might be worthwhile checking.  She's had no bleeding. Her appetite is doing okay. She's had no nausea or vomiting. She's had no change in bowel or bladder habits..  Overall, her performance status is ECOG 0.  Medications:  Current outpatient prescriptions:  .  cloNIDine (CATAPRES) 0.2 MG tablet, Take 1 tablet (0.2 mg total) by mouth 2 (two) times daily., Disp: 60 tablet, Rfl: 1 .  Fexofenadine-Pseudoephedrine (ALLEGRA-D 24 HOUR PO), Take by mouth daily.  , Disp: , Rfl:  .  omeprazole (PRILOSEC) 10 MG capsule, Take 20 mg by mouth daily. , Disp: , Rfl:   Allergies:  Allergies  Allergen Reactions  . Ciprocinonide [Fluocinolone]   . Penicillins   . Sulfa Antibiotics     Past Medical History, Surgical history, Social history, and Family History were reviewed and updated.  Review of Systems: As above  Physical Exam:  height is 5\' 5"  (1.651 m) and weight is 185 lb (83.915 kg). Her oral temperature is 97.9 F (36.6 C). Her blood pressure is 154/92 and her pulse is 70. Her respiration is 16.  I checked her blood pressure twice. In the right arm, I got a blood  pressure of 220/130. In the left arm, her blood pressure was 230/140.  Head and neck exam shows no ocular or oral lesions. . There is no adenopathy in the neck. Lungs are clear. Cardiac exam regular rate and rhythm with no murmurs, rubs or bruits. Abdomen is soft. She has good bowel sounds. There is no fluid wave. There is no palpable liver or spleen tip. Back exam shows no tenderness over the spine ribs or hips. Extremities shows no clubbing cyanosis or edema. She has good range of motion of her joints. Skin exam no rashes ecchymoses or petechia. Neurological exam is nonfocal.  Lab Results  Component Value Date   WBC 6.0 01/26/2016   HGB 13.6 01/26/2016   HCT 42.2 01/26/2016   MCV 91 01/26/2016   PLT 297 01/26/2016     Chemistry      Component Value Date/Time   NA 147* 08/04/2015 1427   NA 141 09/02/2012 0859   K 5.5* 08/04/2015 1427   K 3.8 09/02/2012 0859   CL 111* 08/04/2015 1427   CL 105 09/02/2012 0859   CO2 26 08/04/2015 1427   CO2 26 09/02/2012 0859   BUN 15 08/04/2015 1427   BUN 13 09/02/2012 0859   CREATININE 0.77 08/04/2015 1427   CREATININE 0.6 09/02/2012 0859      Component Value Date/Time   CALCIUM 10.1 08/04/2015 1427  CALCIUM 9.2 09/02/2012 0859   ALKPHOS 90 05/02/2015 1105   ALKPHOS 100* 09/02/2012 0859   AST 11 05/02/2015 1105   AST 18 09/02/2012 0859   ALT 13 05/02/2015 1105   ALT 17 09/02/2012 0859   BILITOT 0.5 05/02/2015 1105   BILITOT 0.70 09/02/2012 0859         Impression and Plan: Jamie Mcdowell is a 55 year old white female with recurrent iron deficiency anemia.   Her hemoglobin is holding pretty steady right now. Hopefully, her iron studies all looking good.   I'm not sure as to why she is not feeling all that well. Again, we'll check her TSH.I don't know if some of the fatigue might be from her clonidine.  We will certainly check her for the Factor 5 Leiden mutation.  I would like to see her back in 6 weeks so that we can just follow up  with her not feeling well.  Volanda Napoleon, MD 2/2/20175:28 PM

## 2016-01-27 LAB — IRON AND TIBC
%SAT: 14 % — AB (ref 21–57)
Iron: 40 ug/dL — ABNORMAL LOW (ref 41–142)
TIBC: 278 ug/dL (ref 236–444)
UIBC: 237 ug/dL (ref 120–384)

## 2016-01-27 LAB — TSH: TSH: 0.942 m[IU]/L (ref 0.308–3.960)

## 2016-01-27 LAB — FERRITIN: Ferritin: 225 ng/ml (ref 9–269)

## 2016-02-02 ENCOUNTER — Ambulatory Visit (HOSPITAL_BASED_OUTPATIENT_CLINIC_OR_DEPARTMENT_OTHER): Payer: BLUE CROSS/BLUE SHIELD

## 2016-02-02 VITALS — BP 168/98 | HR 65 | Temp 98.2°F | Resp 18

## 2016-02-02 DIAGNOSIS — D509 Iron deficiency anemia, unspecified: Secondary | ICD-10-CM

## 2016-02-02 MED ORDER — SODIUM CHLORIDE 0.9 % IV SOLN
1450.0000 mg | Freq: Once | INTRAVENOUS | Status: AC
  Administered 2016-02-02: 1450 mg via INTRAVENOUS
  Filled 2016-02-02: qty 29

## 2016-02-02 MED ORDER — METHYLPREDNISOLONE SODIUM SUCC 125 MG IJ SOLR
125.0000 mg | Freq: Once | INTRAMUSCULAR | Status: AC
  Administered 2016-02-02: 125 mg via INTRAVENOUS

## 2016-02-02 MED ORDER — FAMOTIDINE IN NACL 20-0.9 MG/50ML-% IV SOLN
20.0000 mg | Freq: Once | INTRAVENOUS | Status: AC
  Administered 2016-02-02: 20 mg via INTRAVENOUS

## 2016-02-02 MED ORDER — SODIUM CHLORIDE 0.9 % IV SOLN
50.0000 mg | Freq: Once | INTRAVENOUS | Status: AC
  Administered 2016-02-02: 50 mg via INTRAVENOUS
  Filled 2016-02-02: qty 1

## 2016-02-02 MED ORDER — METHYLPREDNISOLONE SODIUM SUCC 125 MG IJ SOLR
INTRAMUSCULAR | Status: AC
  Filled 2016-02-02: qty 2

## 2016-02-02 MED ORDER — FAMOTIDINE IN NACL 20-0.9 MG/50ML-% IV SOLN
INTRAVENOUS | Status: AC
  Filled 2016-02-02: qty 50

## 2016-02-02 NOTE — Patient Instructions (Signed)

## 2016-02-06 ENCOUNTER — Ambulatory Visit: Payer: Self-pay

## 2016-02-14 ENCOUNTER — Telehealth: Payer: Self-pay | Admitting: *Deleted

## 2016-02-14 NOTE — Telephone Encounter (Signed)
Patient c/o continued fatigue and achyness. She received infed on 02/02/2016. Spoke to Dr Marin Olp and he states that it may take several weeks for her to feel the effects of the infed. Patient aware.

## 2016-03-08 ENCOUNTER — Ambulatory Visit: Payer: Self-pay | Admitting: Hematology & Oncology

## 2016-03-08 ENCOUNTER — Other Ambulatory Visit: Payer: Self-pay

## 2016-03-08 ENCOUNTER — Other Ambulatory Visit: Payer: Self-pay | Admitting: Family Medicine

## 2016-03-08 ENCOUNTER — Other Ambulatory Visit (HOSPITAL_COMMUNITY)
Admission: RE | Admit: 2016-03-08 | Discharge: 2016-03-08 | Disposition: A | Discharge status: Home / Self Care | Payer: BLUE CROSS/BLUE SHIELD | Source: Ambulatory Visit | Attending: Family Medicine | Admitting: Family Medicine

## 2016-03-08 DIAGNOSIS — Z01411 Encounter for gynecological examination (general) (routine) with abnormal findings: Secondary | ICD-10-CM | POA: Insufficient documentation

## 2016-03-09 ENCOUNTER — Other Ambulatory Visit (HOSPITAL_BASED_OUTPATIENT_CLINIC_OR_DEPARTMENT_OTHER): Payer: BLUE CROSS/BLUE SHIELD | Admitting: Lab

## 2016-03-09 ENCOUNTER — Ambulatory Visit (HOSPITAL_BASED_OUTPATIENT_CLINIC_OR_DEPARTMENT_OTHER): Payer: BLUE CROSS/BLUE SHIELD

## 2016-03-09 ENCOUNTER — Ambulatory Visit (HOSPITAL_BASED_OUTPATIENT_CLINIC_OR_DEPARTMENT_OTHER): Payer: BLUE CROSS/BLUE SHIELD | Admitting: Family

## 2016-03-09 ENCOUNTER — Encounter: Payer: Self-pay | Admitting: Family

## 2016-03-09 VITALS — BP 160/97 | HR 62 | Temp 98.5°F | Resp 16 | Ht 65.0 in | Wt 188.0 lb

## 2016-03-09 DIAGNOSIS — D509 Iron deficiency anemia, unspecified: Secondary | ICD-10-CM

## 2016-03-09 DIAGNOSIS — R5382 Chronic fatigue, unspecified: Secondary | ICD-10-CM

## 2016-03-09 DIAGNOSIS — Z8759 Personal history of other complications of pregnancy, childbirth and the puerperium: Secondary | ICD-10-CM

## 2016-03-09 DIAGNOSIS — E559 Vitamin D deficiency, unspecified: Secondary | ICD-10-CM

## 2016-03-09 LAB — COMPREHENSIVE METABOLIC PANEL (CC13)
ALBUMIN: 4.3 g/dL (ref 3.5–5.5)
ALK PHOS: 89 IU/L (ref 39–117)
ALT: 13 IU/L (ref 0–32)
AST (SGOT): 15 IU/L (ref 0–40)
Albumin/Globulin Ratio: 1.7 (ref 1.2–2.2)
BILIRUBIN TOTAL: 0.2 mg/dL (ref 0.0–1.2)
BUN / CREAT RATIO: 30 — AB (ref 9–23)
BUN: 18 mg/dL (ref 6–24)
CHLORIDE: 103 mmol/L (ref 96–106)
CREATININE: 0.6 mg/dL (ref 0.57–1.00)
Calcium, Ser: 9.6 mg/dL (ref 8.7–10.2)
Carbon Dioxide, Total: 25 mmol/L (ref 18–29)
GFR calc non Af Amer: 105 mL/min/{1.73_m2} (ref >59)
GFR, EST AFRICAN AMERICAN: 121 mL/min/{1.73_m2} (ref >59)
GLOBULIN, TOTAL: 2.6 g/dL (ref 1.5–4.5)
Glucose: 96 mg/dL (ref 65–99)
Potassium, Ser: 3.9 mmol/L (ref 3.5–5.2)
SODIUM: 139 mmol/L (ref 134–144)
Total Protein: 6.9 g/dL (ref 6.0–8.5)

## 2016-03-09 LAB — CBC WITH DIFFERENTIAL (CANCER CENTER ONLY)
BASO#: 0 10*3/uL (ref 0.0–0.2)
BASO%: 0.6 % (ref 0.0–2.0)
EOS%: 1.6 % (ref 0.0–7.0)
Eosinophils Absolute: 0.1 10*3/uL (ref 0.0–0.5)
HEMATOCRIT: 41.2 % (ref 34.8–46.6)
HEMOGLOBIN: 13.4 g/dL (ref 11.6–15.9)
LYMPH#: 2.2 10*3/uL (ref 0.9–3.3)
LYMPH%: 43.4 % (ref 14.0–48.0)
MCH: 29.1 pg (ref 26.0–34.0)
MCHC: 32.5 g/dL (ref 32.0–36.0)
MCV: 90 fL (ref 81–101)
MONO#: 0.4 10*3/uL (ref 0.1–0.9)
MONO%: 8.5 % (ref 0.0–13.0)
NEUT%: 45.9 % (ref 39.6–80.0)
NEUTROS ABS: 2.3 10*3/uL (ref 1.5–6.5)
Platelets: 273 10*3/uL (ref 145–400)
RBC: 4.6 10*6/uL (ref 3.70–5.32)
RDW: 13.6 % (ref 11.1–15.7)
WBC: 5.1 10*3/uL (ref 3.9–10.0)

## 2016-03-09 NOTE — Progress Notes (Signed)
Hematology and Oncology Follow Up Visit  Jamie Mcdowell [id number] April 20, 1961 55 y.o. 03/12/2016   Principle Diagnosis:  Recurrent iron deficiency anemia  Current Therapy:   IV iron as indicated     Interim History:  Jamie Mcdowell is here today for a follow-up. She is feeling quite fatigued and having SOB with exertion at times. She received INFED in February but was unable to tell much of a difference in how she felt. Her iron saturation at that time was 14%. She has high BP and recently had a change in her medication she will start taking Aldactone as well as Catapres.  Her vitamin D was found to be low at a recent visit with her PCP and she is now taking a supplement daily.  She states that she has had several dark stools. She has not noticed and bright red blood. She states that her last colonoscopy was in 2014 with Dr. Paulita Mcdowell and that she is now on Prilosec daily.  No fever, chills, n/v, cough, rash, dizziness, chest pain, palpitations, abdominal pain or changes in bowel or bladder habits.  She has had no swelling, tenderness, numbness or tingling.  She has maintained a good appetite and is staying well hydrated. Her weight is stable.   Medications:    Medication List       This list is accurate as of: 03/09/16 11:59 PM.  Always use your most recent med list.               ALLEGRA-D 24 HOUR PO  Take by mouth daily.     cloNIDine 0.2 MG tablet  Commonly known as:  CATAPRES  Take 1 tablet (0.2 mg total) by mouth 2 (two) times daily.     omeprazole 10 MG capsule  Commonly known as:  PRILOSEC  Take 20 mg by mouth daily.     spironolactone 25 MG tablet  Commonly known as:  ALDACTONE        Allergies:  Allergies  Allergen Reactions  . Ciprocinonide [Fluocinolone]   . Penicillins   . Sulfa Antibiotics     Past Medical History, Surgical history, Social history, and Family History were reviewed and updated.  Review of Systems: All other 10 point review of systems is  negative.   Physical Exam:  height is 5\' 5"  (1.651 m) and weight is 188 lb (85.276 kg). Her oral temperature is 98.5 F (36.9 C). Her blood pressure is 160/97 and her pulse is 62. Her respiration is 16.   Wt Readings from Last 3 Encounters:  03/09/16 188 lb (85.276 kg)  01/26/16 185 lb (83.915 kg)  10/27/15 184 lb (83.462 kg)    Ocular: Sclerae unicteric, pupils equal, round and reactive to light Ear-nose-throat: Oropharynx clear, dentition fair Lymphatic: No cervical supraclavicular or axillary adenopathy Lungs no rales or rhonchi, good excursion bilaterally Heart regular rate and rhythm, no murmur appreciated Abd soft, nontender, positive bowel sounds, no liver or spleen tip palpated on exam, no fluid wave  MSK no focal spinal tenderness, no joint edema Neuro: non-focal, well-oriented, appropriate affect Breasts: Deferred  Lab Results  Component Value Date   WBC 5.1 03/09/2016   HGB 13.4 03/09/2016   HCT 41.2 03/09/2016   MCV 90 03/09/2016   PLT 273 03/09/2016   Lab Results  Component Value Date   FERRITIN 225 01/26/2016   IRON 40* 01/26/2016   TIBC 278 01/26/2016   UIBC 237 01/26/2016   IRONPCTSAT 14* 01/26/2016   Lab Results  Component  Value Date   RETICCTPCT 2.9* 12/12/2015   RBC 4.60 03/09/2016   RETICCTABS 139.2 12/12/2015   No results found for: KPAFRELGTCHN, LAMBDASER, KAPLAMBRATIO No results found for: Kandis Cocking, IGMSERUM Lab Results  Component Value Date   TOTALPROTELP 6.8 02/28/2012   ALBUMINELP 57.8 02/28/2012   A1GS 4.0 02/28/2012   A2GS 11.7 02/28/2012   BETS 6.7 02/28/2012   BETA2SER 5.2 02/28/2012   GAMS 14.6 02/28/2012   MSPIKE NOT DET 02/28/2012   SPEI * 02/28/2012     Chemistry      Component Value Date/Time   NA 139 03/09/2016 1507   NA 147* 08/04/2015 1427   NA 141 09/02/2012 0859   K 3.9 03/09/2016 1507   K 5.5* 08/04/2015 1427   K 3.8 09/02/2012 0859   CL 103 03/09/2016 1507   CL 111* 08/04/2015 1427   CL 105 09/02/2012  0859   CO2 25 03/09/2016 1507   CO2 26 08/04/2015 1427   CO2 26 09/02/2012 0859   BUN 18 03/09/2016 1507   BUN 15 08/04/2015 1427   BUN 13 09/02/2012 0859   CREATININE 0.60 03/09/2016 1507   CREATININE 0.77 08/04/2015 1427   CREATININE 0.6 09/02/2012 0859      Component Value Date/Time   CALCIUM 9.6 03/09/2016 1507   CALCIUM 10.1 08/04/2015 1427   CALCIUM 9.2 09/02/2012 0859   ALKPHOS 89 03/09/2016 1507   ALKPHOS 90 05/02/2015 1105   ALKPHOS 100* 09/02/2012 0859   AST 15 03/09/2016 1507   AST 11 05/02/2015 1105   AST 18 09/02/2012 0859   ALT 13 03/09/2016 1507   ALT 13 05/02/2015 1105   ALT 17 09/02/2012 0859   BILITOT 0.2 03/09/2016 1507   BILITOT 0.5 05/02/2015 1105   BILITOT 0.70 09/02/2012 0859     Impression and Plan: Jamie Mcdowell is a 55 yo white female with recurrent iron deficiency anemia. She did not feel much better after receiving INFED in February. She is still quite fatigued and has mild SOB with exertion at times. We will see what her iron studies show and bring her in next week for an infusion if needed.  She has noticed a few dark stool. She will take home stool cards today and bring them back in on Monday to assess for blood.  Her Hgb is stable at 13.4 and an MCV of 90. Her vitamin B 12 was normal and factor V is pending.   We will go ahead and plan to see her back in 6 weeks for lab work and follow-up.  She will contact us with any questions or concerns. We can certainly see her sooner if need be.   Jamie Bottom, NP 3/20/201710:12 AM

## 2016-03-10 LAB — VITAMIN B12: VITAMIN B 12: 338 pg/mL (ref 211–946)

## 2016-03-12 LAB — CYTOLOGY - PAP

## 2016-03-13 ENCOUNTER — Encounter: Payer: Self-pay | Admitting: *Deleted

## 2016-03-13 LAB — FERRITIN CHCC: Ferritin: 585 ng/mL — ABNORMAL HIGH (ref 15–150)

## 2016-03-13 LAB — IRON AND TIBC CHCC
IRON SATURATION: 20 % (ref 15–55)
IRON: 50 ug/dL (ref 27–159)
TIBC: 245 ug/dL — AB (ref 250–450)
UIBC: 195 ug/dL (ref 131–425)

## 2016-03-14 LAB — FACTOR 5 LEIDEN

## 2016-03-15 ENCOUNTER — Encounter: Payer: Self-pay | Admitting: Nurse Practitioner

## 2016-03-23 ENCOUNTER — Other Ambulatory Visit: Payer: Self-pay | Admitting: Lab

## 2016-03-23 ENCOUNTER — Other Ambulatory Visit: Payer: Self-pay | Admitting: Family

## 2016-03-23 DIAGNOSIS — D509 Iron deficiency anemia, unspecified: Secondary | ICD-10-CM

## 2016-03-23 DIAGNOSIS — R195 Other fecal abnormalities: Secondary | ICD-10-CM

## 2016-03-23 LAB — FECAL OCCULT BLOOD, GUIAC - CHCC SATELLITE: OCCULT BLOOD: POSITIVE

## 2016-03-23 NOTE — Progress Notes (Signed)
Stool positive for blood. Referral placed for Dr. Paulita Fujita. Message left on patient's answering machine.

## 2016-04-20 ENCOUNTER — Other Ambulatory Visit (HOSPITAL_BASED_OUTPATIENT_CLINIC_OR_DEPARTMENT_OTHER): Payer: BLUE CROSS/BLUE SHIELD

## 2016-04-20 ENCOUNTER — Encounter: Payer: Self-pay | Admitting: Family

## 2016-04-20 ENCOUNTER — Ambulatory Visit (HOSPITAL_BASED_OUTPATIENT_CLINIC_OR_DEPARTMENT_OTHER): Payer: BLUE CROSS/BLUE SHIELD | Admitting: Family

## 2016-04-20 ENCOUNTER — Ambulatory Visit (HOSPITAL_BASED_OUTPATIENT_CLINIC_OR_DEPARTMENT_OTHER): Payer: BLUE CROSS/BLUE SHIELD

## 2016-04-20 VITALS — BP 153/87 | HR 69 | Temp 97.8°F | Resp 16 | Ht 65.0 in | Wt 186.0 lb

## 2016-04-20 DIAGNOSIS — D509 Iron deficiency anemia, unspecified: Secondary | ICD-10-CM

## 2016-04-20 LAB — COMPREHENSIVE METABOLIC PANEL
ALBUMIN: 3.8 g/dL (ref 3.5–5.0)
ALK PHOS: 85 U/L (ref 40–150)
ALT: 13 U/L (ref 0–55)
AST: 13 U/L (ref 5–34)
Anion Gap: 8 mEq/L (ref 3–11)
BUN: 16.4 mg/dL (ref 7.0–26.0)
CO2: 22 mEq/L (ref 22–29)
Calcium: 9.5 mg/dL (ref 8.4–10.4)
Chloride: 112 mEq/L — ABNORMAL HIGH (ref 98–109)
Creatinine: 0.7 mg/dL (ref 0.6–1.1)
GLUCOSE: 94 mg/dL (ref 70–140)
Potassium: 3.9 mEq/L (ref 3.5–5.1)
SODIUM: 143 meq/L (ref 136–145)
TOTAL PROTEIN: 6.8 g/dL (ref 6.4–8.3)

## 2016-04-20 LAB — CBC WITH DIFFERENTIAL (CANCER CENTER ONLY)
BASO#: 0 10*3/uL (ref 0.0–0.2)
BASO%: 0.4 % (ref 0.0–2.0)
EOS%: 1.6 % (ref 0.0–7.0)
Eosinophils Absolute: 0.1 10*3/uL (ref 0.0–0.5)
HEMATOCRIT: 35.5 % (ref 34.8–46.6)
HGB: 11.5 g/dL — ABNORMAL LOW (ref 11.6–15.9)
LYMPH#: 1.6 10*3/uL (ref 0.9–3.3)
LYMPH%: 28.2 % (ref 14.0–48.0)
MCH: 28.5 pg (ref 26.0–34.0)
MCHC: 32.4 g/dL (ref 32.0–36.0)
MCV: 88 fL (ref 81–101)
MONO#: 0.4 10*3/uL (ref 0.1–0.9)
MONO%: 6.7 % (ref 0.0–13.0)
NEUT#: 3.5 10*3/uL (ref 1.5–6.5)
NEUT%: 63.1 % (ref 39.6–80.0)
PLATELETS: 321 10*3/uL (ref 145–400)
RBC: 4.03 10*6/uL (ref 3.70–5.32)
RDW: 14.2 % (ref 11.1–15.7)
WBC: 5.5 10*3/uL (ref 3.9–10.0)

## 2016-04-20 MED ORDER — SODIUM CHLORIDE 0.9 % IV SOLN
510.0000 mg | Freq: Once | INTRAVENOUS | Status: AC
  Administered 2016-04-20: 510 mg via INTRAVENOUS
  Filled 2016-04-20: qty 17

## 2016-04-20 MED ORDER — SODIUM CHLORIDE 0.9 % IV SOLN
Freq: Once | INTRAVENOUS | Status: AC
  Administered 2016-04-20: 15:00:00 via INTRAVENOUS

## 2016-04-20 NOTE — Patient Instructions (Signed)

## 2016-04-20 NOTE — Progress Notes (Signed)
Hematology and Oncology Follow Up Visit  Jamie Mcdowell [id number] 03-12-1961 55 y.o. 04/20/2016   Principle Diagnosis:  Recurrent iron deficiency anemia  Current Therapy:   IV iron as indicated     Interim History:  Jamie Mcdowell is here today for a follow-up. She is feeling a little better but still having episodes of fatigue. Her Hgb is down at 11.5 with an MCV of 88. She had an endoscopy and colonoscopy with Dr. Paulita Fujita in earlier this month which showed she had a large hiatal hernia and an external hemorrhoid.  She still notices dark tarry stools a couple times a month. She plans to follow up with GI regarding a small bowel follow through. She is currently on Prilosec daily.  Her BP is also much improved since starting clonidine and aldactone.  No fever, chills, n/v, cough, rash, dizziness, SOB, chest pain, palpitations, abdominal pain or changes in bowel or bladder habits.  She has had no swelling, tenderness, numbness or tingling. No c/o joint aches pains.  She has maintained a good appetite and is staying well hydrated. Her weight is stable.   Medications:    Medication List       This list is accurate as of: 04/20/16  2:11 PM.  Always use your most recent med list.               ALLEGRA-D 24 HOUR PO  Take by mouth daily.     cloNIDine 0.2 MG tablet  Commonly known as:  CATAPRES  Take 1 tablet (0.2 mg total) by mouth 2 (two) times daily.     omeprazole 10 MG capsule  Commonly known as:  PRILOSEC  Take 20 mg by mouth daily.     spironolactone 25 MG tablet  Commonly known as:  ALDACTONE  Reported on 04/20/2016     Vitamin D (Ergocalciferol) 50000 units Caps capsule  Commonly known as:  DRISDOL        Allergies:  Allergies  Allergen Reactions  . Ciprocinonide [Fluocinolone]   . Penicillins   . Sulfa Antibiotics     Past Medical History, Surgical history, Social history, and Family History were reviewed and updated.  Review of Systems: All other 10 point  review of systems is negative.   Physical Exam:  height is 5\' 5"  (1.651 m) and weight is 186 lb (84.369 kg). Her oral temperature is 97.8 F (36.6 C). Her blood pressure is 153/87 and her pulse is 69. Her respiration is 16.   Wt Readings from Last 3 Encounters:  04/20/16 186 lb (84.369 kg)  03/09/16 188 lb (85.276 kg)  01/26/16 185 lb (83.915 kg)    Ocular: Sclerae unicteric, pupils equal, round and reactive to light Ear-nose-throat: Oropharynx clear, dentition fair Lymphatic: No cervical supraclavicular or axillary adenopathy Lungs no rales or rhonchi, good excursion bilaterally Heart regular rate and rhythm, no murmur appreciated Abd soft, nontender, positive bowel sounds, no liver or spleen tip palpated on exam, no fluid wave  MSK no focal spinal tenderness, no joint edema Neuro: non-focal, well-oriented, appropriate affect Breasts: Deferred  Lab Results  Component Value Date   WBC 5.5 04/20/2016   HGB 11.5* 04/20/2016   HCT 35.5 04/20/2016   MCV 88 04/20/2016   PLT 321 04/20/2016   Lab Results  Component Value Date   FERRITIN 585* 03/09/2016   IRON 50 03/09/2016   TIBC 245* 03/09/2016   UIBC 195 03/09/2016   IRONPCTSAT 20 03/09/2016   Lab Results  Component Value Date  RETICCTPCT 2.9* 12/12/2015   RBC 4.03 04/20/2016   RETICCTABS 139.2 12/12/2015   No results found for: KPAFRELGTCHN, LAMBDASER, KAPLAMBRATIO No results found for: Kandis Cocking, IGMSERUM Lab Results  Component Value Date   TOTALPROTELP 6.8 02/28/2012   ALBUMINELP 57.8 02/28/2012   A1GS 4.0 02/28/2012   A2GS 11.7 02/28/2012   BETS 6.7 02/28/2012   BETA2SER 5.2 02/28/2012   GAMS 14.6 02/28/2012   MSPIKE NOT DET 02/28/2012   SPEI * 02/28/2012     Chemistry      Component Value Date/Time   NA 139 03/09/2016 1507   NA 147* 08/04/2015 1427   NA 141 09/02/2012 0859   K 3.9 03/09/2016 1507   K 5.5* 08/04/2015 1427   K 3.8 09/02/2012 0859   CL 103 03/09/2016 1507   CL 111* 08/04/2015 1427    CL 105 09/02/2012 0859   CO2 25 03/09/2016 1507   CO2 26 08/04/2015 1427   CO2 26 09/02/2012 0859   BUN 18 03/09/2016 1507   BUN 15 08/04/2015 1427   BUN 13 09/02/2012 0859   CREATININE 0.60 03/09/2016 1507   CREATININE 0.77 08/04/2015 1427   CREATININE 0.6 09/02/2012 0859      Component Value Date/Time   CALCIUM 9.6 03/09/2016 1507   CALCIUM 10.1 08/04/2015 1427   CALCIUM 9.2 09/02/2012 0859   ALKPHOS 89 03/09/2016 1507   ALKPHOS 90 05/02/2015 1105   ALKPHOS 100* 09/02/2012 0859   AST 15 03/09/2016 1507   AST 11 05/02/2015 1105   AST 18 09/02/2012 0859   ALT 13 03/09/2016 1507   ALT 13 05/02/2015 1105   ALT 17 09/02/2012 0859   BILITOT 0.2 03/09/2016 1507   BILITOT 0.5 05/02/2015 1105   BILITOT 0.70 09/02/2012 0859     Impression and Plan: Jamie Mcdowell is a 55 yo white female with recurrent iron deficiency anemia. She is symptomatic at this time with fatigue. She received INFED in February and could not tell much difference.  Her Hgb is down from 13.4 to 11.5 with an MCV of 88. We will go ahead and give her a dose of Feraheme while she is here today and possibly a second dose in 8 days if needed. We will see what her iron studies show.  We will go ahead and plan to see her back in 6 weeks for lab work and follow-up.  She will contact us with any questions or concerns. We can certainly see her sooner if need be.   [name], NP 4/28/20172:11 PM

## 2016-04-23 LAB — IRON AND TIBC
%SAT: 15 % — ABNORMAL LOW (ref 21–57)
Iron: 37 ug/dL — ABNORMAL LOW (ref 41–142)
TIBC: 246 ug/dL (ref 236–444)
UIBC: 209 ug/dL (ref 120–384)

## 2016-04-23 LAB — FERRITIN: Ferritin: 205 ng/ml (ref 9–269)

## 2016-04-24 ENCOUNTER — Telehealth: Payer: Self-pay | Admitting: *Deleted

## 2016-04-24 NOTE — Telephone Encounter (Addendum)
  Patient aware of results. Apt made for this Friday  ----- Message from [name], NP sent at 04/23/2016 11:20 AM EDT ----- Regarding: Iron Iron saturation was low. She will need a second dose of Feraheme this week please. Thank you!  Sarah  ----- Message -----    From: [facility]    Sent: 04/20/2016   1:56 PM      To: [name], NP

## 2016-04-27 ENCOUNTER — Ambulatory Visit: Payer: Self-pay

## 2016-05-02 ENCOUNTER — Ambulatory Visit (HOSPITAL_BASED_OUTPATIENT_CLINIC_OR_DEPARTMENT_OTHER): Payer: BLUE CROSS/BLUE SHIELD

## 2016-05-02 VITALS — BP 154/97 | HR 63 | Temp 97.7°F | Resp 20

## 2016-05-02 DIAGNOSIS — D509 Iron deficiency anemia, unspecified: Secondary | ICD-10-CM | POA: Diagnosis not present

## 2016-05-02 MED ORDER — SODIUM CHLORIDE 0.9 % IV SOLN
Freq: Once | INTRAVENOUS | Status: AC
  Administered 2016-05-02: 14:00:00 via INTRAVENOUS

## 2016-05-02 MED ORDER — FERUMOXYTOL INJECTION 510 MG/17 ML
510.0000 mg | Freq: Once | INTRAVENOUS | Status: AC
  Administered 2016-05-02: 510 mg via INTRAVENOUS
  Filled 2016-05-02: qty 17

## 2016-05-02 NOTE — Patient Instructions (Signed)

## 2016-05-29 ENCOUNTER — Encounter: Payer: Self-pay | Admitting: *Deleted

## 2016-06-01 ENCOUNTER — Encounter: Payer: Self-pay | Admitting: Family

## 2016-06-01 ENCOUNTER — Other Ambulatory Visit (HOSPITAL_BASED_OUTPATIENT_CLINIC_OR_DEPARTMENT_OTHER): Payer: BLUE CROSS/BLUE SHIELD

## 2016-06-01 ENCOUNTER — Ambulatory Visit (HOSPITAL_BASED_OUTPATIENT_CLINIC_OR_DEPARTMENT_OTHER): Payer: BLUE CROSS/BLUE SHIELD | Admitting: Family

## 2016-06-01 VITALS — BP 142/90 | HR 79 | Temp 98.3°F | Resp 20

## 2016-06-01 DIAGNOSIS — D509 Iron deficiency anemia, unspecified: Secondary | ICD-10-CM

## 2016-06-01 LAB — CBC WITH DIFFERENTIAL (CANCER CENTER ONLY)
BASO#: 0 10*3/uL (ref 0.0–0.2)
BASO%: 0.2 % (ref 0.0–2.0)
EOS%: 1.5 % (ref 0.0–7.0)
Eosinophils Absolute: 0.1 10*3/uL (ref 0.0–0.5)
HCT: 40 % (ref 34.8–46.6)
HGB: 13.2 g/dL (ref 11.6–15.9)
LYMPH#: 1.7 10*3/uL (ref 0.9–3.3)
LYMPH%: 35.8 % (ref 14.0–48.0)
MCH: 29 pg (ref 26.0–34.0)
MCHC: 33 g/dL (ref 32.0–36.0)
MCV: 88 fL (ref 81–101)
MONO#: 0.4 10*3/uL (ref 0.1–0.9)
MONO%: 7.8 % (ref 0.0–13.0)
NEUT#: 2.5 10*3/uL (ref 1.5–6.5)
NEUT%: 54.7 % (ref 39.6–80.0)
PLATELETS: 291 10*3/uL (ref 145–400)
RBC: 4.55 10*6/uL (ref 3.70–5.32)
RDW: 14.9 % (ref 11.1–15.7)
WBC: 4.6 10*3/uL (ref 3.9–10.0)

## 2016-06-01 LAB — COMPREHENSIVE METABOLIC PANEL
ALT: 16 U/L (ref 0–55)
ANION GAP: 7 meq/L (ref 3–11)
AST: 13 U/L (ref 5–34)
Albumin: 3.9 g/dL (ref 3.5–5.0)
Alkaline Phosphatase: 86 U/L (ref 40–150)
BUN: 11.6 mg/dL (ref 7.0–26.0)
CALCIUM: 9.6 mg/dL (ref 8.4–10.4)
CHLORIDE: 109 meq/L (ref 98–109)
CO2: 24 meq/L (ref 22–29)
CREATININE: 0.8 mg/dL (ref 0.6–1.1)
EGFR: 87 mL/min/{1.73_m2} — ABNORMAL LOW (ref >90)
Glucose: 91 mg/dl (ref 70–140)
POTASSIUM: 4 meq/L (ref 3.5–5.1)
Sodium: 141 mEq/L (ref 136–145)
Total Bilirubin: 0.34 mg/dL (ref 0.20–1.20)
Total Protein: 7.1 g/dL (ref 6.4–8.3)

## 2016-06-01 NOTE — Progress Notes (Signed)
Hematology and Oncology Follow Up Visit  Jamie Mcdowell [id number] 1961/06/05 55 y.o. 06/01/2016   Principle Diagnosis:  Recurrent iron deficiency anemia  Current Therapy:   IV iron as indicated     Interim History:  Jamie Mcdowell is here today for a follow-up. She is having some fatigue and SOB when going up stairs. This resolves with taking a moment to rest. She has had a few episodes of dizziness. No syncopal episodes or falls.  She is chewing ice.  She has also had some palpitations. No chest pain. Her BP is still elevated at 142/90 with a HR of 79. She follows up with her PCP next week to discuss changes to her antihypertensive medication.  No fever, chills, n/v, cough, rash, chest pain, abdominal pain or changes in bowel or bladder habits.  She has had no swelling, tenderness, numbness or tingling. No c/o joint aches pains.  She has a good appetite and is staying well hydrated. Her weight is stable.   Medications:    Medication List       This list is accurate as of: 06/01/16  2:26 PM.  Always use your most recent med list.               ALLEGRA-D 24 HOUR PO  Take by mouth daily.     cloNIDine 0.2 MG tablet  Commonly known as:  CATAPRES  Take 1 tablet (0.2 mg total) by mouth 2 (two) times daily.     omeprazole 10 MG capsule  Commonly known as:  PRILOSEC  Take 20 mg by mouth daily.     spironolactone 25 MG tablet  Commonly known as:  ALDACTONE  Reported on 04/20/2016        Allergies:  Allergies  Allergen Reactions  . Ciprocinonide [Fluocinolone]   . Penicillins   . Sulfa Antibiotics     Past Medical History, Surgical history, Social history, and Family History were reviewed and updated.  Review of Systems: All other 10 point review of systems is negative.   Physical Exam:  oral temperature is 98.3 F (36.8 C). Her blood pressure is 142/90 and her pulse is 79. Her respiration is 20.   Wt Readings from Last 3 Encounters:  04/20/16 186 lb (84.369 kg)    03/09/16 188 lb (85.276 kg)  01/26/16 185 lb (83.915 kg)    Ocular: Sclerae unicteric, pupils equal, round and reactive to light Ear-nose-throat: Oropharynx clear, dentition fair Lymphatic: No cervical supraclavicular or axillary adenopathy Lungs no rales or rhonchi, good excursion bilaterally Heart regular rate and rhythm, no murmur appreciated Abd soft, nontender, positive bowel sounds, no liver or spleen tip palpated on exam, no fluid wave  MSK no focal spinal tenderness, no joint edema Neuro: non-focal, well-oriented, appropriate affect Breasts: Deferred  Lab Results  Component Value Date   WBC 4.6 06/01/2016   HGB 13.2 06/01/2016   HCT 40.0 06/01/2016   MCV 88 06/01/2016   PLT 291 06/01/2016   Lab Results  Component Value Date   FERRITIN 205 04/20/2016   IRON 37* 04/20/2016   TIBC 246 04/20/2016   UIBC 209 04/20/2016   IRONPCTSAT 15* 04/20/2016   Lab Results  Component Value Date   RETICCTPCT 2.9* 12/12/2015   RBC 4.55 06/01/2016   RETICCTABS 139.2 12/12/2015   No results found for: KPAFRELGTCHN, LAMBDASER, KAPLAMBRATIO No results found for: Kandis Cocking, IGMSERUM Lab Results  Component Value Date   TOTALPROTELP 6.8 02/28/2012   ALBUMINELP 57.8 02/28/2012   A1GS 4.0  02/28/2012   A2GS 11.7 02/28/2012   BETS 6.7 02/28/2012   BETA2SER 5.2 02/28/2012   GAMS 14.6 02/28/2012   MSPIKE NOT DET 02/28/2012   SPEI * 02/28/2012     Chemistry      Component Value Date/Time   NA 143 04/20/2016 1343   NA 139 03/09/2016 1507   NA 147* 08/04/2015 1427   NA 141 09/02/2012 0859   K 3.9 04/20/2016 1343   K 3.9 03/09/2016 1507   K 5.5* 08/04/2015 1427   K 3.8 09/02/2012 0859   CL 103 03/09/2016 1507   CL 111* 08/04/2015 1427   CL 105 09/02/2012 0859   CO2 22 04/20/2016 1343   CO2 25 03/09/2016 1507   CO2 26 08/04/2015 1427   CO2 26 09/02/2012 0859   BUN 16.4 04/20/2016 1343   BUN 18 03/09/2016 1507   BUN 15 08/04/2015 1427   BUN 13 09/02/2012 0859    CREATININE 0.7 04/20/2016 1343   CREATININE 0.60 03/09/2016 1507   CREATININE 0.77 08/04/2015 1427   CREATININE 0.6 09/02/2012 0859      Component Value Date/Time   CALCIUM 9.5 04/20/2016 1343   CALCIUM 9.6 03/09/2016 1507   CALCIUM 10.1 08/04/2015 1427   CALCIUM 9.2 09/02/2012 0859   ALKPHOS 85 04/20/2016 1343   ALKPHOS 89 03/09/2016 1507   ALKPHOS 90 05/02/2015 1105   ALKPHOS 100* 09/02/2012 0859   AST 13 04/20/2016 1343   AST 15 03/09/2016 1507   AST 11 05/02/2015 1105   AST 18 09/02/2012 0859   ALT 13 04/20/2016 1343   ALT 13 03/09/2016 1507   ALT 13 05/02/2015 1105   ALT 17 09/02/2012 0859   BILITOT <0.30 04/20/2016 1343   BILITOT 0.2 03/09/2016 1507   BILITOT 0.5 05/02/2015 1105   BILITOT 0.70 09/02/2012 0859     Impression and Plan: Jamie Mcdowell is a 55 yo white female white female with recurrent iron deficiency anemia. She is symptomatic at this time with fatigue, SOB with exertion, palpitations and chewing ice.   Her Hgb is stable at 13.2 with an MCV of 88. We will see what her iron studies show and bring her in next week for an infusion if needed.  We will go ahead and plan to see her back in 6 weeks for lab work and follow-up.  She will contact us with any questions or concerns. We can certainly see her sooner if need be.   [name], NP 6/9/20172:26 PM

## 2016-06-04 ENCOUNTER — Telehealth: Payer: Self-pay | Admitting: *Deleted

## 2016-06-04 ENCOUNTER — Other Ambulatory Visit: Payer: Self-pay | Admitting: *Deleted

## 2016-06-04 DIAGNOSIS — D509 Iron deficiency anemia, unspecified: Secondary | ICD-10-CM

## 2016-06-04 LAB — IRON AND TIBC
%SAT: 18 % — ABNORMAL LOW (ref 21–57)
Iron: 44 ug/dL (ref 41–142)
TIBC: 243 ug/dL (ref 236–444)
UIBC: 198 ug/dL (ref 120–384)

## 2016-06-04 LAB — FERRITIN: Ferritin: 434 ng/ml — ABNORMAL HIGH (ref 9–269)

## 2016-06-04 NOTE — Telephone Encounter (Addendum)
Patient aware of results. Appointment made.  ----- Message from [name], NP sent at 06/04/2016 12:25 PM EDT ----- Regarding: Iron Iron saturation was low. She will need 1 dose of Feraheme scheduled for this week please. Thank you!  Sarah   ----- Message -----    From: [facility] Interface    Sent: 06/01/2016   1:47 PM      To: [name], NP

## 2016-06-05 ENCOUNTER — Ambulatory Visit: Payer: Self-pay | Admitting: Family

## 2016-06-05 ENCOUNTER — Ambulatory Visit: Payer: Self-pay

## 2016-06-05 ENCOUNTER — Other Ambulatory Visit: Payer: Self-pay

## 2016-06-07 ENCOUNTER — Ambulatory Visit (HOSPITAL_BASED_OUTPATIENT_CLINIC_OR_DEPARTMENT_OTHER): Payer: BLUE CROSS/BLUE SHIELD

## 2016-06-07 VITALS — BP 124/96 | HR 68 | Temp 98.2°F | Resp 18

## 2016-06-07 DIAGNOSIS — D509 Iron deficiency anemia, unspecified: Secondary | ICD-10-CM

## 2016-06-07 MED ORDER — SODIUM CHLORIDE 0.9 % IV SOLN
510.0000 mg | Freq: Once | INTRAVENOUS | Status: AC
  Administered 2016-06-07: 510 mg via INTRAVENOUS
  Filled 2016-06-07: qty 17

## 2016-06-07 MED ORDER — SODIUM CHLORIDE 0.9 % IV SOLN
Freq: Once | INTRAVENOUS | Status: AC
  Administered 2016-06-07: 13:00:00 via INTRAVENOUS

## 2016-06-07 NOTE — Patient Instructions (Signed)

## 2016-06-20 ENCOUNTER — Emergency Department (HOSPITAL_BASED_OUTPATIENT_CLINIC_OR_DEPARTMENT_OTHER): Payer: BLUE CROSS/BLUE SHIELD

## 2016-06-20 ENCOUNTER — Encounter (HOSPITAL_BASED_OUTPATIENT_CLINIC_OR_DEPARTMENT_OTHER): Payer: Self-pay

## 2016-06-20 ENCOUNTER — Emergency Department (HOSPITAL_BASED_OUTPATIENT_CLINIC_OR_DEPARTMENT_OTHER)
Admission: EM | Admit: 2016-06-20 | Discharge: 2016-06-21 | Disposition: A | Discharge status: Home / Self Care | Payer: BLUE CROSS/BLUE SHIELD | Attending: Emergency Medicine | Admitting: Emergency Medicine

## 2016-06-20 DIAGNOSIS — Z79899 Other long term (current) drug therapy: Secondary | ICD-10-CM | POA: Insufficient documentation

## 2016-06-20 DIAGNOSIS — I10 Essential (primary) hypertension: Secondary | ICD-10-CM | POA: Insufficient documentation

## 2016-06-20 DIAGNOSIS — R1031 Right lower quadrant pain: Secondary | ICD-10-CM | POA: Diagnosis not present

## 2016-06-20 DIAGNOSIS — N3289 Other specified disorders of bladder: Secondary | ICD-10-CM

## 2016-06-20 DIAGNOSIS — R52 Pain, unspecified: Secondary | ICD-10-CM

## 2016-06-20 DIAGNOSIS — N3090 Cystitis, unspecified without hematuria: Secondary | ICD-10-CM | POA: Insufficient documentation

## 2016-06-20 DIAGNOSIS — Z87891 Personal history of nicotine dependence: Secondary | ICD-10-CM | POA: Diagnosis not present

## 2016-06-20 DIAGNOSIS — R339 Retention of urine, unspecified: Secondary | ICD-10-CM | POA: Diagnosis present

## 2016-06-20 HISTORY — DX: Essential (primary) hypertension: I10

## 2016-06-20 HISTORY — DX: Anemia, unspecified: D64.9

## 2016-06-20 LAB — URINE MICROSCOPIC-ADD ON

## 2016-06-20 LAB — BASIC METABOLIC PANEL
ANION GAP: 12 (ref 5–15)
BUN: 17 mg/dL (ref 6–20)
CALCIUM: 9.3 mg/dL (ref 8.9–10.3)
CHLORIDE: 104 mmol/L (ref 101–111)
CO2: 21 mmol/L — AB (ref 22–32)
CREATININE: 0.79 mg/dL (ref 0.44–1.00)
GFR calc non Af Amer: >60 mL/min (ref >60)
GLUCOSE: 96 mg/dL (ref 65–99)
Potassium: 3.8 mmol/L (ref 3.5–5.1)
Sodium: 137 mmol/L (ref 135–145)

## 2016-06-20 LAB — CBC
HEMATOCRIT: 37.1 % (ref 36.0–46.0)
HEMOGLOBIN: 12.2 g/dL (ref 12.0–15.0)
MCH: 29.2 pg (ref 26.0–34.0)
MCHC: 32.9 g/dL (ref 30.0–36.0)
MCV: 88.8 fL (ref 78.0–100.0)
Platelets: 347 10*3/uL (ref 150–400)
RBC: 4.18 MIL/uL (ref 3.87–5.11)
RDW: 15 % (ref 11.5–15.5)
WBC: 8.3 10*3/uL (ref 4.0–10.5)

## 2016-06-20 LAB — URINALYSIS, ROUTINE W REFLEX MICROSCOPIC
Bilirubin Urine: NEGATIVE
GLUCOSE, UA: NEGATIVE mg/dL
Ketones, ur: NEGATIVE mg/dL
Nitrite: POSITIVE — AB
Protein, ur: 100 mg/dL — AB
SPECIFIC GRAVITY, URINE: 1.005 (ref 1.005–1.030)
pH: 6 (ref 5.0–8.0)

## 2016-06-20 LAB — PREGNANCY, URINE: Preg Test, Ur: NEGATIVE

## 2016-06-20 MED ORDER — ONDANSETRON HCL 4 MG/2ML IJ SOLN
4.0000 mg | Freq: Once | INTRAMUSCULAR | Status: AC
  Administered 2016-06-20: 4 mg via INTRAVENOUS
  Filled 2016-06-20: qty 2

## 2016-06-20 MED ORDER — DEXTROSE 5 % IV SOLN
1.0000 g | Freq: Once | INTRAVENOUS | Status: AC
  Administered 2016-06-20: 1 g via INTRAVENOUS
  Filled 2016-06-20: qty 10

## 2016-06-20 MED ORDER — PHENAZOPYRIDINE HCL 200 MG PO TABS: 200.0000 mg | ORAL_TABLET | Freq: Three times a day (TID) | ORAL | Status: DC

## 2016-06-20 MED ORDER — ONDANSETRON HCL 4 MG/2ML IJ SOLN
INTRAMUSCULAR | Status: AC
  Administered 2016-06-20: 4 mg
  Filled 2016-06-20: qty 2

## 2016-06-20 MED ORDER — AMOXICILLIN-POT CLAVULANATE 875-125 MG PO TABS: 1.0000 | ORAL_TABLET | Freq: Two times a day (BID) | ORAL | Status: DC

## 2016-06-20 MED ORDER — OXYCODONE-ACETAMINOPHEN 5-325 MG PO TABS: 2.0000 | ORAL_TABLET | ORAL | Status: DC | PRN

## 2016-06-20 MED ORDER — HYDROMORPHONE HCL 1 MG/ML IJ SOLN
INTRAMUSCULAR | Status: AC
  Administered 2016-06-20: 1 mg
  Filled 2016-06-20: qty 1

## 2016-06-20 MED ORDER — OXYCODONE-ACETAMINOPHEN 5-325 MG PO TABS
2.0000 | ORAL_TABLET | Freq: Once | ORAL | Status: AC
  Administered 2016-06-20: 2 via ORAL
  Filled 2016-06-20: qty 2

## 2016-06-20 MED ORDER — HYDROMORPHONE HCL 1 MG/ML IJ SOLN
1.0000 mg | Freq: Once | INTRAMUSCULAR | Status: AC
  Administered 2016-06-20: 1 mg via INTRAVENOUS
  Filled 2016-06-20: qty 1

## 2016-06-20 MED ORDER — DIAZEPAM 5 MG/ML IJ SOLN
2.5000 mg | Freq: Once | INTRAMUSCULAR | Status: AC
Start: 2016-06-20 — End: 2016-06-20
  Administered 2016-06-20: via INTRAVENOUS
  Filled 2016-06-20: qty 2

## 2016-06-20 MED ORDER — FENTANYL CITRATE (PF) 100 MCG/2ML IJ SOLN
INTRAMUSCULAR | Status: AC
  Administered 2016-06-20: 50 ug
  Filled 2016-06-20: qty 2

## 2016-06-20 MED ORDER — PHENAZOPYRIDINE HCL 100 MG PO TABS
200.0000 mg | ORAL_TABLET | Freq: Once | ORAL | Status: AC
  Administered 2016-06-20: 200 mg via ORAL
  Filled 2016-06-20: qty 2

## 2016-06-20 MED ORDER — DIAZEPAM 5 MG PO TABS: 5.0000 mg | ORAL_TABLET | Freq: Three times a day (TID) | ORAL | Status: DC | PRN

## 2016-06-20 NOTE — ED Provider Notes (Signed)
CSN: [id number]     Arrival date & time 06/20/16  2046 History  By signing my name below, I, Soijett Blue, attest that this documentation has been prepared under the direction and in the presence of Charlesetta Shanks, MD. Electronically Signed: [place], ED Scribe. 06/20/2016. 9:54 PM.   Chief Complaint  Patient presents with  . Urinary Retention      The history is provided by the patient. No language interpreter was used.    HPI Comments: Jamie Mcdowell is a 55 y.o. female who presents to the Emergency Department complaining of difficulty urinating onset 3 hours ago PTA. Pt reports that her symptoms began with dysuria yesterday that she self treated with an old cipro Rx with her last dose being this afternoon. Pt notes that she has had three total doses of cipro. Pt states that she called an pharmacist and was informed that she could take pyridium for her symptoms, which she did. She states that she is having associated symptoms of RLQ abdominal pain, bladder spasms, dysuria, nausea, vomiting, diarrhea, and decreased urine output.  Pt states that she has had bladder spasms in the past. Denies any chronic Interstitial cystitis or uterine fibroid issues. Pt notes that she hasn't had a hysterectomy in the past. She states that she has tried cipro and pyridium with no relief for her symptoms. She denies fever, constipation, and any other symptoms.   Past Medical History  Diagnosis Date  . Anemia   . Hypertension    History reviewed. No pertinent past surgical history. No family history on file. Social History  Substance Use Topics  . Smoking status: Former Smoker -- 0.50 packs/day for 8 years    Types: Cigarettes    Start date: 01/19/1975    Quit date: 01/19/1983  . Smokeless tobacco: Never Used     Comment: quit 31 years ago  . Alcohol Use: No   OB History    No data available     Review of Systems  Constitutional: Negative for fever.  Gastrointestinal: Positive for nausea,  vomiting, abdominal pain (RLQ) and diarrhea. Negative for constipation.  Genitourinary: Positive for dysuria, decreased urine volume and difficulty urinating.       Bladder spasms  10 Systems reviewed and are negative for acute change except as noted in the HPI.     Allergies  Ciprocinonide; Penicillins; and Sulfa antibiotics  Home Medications   Prior to Admission medications   Medication Sig Start Date End Date Taking? Authorizing Provider  amoxicillin-clavulanate (AUGMENTIN) 875-125 MG tablet Take 1 tablet by mouth 2 (two) times daily. One po bid x 7 days 06/20/16   Charlesetta Shanks, MD  cloNIDine (CATAPRES) 0.2 MG tablet Take 1 tablet (0.2 mg total) by mouth 2 (two) times daily. 06/02/15   Volanda Napoleon, MD  diazepam (VALIUM) 5 MG tablet Take 1 tablet (5 mg total) by mouth every 8 (eight) hours as needed for anxiety or muscle spasms. 06/20/16   Charlesetta Shanks, MD  Fexofenadine-Pseudoephedrine (ALLEGRA-D 24 HOUR PO) Take by mouth daily.      Historical Provider, MD  omeprazole (PRILOSEC) 10 MG capsule Take 20 mg by mouth daily.     Historical Provider, MD  oxyCODONE-acetaminophen (PERCOCET) 5-325 MG tablet Take 2 tablets by mouth every 4 (four) hours as needed. 06/20/16   Charlesetta Shanks, MD  phenazopyridine (PYRIDIUM) 200 MG tablet Take 1 tablet (200 mg total) by mouth 3 (three) times daily. 06/20/16   Charlesetta Shanks, MD  spironolactone (ALDACTONE) 25  MG tablet Reported on 04/20/2016 03/08/16   Historical Provider, MD   BP 141/102 mmHg  Pulse 120  Temp(Src) 98.5 F (36.9 C) (Oral)  Resp 28  SpO2 100% Physical Exam  Constitutional: She is oriented to person, place, and time. She appears well-developed and well-nourished. She appears distressed.  Patient presents to be in severe pain. She is alert and nontoxic. No respiratory distress.  HENT:  Head: Normocephalic and atraumatic.  Eyes: EOM are normal.  Neck: Neck supple.  Cardiovascular: Normal rate, regular rhythm and normal heart  sounds.   Pulmonary/Chest: Effort normal and breath sounds normal. No respiratory distress.  Abdominal: Soft. She exhibits no distension. There is tenderness.  Severe suprapubic tenderness to palpation. No guarding or rebound. No CVA tenderness.  Musculoskeletal: Normal range of motion. She exhibits no edema or tenderness.  Neurological: She is alert and oriented to person, place, and time. Coordination normal.  Skin: Skin is warm and dry.  Psychiatric: She has a normal mood and affect. Her behavior is normal.  Nursing note and vitals reviewed.   ED Course  Procedures (including critical care time) DIAGNOSTIC STUDIES: Oxygen Saturation is 100% on RA, nl by my interpretation.    COORDINATION OF CARE: 9:53 PM Discussed treatment plan with pt at bedside which includes UA, labs, dilaudid, fentanyl, zofran, CT abdomen pelvis and pt agreed to plan.    Labs Review Labs Reviewed  URINALYSIS, ROUTINE W REFLEX MICROSCOPIC (NOT AT [facility]) - Abnormal; Notable for the following:    Color, Urine AMBER (*)    APPearance CLOUDY (*)    Hgb urine dipstick LARGE (*)    Protein, ur 100 (*)    Nitrite POSITIVE (*)    Leukocytes, UA LARGE (*)    All other components within normal limits  BASIC METABOLIC PANEL - Abnormal; Notable for the following:    CO2 21 (*)    All other components within normal limits  URINE MICROSCOPIC-ADD ON - Abnormal; Notable for the following:    Squamous Epithelial / LPF 0-5 (*)    Bacteria, UA MANY (*)    All other components within normal limits  URINE CULTURE  CBC  PREGNANCY, URINE    Imaging Review Ct Renal Stone Study  06/20/2016  CLINICAL DATA:  55 year old female with suprapubic pain EXAM: CT ABDOMEN AND PELVIS WITHOUT CONTRAST TECHNIQUE: Multidetector CT imaging of the abdomen and pelvis was performed following the standard protocol without IV contrast. COMPARISON:  Abdominal radiograph dated 03/01/2015 and CT dated 04/24/2012 FINDINGS: Evaluation of this exam  is limited in the absence of intravenous contrast. The visualized lung bases are clear. No intra-abdominal free air or free fluid. The liver, gallbladder, pancreas, spleen, adrenal glands appear unremarkable. Subcentimeter left renal exophytic hypodense lesion is not well characterized on the CT but appears grossly stable or minimally increased in size compared the prior study. Ultrasound may provide better characterization with there is no hydronephrosis or nephrolithiasis on either side. The visualized ureters appear unremarkable. The urinary bladder is collapsed. The uterus and ovaries are grossly unremarkable. There is sigmoid diverticulosis without acute inflammatory changes. There is a large hiatal hernia containing portion of the stomach. No evidence of bowel obstruction or active inflammation. Normal appendix. The abdominal aorta and IVC are grossly unremarkable on this noncontrast study. No portal venous gas identified. There is no adenopathy. The abdominal wall soft tissues appear unremarkable. The osseous structures are intact. IMPRESSION: No acute intra-abdominal pelvic pathology. Specifically there is no hydronephrosis or nephrolithiasis. Large hiatal  hernia containing portion of the stomach. No bowel obstruction or active inflammation. Normal appendix. Sigmoid diverticulosis. Electronically Signed   By: Anner Crete M.D.   On: 06/20/2016 22:51   I have personally reviewed and evaluated these images and lab results as part of my medical decision-making.    MDM   Final diagnoses:  Bladder infection  Bladder spasm  Severe pain   Patient presents with severe, spasmodic suprapubic pain. Urine is grossly positive. The scan does not show any kidney stone or other acute anomaly. Patient has incidental hiatal hernia. No flank pain. Patient presents clinically well in appearance but in severe pain. Patient has been given fentanyl, Dilaudid, Pyridium, Percocet and Valium for symptom control.  Patient denies a history of interstitial cystitis. There is no evidence of urinary retention. Patient is given Rocephin IV for grossly positive urine. She reports having taken about 2 days of ciprofloxacin from a prior prescription. She reports it was not yet expired. At this time, all findings are consistent with acute cystitis. Patient however has intractable bladder pain in association with this requiring extensive pain control measures. Patient has tolerated this well without any evidence of oversedation.   Charlesetta Shanks, MD 06/21/16 [phone]

## 2016-06-20 NOTE — ED Notes (Signed)
Pt c/o rt lower abd pain x 3 hours,  Burning w urination

## 2016-06-20 NOTE — ED Notes (Signed)
Pt c/o lower pelvic pain x3hrs with urinary difficulty and burning with n/v/d

## 2016-06-20 NOTE — ED Notes (Signed)
Bladder scan 122ml  

## 2016-06-20 NOTE — Discharge Instructions (Signed)

## 2016-06-21 MED ORDER — DIAZEPAM 5 MG/ML IJ SOLN
2.5000 mg | Freq: Once | INTRAMUSCULAR | Status: AC
  Administered 2016-06-21: 2.5 mg via INTRAVENOUS
  Filled 2016-06-21: qty 2

## 2016-06-21 NOTE — ED Notes (Signed)
Pt placed on auto vitals Q30.  

## 2016-06-22 LAB — URINE CULTURE: Culture: 1000 — AB

## 2016-07-17 ENCOUNTER — Encounter: Payer: Self-pay | Admitting: *Deleted

## 2016-07-17 ENCOUNTER — Encounter: Payer: Self-pay | Admitting: Family

## 2016-07-17 ENCOUNTER — Other Ambulatory Visit (HOSPITAL_BASED_OUTPATIENT_CLINIC_OR_DEPARTMENT_OTHER): Payer: BLUE CROSS/BLUE SHIELD

## 2016-07-17 ENCOUNTER — Ambulatory Visit (HOSPITAL_BASED_OUTPATIENT_CLINIC_OR_DEPARTMENT_OTHER): Payer: BLUE CROSS/BLUE SHIELD | Admitting: Family

## 2016-07-17 ENCOUNTER — Ambulatory Visit (HOSPITAL_BASED_OUTPATIENT_CLINIC_OR_DEPARTMENT_OTHER): Payer: BLUE CROSS/BLUE SHIELD

## 2016-07-17 VITALS — BP 166/101 | HR 72 | Temp 98.3°F | Resp 20 | Ht 65.0 in | Wt 183.0 lb

## 2016-07-17 DIAGNOSIS — D509 Iron deficiency anemia, unspecified: Secondary | ICD-10-CM | POA: Diagnosis not present

## 2016-07-17 LAB — COMPREHENSIVE METABOLIC PANEL
ALT: 15 U/L (ref 0–55)
ANION GAP: 10 meq/L (ref 3–11)
AST: 12 U/L (ref 5–34)
Albumin: 3.9 g/dL (ref 3.5–5.0)
Alkaline Phosphatase: 86 U/L (ref 40–150)
BILIRUBIN TOTAL: 0.41 mg/dL (ref 0.20–1.20)
BUN: 15 mg/dL (ref 7.0–26.0)
CO2: 25 meq/L (ref 22–29)
CREATININE: 0.8 mg/dL (ref 0.6–1.1)
Calcium: 9.5 mg/dL (ref 8.4–10.4)
Chloride: 109 mEq/L (ref 98–109)
EGFR: 89 mL/min/{1.73_m2} — ABNORMAL LOW (ref >90)
GLUCOSE: 93 mg/dL (ref 70–140)
Potassium: 4.4 mEq/L (ref 3.5–5.1)
Sodium: 143 mEq/L (ref 136–145)
TOTAL PROTEIN: 7.2 g/dL (ref 6.4–8.3)

## 2016-07-17 LAB — CBC WITH DIFFERENTIAL (CANCER CENTER ONLY)
BASO#: 0 10*3/uL (ref 0.0–0.2)
BASO%: 1 % (ref 0.0–2.0)
EOS ABS: 0.1 10*3/uL (ref 0.0–0.5)
EOS%: 2 % (ref 0.0–7.0)
HCT: 41.5 % (ref 34.8–46.6)
HEMOGLOBIN: 13.5 g/dL (ref 11.6–15.9)
LYMPH#: 1.7 10*3/uL (ref 0.9–3.3)
LYMPH%: 42.2 % (ref 14.0–48.0)
MCH: 29 pg (ref 26.0–34.0)
MCHC: 32.5 g/dL (ref 32.0–36.0)
MCV: 89 fL (ref 81–101)
MONO#: 0.4 10*3/uL (ref 0.1–0.9)
MONO%: 8.8 % (ref 0.0–13.0)
NEUT%: 46 % (ref 39.6–80.0)
NEUTROS ABS: 1.9 10*3/uL (ref 1.5–6.5)
Platelets: 286 10*3/uL (ref 145–400)
RBC: 4.66 10*6/uL (ref 3.70–5.32)
RDW: 14.1 % (ref 11.1–15.7)
WBC: 4.1 10*3/uL (ref 3.9–10.0)

## 2016-07-17 LAB — IRON AND TIBC
%SAT: 24 % (ref 21–57)
Iron: 61 ug/dL (ref 41–142)
TIBC: 251 ug/dL (ref 236–444)
UIBC: 190 ug/dL (ref 120–384)

## 2016-07-17 LAB — FERRITIN: FERRITIN: 337 ng/mL — AB (ref 9–269)

## 2016-07-17 MED ORDER — SODIUM CHLORIDE 0.9 % IV SOLN
510.0000 mg | Freq: Once | INTRAVENOUS | Status: AC
  Administered 2016-07-17: 510 mg via INTRAVENOUS
  Filled 2016-07-17: qty 17

## 2016-07-17 MED ORDER — SODIUM CHLORIDE 0.9 % IV SOLN
Freq: Once | INTRAVENOUS | Status: AC
  Administered 2016-07-17: 11:00:00 via INTRAVENOUS

## 2016-07-17 NOTE — Progress Notes (Signed)
Hematology and Oncology Follow Up Visit  Jamie Mcdowell [id number] April 02, 1961 55 y.o. 07/17/2016   Principle Diagnosis:  Recurrent iron deficiency anemia  Current Therapy:   IV iron as indicated     Interim History:  Jamie Mcdowell is here today for a follow-up. She is having a hard time and is symptomatic with fatigue, muscle weakness, lightheadedness, "brain fog", SOB with exertion and dark tarry stools off and on. She is tearful today talking about how she feels and can not understand why she isn't feeling better.  She has seen Dr. Paulita Fujita and had both an endoscopy and colonoscopy revealing a hiatal hernia and hemorrhoids. She is on Prilosec 20 mg PO daily. She would like to do the camera pill at some point but the cost has her holding off for now.  Her TSH in February was 0.942. She plans to discuss how she feels with her PCP and see if she needs any further work up.  She last received Feraheme in June. Her iron saturation at that time was 18% with a ferritin of 434.  No fever, chills, n/v, cough, rash, chest pain, abdominal pain or changes in bladder habits.  No swelling, tenderness, numbness or tingling. No c/o joint aches pains.  She has a good appetite and is staying well hydrated. Her weight is stable.   Medications:    Medication List       Accurate as of 07/17/16 10:34 AM. Always use your most recent med list.          cloNIDine 0.2 MG tablet Commonly known as:  CATAPRES Take 1 tablet (0.2 mg total) by mouth 2 (two) times daily.   omeprazole 10 MG capsule Commonly known as:  PRILOSEC Take 20 mg by mouth daily.   spironolactone 25 MG tablet Commonly known as:  ALDACTONE Reported on 04/20/2016       Allergies:  Allergies  Allergen Reactions  . Ciprocinonide [Fluocinolone]   . Penicillins   . Sulfa Antibiotics     Past Medical History, Surgical history, Social history, and Family History were reviewed and updated.  Review of Systems: All other 10 point review of  systems is negative.   Physical Exam:  height is 5\' 5"  (1.651 m) and weight is 183 lb 0.6 oz (83 kg). Her oral temperature is 98.3 F (36.8 C). Her blood pressure is 166/101 (abnormal) and her pulse is 72. Her respiration is 20.   Wt Readings from Last 3 Encounters:  07/17/16 183 lb 0.6 oz (83 kg)  04/20/16 186 lb (84.4 kg)  01/26/16 185 lb (83.9 kg)    Ocular: Sclerae unicteric, pupils equal, round and reactive to light Ear-nose-throat: Oropharynx clear, dentition fair Lymphatic: No cervical supraclavicular or axillary adenopathy Lungs no rales or rhonchi, good excursion bilaterally Heart regular rate and rhythm, no murmur appreciated Abd soft, nontender, positive bowel sounds, no liver or spleen tip palpated on exam, no fluid wave  MSK no focal spinal tenderness, no joint edema Neuro: non-focal, well-oriented, appropriate affect Breasts: Deferred  Lab Results  Component Value Date   WBC 4.1 07/17/2016   HGB 13.5 07/17/2016   HCT 41.5 07/17/2016   MCV 89 07/17/2016   PLT 286 07/17/2016   Lab Results  Component Value Date   FERRITIN 434 (H) 06/01/2016   IRON 44 06/01/2016   TIBC 243 06/01/2016   UIBC 198 06/01/2016   IRONPCTSAT 18 (L) 06/01/2016   Lab Results  Component Value Date   RETICCTPCT 2.9 (H) 12/12/2015  RBC 4.66 07/17/2016   RETICCTABS 139.2 12/12/2015   No results found for: KPAFRELGTCHN, LAMBDASER, KAPLAMBRATIO No results found for: Kandis Cocking, [facility] Lab Results  Component Value Date   TOTALPROTELP 6.8 02/28/2012   ALBUMINELP 57.8 02/28/2012   A1GS 4.0 02/28/2012   A2GS 11.7 02/28/2012   BETS 6.7 02/28/2012   BETA2SER 5.2 02/28/2012   GAMS 14.6 02/28/2012   MSPIKE NOT DET 02/28/2012   SPEI * 02/28/2012     Chemistry      Component Value Date/Time   NA 137 06/20/2016 2105   NA 141 06/01/2016 1333   K 3.8 06/20/2016 2105   K 4.0 06/01/2016 1333   CL 104 06/20/2016 2105   CL 103 03/09/2016 1507   CL 105 09/02/2012 0859   CO2 21 (L)  06/20/2016 2105   CO2 24 06/01/2016 1333   BUN 17 06/20/2016 2105   BUN 11.6 06/01/2016 1333   CREATININE 0.79 06/20/2016 2105   CREATININE 0.8 06/01/2016 1333      Component Value Date/Time   CALCIUM 9.3 06/20/2016 2105   CALCIUM 9.6 06/01/2016 1333   ALKPHOS 86 06/01/2016 1333   AST 13 06/01/2016 1333   ALT 16 06/01/2016 1333   BILITOT 0.34 06/01/2016 1333     Impression and Plan: Jamie Mcdowell is a 55 yo white female with recurrent iron deficiency anemia. She is symptomatic at this time with fatigue, muscle weakness, lightheadedness, "brain fog", SOB with exertion and dark tarry stools.  With her being symptomatic we will give her a dose of Feraheme today and possibly another dose in 8 days depending on her iron studies.   She is holding off on the camera pill procedure right now due to cost and has discussed this with Dr. Paulita Fujita.  She will follow-up with her PCP as well. We will plan to see her back in 3 weeks for lab work and follow-up.  She will contact us with any questions or concerns. We can certainly see her sooner if need be.   [name], NP 7/25/201710:34 AM

## 2016-07-17 NOTE — Patient Instructions (Signed)

## 2016-07-19 ENCOUNTER — Encounter: Payer: Self-pay | Admitting: *Deleted

## 2016-07-30 ENCOUNTER — Ambulatory Visit: Payer: Self-pay

## 2016-08-07 ENCOUNTER — Telehealth: Payer: Self-pay | Admitting: Emergency Medicine

## 2016-08-07 ENCOUNTER — Ambulatory Visit (HOSPITAL_BASED_OUTPATIENT_CLINIC_OR_DEPARTMENT_OTHER): Payer: BLUE CROSS/BLUE SHIELD | Admitting: Family

## 2016-08-07 ENCOUNTER — Other Ambulatory Visit (HOSPITAL_BASED_OUTPATIENT_CLINIC_OR_DEPARTMENT_OTHER): Payer: BLUE CROSS/BLUE SHIELD

## 2016-08-07 VITALS — BP 141/101 | HR 83 | Temp 98.2°F | Resp 18 | Wt 178.1 lb

## 2016-08-07 DIAGNOSIS — D509 Iron deficiency anemia, unspecified: Secondary | ICD-10-CM | POA: Diagnosis not present

## 2016-08-07 LAB — CBC WITH DIFFERENTIAL (CANCER CENTER ONLY)
BASO#: 0 10*3/uL (ref 0.0–0.2)
BASO%: 0.5 % (ref 0.0–2.0)
EOS%: 1.4 % (ref 0.0–7.0)
Eosinophils Absolute: 0.1 10*3/uL (ref 0.0–0.5)
HCT: 35.1 % (ref 34.8–46.6)
HEMOGLOBIN: 11.6 g/dL (ref 11.6–15.9)
LYMPH#: 1.6 10*3/uL (ref 0.9–3.3)
LYMPH%: 36.3 % (ref 14.0–48.0)
MCH: 30 pg (ref 26.0–34.0)
MCHC: 33 g/dL (ref 32.0–36.0)
MCV: 91 fL (ref 81–101)
MONO#: 0.3 10*3/uL (ref 0.1–0.9)
MONO%: 6.7 % (ref 0.0–13.0)
NEUT%: 55.1 % (ref 39.6–80.0)
NEUTROS ABS: 2.4 10*3/uL (ref 1.5–6.5)
PLATELETS: 282 10*3/uL (ref 145–400)
RBC: 3.87 10*6/uL (ref 3.70–5.32)
RDW: 14.9 % (ref 11.1–15.7)
WBC: 4.4 10*3/uL (ref 3.9–10.0)

## 2016-08-07 LAB — COMPREHENSIVE METABOLIC PANEL
ALBUMIN: 3.7 g/dL (ref 3.5–5.0)
ALK PHOS: 75 U/L (ref 40–150)
ALT: 14 U/L (ref 0–55)
AST: 15 U/L (ref 5–34)
Anion Gap: 8 mEq/L (ref 3–11)
BILIRUBIN TOTAL: 0.32 mg/dL (ref 0.20–1.20)
BUN: 10.1 mg/dL (ref 7.0–26.0)
CO2: 27 mEq/L (ref 22–29)
Calcium: 9.8 mg/dL (ref 8.4–10.4)
Chloride: 109 mEq/L (ref 98–109)
Creatinine: 0.7 mg/dL (ref 0.6–1.1)
EGFR: >90 mL/min/{1.73_m2} (ref >90)
GLUCOSE: 88 mg/dL (ref 70–140)
Potassium: 4.1 mEq/L (ref 3.5–5.1)
SODIUM: 143 meq/L (ref 136–145)
TOTAL PROTEIN: 6.7 g/dL (ref 6.4–8.3)

## 2016-08-07 LAB — IRON AND TIBC
%SAT: 19 % — ABNORMAL LOW (ref 21–57)
Iron: 50 ug/dL (ref 41–142)
TIBC: 258 ug/dL (ref 236–444)
UIBC: 208 ug/dL (ref 120–384)

## 2016-08-07 LAB — FERRITIN: Ferritin: 338 ng/ml — ABNORMAL HIGH (ref 9–269)

## 2016-08-07 NOTE — Telephone Encounter (Signed)
Left message on identified voicemail instructin patient that her iron is low and that she will need to come in this week for one feraheme infusion. Instructed patient that the scheduler will call her to set this up or she may call if she hasn't received a call this week.

## 2016-08-07 NOTE — Progress Notes (Signed)
Hematology and Oncology Follow Up Visit  Jamie Mcdowell [id number] 1961/05/20 55 y.o. 08/07/2016   Principle Diagnosis:  Recurrent iron deficiency anemia  Current Therapy:   IV iron as indicated - last received in July 2017    Interim History:  Jamie Mcdowell is here today for a follow-up. She is doing well and denies any fatigue or "brain fog" at this time. She is getting ready to start back with school on August 28th.  She still has some SOB with exertion at times and has had a few episodes of dizziness which she attributes to her BP.  She has had a few dark tarry stools. This comes and goes. She has had no maroon or BRB. She plans to follow up with Dr. Paulita Fujita regarding the camera pill this week.  No fever, chills, n/v, cough, rash, chest pain, abdominal pain or changes in bladder habits.  No swelling, tenderness, numbness or tingling. No c/o joint aches pains.  She has a good appetite and is staying well hydrated. Her weight is stable.   Medications:    Medication List       Accurate as of 08/07/16 11:48 AM. Always use your most recent med list.          cloNIDine 0.2 MG tablet Commonly known as:  CATAPRES Take 1 tablet (0.2 mg total) by mouth 2 (two) times daily.   omeprazole 10 MG capsule Commonly known as:  PRILOSEC Take 20 mg by mouth daily.   spironolactone 25 MG tablet Commonly known as:  ALDACTONE Reported on 04/20/2016       Allergies:  Allergies  Allergen Reactions  . Ciprocinonide [Fluocinolone]   . Penicillins   . Sulfa Antibiotics     Past Medical History, Surgical history, Social history, and Family History were reviewed and updated.  Review of Systems: All other 10 point review of systems is negative.   Physical Exam:  vitals were not taken for this visit.  Wt Readings from Last 3 Encounters:  07/17/16 183 lb 0.6 oz (83 kg)  04/20/16 186 lb (84.4 kg)  01/26/16 185 lb (83.9 kg)    Ocular: Sclerae unicteric, pupils equal, round and reactive to  light Ear-nose-throat: Oropharynx clear, dentition fair Lymphatic: No cervical supraclavicular or axillary adenopathy Lungs no rales or rhonchi, good excursion bilaterally Heart regular rate and rhythm, no murmur appreciated Abd soft, nontender, positive bowel sounds, no liver or spleen tip palpated on exam, no fluid wave  MSK no focal spinal tenderness, no joint edema Neuro: non-focal, well-oriented, appropriate affect Breasts: Deferred  Lab Results  Component Value Date   WBC 4.4 08/07/2016   HGB 11.6 08/07/2016   HCT 35.1 08/07/2016   MCV 91 08/07/2016   PLT 282 08/07/2016   Lab Results  Component Value Date   FERRITIN 337 (H) 07/17/2016   IRON 61 07/17/2016   TIBC 251 07/17/2016   UIBC 190 07/17/2016   IRONPCTSAT 24 07/17/2016   Lab Results  Component Value Date   RETICCTPCT 2.9 (H) 12/12/2015   RBC 3.87 08/07/2016   RETICCTABS 139.2 12/12/2015   No results found for: KPAFRELGTCHN, LAMBDASER, KAPLAMBRATIO No results found for: Kandis Cocking, IGMSERUM Lab Results  Component Value Date   TOTALPROTELP 6.8 02/28/2012   ALBUMINELP 57.8 02/28/2012   A1GS 4.0 02/28/2012   A2GS 11.7 02/28/2012   BETS 6.7 02/28/2012   BETA2SER 5.2 02/28/2012   GAMS 14.6 02/28/2012   MSPIKE NOT DET 02/28/2012   SPEI * 02/28/2012  Chemistry      Component Value Date/Time   NA 143 07/17/2016 0933   K 4.4 07/17/2016 0933   CL 104 06/20/2016 2105   CL 103 03/09/2016 1507   CL 105 09/02/2012 0859   CO2 25 07/17/2016 0933   BUN 15.0 07/17/2016 0933   CREATININE 0.8 07/17/2016 0933      Component Value Date/Time   CALCIUM 9.5 07/17/2016 0933   ALKPHOS 86 07/17/2016 0933   AST 12 07/17/2016 0933   ALT 15 07/17/2016 0933   BILITOT 0.41 07/17/2016 0933     Impression and Plan: Jamie Mcdowell is a 55 yo white female with recurrent iron deficiency anemia. She is doing well and is asymptomatic at this time. She received Feraheme last month. Her iron saturation is still a bit low at 17%  with a ferritin of 337.  We will plan to give her 1 dose of Feraheme this week and then see her back in 1 month for repeat lab work and follow-up.   She will contact us with any questions or concerns. We can certainly see her sooner if need be.   [name], NP 8/15/201711:48 AM

## 2016-08-08 ENCOUNTER — Telehealth: Payer: Self-pay | Admitting: *Deleted

## 2016-08-08 NOTE — Telephone Encounter (Signed)
Appointment made for Iron Thursday 08/09/16

## 2016-08-09 ENCOUNTER — Ambulatory Visit (HOSPITAL_BASED_OUTPATIENT_CLINIC_OR_DEPARTMENT_OTHER): Payer: BLUE CROSS/BLUE SHIELD

## 2016-08-09 VITALS — BP 147/75 | HR 72 | Temp 98.2°F | Resp 18

## 2016-08-09 DIAGNOSIS — D509 Iron deficiency anemia, unspecified: Secondary | ICD-10-CM

## 2016-08-09 MED ORDER — SODIUM CHLORIDE 0.9 % IV SOLN
510.0000 mg | Freq: Once | INTRAVENOUS | Status: AC
  Administered 2016-08-09: 510 mg via INTRAVENOUS
  Filled 2016-08-09: qty 17

## 2016-08-09 MED ORDER — SODIUM CHLORIDE 0.9 % IV SOLN
Freq: Once | INTRAVENOUS | Status: AC
  Administered 2016-08-09: 12:00:00 via INTRAVENOUS

## 2016-08-09 NOTE — Patient Instructions (Signed)

## 2016-09-05 ENCOUNTER — Other Ambulatory Visit: Payer: Self-pay

## 2016-09-05 ENCOUNTER — Ambulatory Visit: Payer: Self-pay | Admitting: Family

## 2016-09-07 ENCOUNTER — Other Ambulatory Visit: Payer: Self-pay

## 2016-09-07 ENCOUNTER — Ambulatory Visit: Payer: Self-pay | Admitting: Family

## 2016-09-14 ENCOUNTER — Ambulatory Visit (HOSPITAL_BASED_OUTPATIENT_CLINIC_OR_DEPARTMENT_OTHER): Payer: BLUE CROSS/BLUE SHIELD | Admitting: Family

## 2016-09-14 ENCOUNTER — Encounter: Payer: Self-pay | Admitting: *Deleted

## 2016-09-14 ENCOUNTER — Other Ambulatory Visit (HOSPITAL_BASED_OUTPATIENT_CLINIC_OR_DEPARTMENT_OTHER): Payer: BLUE CROSS/BLUE SHIELD

## 2016-09-14 ENCOUNTER — Ambulatory Visit (HOSPITAL_BASED_OUTPATIENT_CLINIC_OR_DEPARTMENT_OTHER): Payer: BLUE CROSS/BLUE SHIELD

## 2016-09-14 ENCOUNTER — Encounter: Payer: Self-pay | Admitting: Family

## 2016-09-14 VITALS — BP 143/93 | HR 70 | Temp 98.9°F | Resp 16 | Ht 65.0 in | Wt 181.1 lb

## 2016-09-14 DIAGNOSIS — D509 Iron deficiency anemia, unspecified: Secondary | ICD-10-CM | POA: Diagnosis not present

## 2016-09-14 LAB — CBC WITH DIFFERENTIAL (CANCER CENTER ONLY)
BASO#: 0 10*3/uL (ref 0.0–0.2)
BASO%: 0.7 % (ref 0.0–2.0)
EOS ABS: 0.1 10*3/uL (ref 0.0–0.5)
EOS%: 2.3 % (ref 0.0–7.0)
HCT: 39.4 % (ref 34.8–46.6)
HGB: 13.1 g/dL (ref 11.6–15.9)
LYMPH#: 1.3 10*3/uL (ref 0.9–3.3)
LYMPH%: 42.7 % (ref 14.0–48.0)
MCH: 29.7 pg (ref 26.0–34.0)
MCHC: 33.2 g/dL (ref 32.0–36.0)
MCV: 89 fL (ref 81–101)
MONO#: 0.3 10*3/uL (ref 0.1–0.9)
MONO%: 11.3 % (ref 0.0–13.0)
NEUT#: 1.3 10*3/uL — ABNORMAL LOW (ref 1.5–6.5)
NEUT%: 43 % (ref 39.6–80.0)
PLATELETS: 261 10*3/uL (ref 145–400)
RBC: 4.41 10*6/uL (ref 3.70–5.32)
RDW: 13.5 % (ref 11.1–15.7)
WBC: 3 10*3/uL — ABNORMAL LOW (ref 3.9–10.0)

## 2016-09-14 LAB — IRON AND TIBC
%SAT: 24 % (ref 21–57)
Iron: 58 ug/dL (ref 41–142)
TIBC: 243 ug/dL (ref 236–444)
UIBC: 185 ug/dL (ref 120–384)

## 2016-09-14 LAB — COMPREHENSIVE METABOLIC PANEL
ALBUMIN: 3.8 g/dL (ref 3.5–5.0)
ALK PHOS: 94 U/L (ref 40–150)
ALT: 21 U/L (ref 0–55)
AST: 18 U/L (ref 5–34)
Anion Gap: 10 mEq/L (ref 3–11)
BUN: 13.6 mg/dL (ref 7.0–26.0)
CO2: 23 meq/L (ref 22–29)
Calcium: 9.5 mg/dL (ref 8.4–10.4)
Chloride: 109 mEq/L (ref 98–109)
Creatinine: 0.7 mg/dL (ref 0.6–1.1)
GLUCOSE: 90 mg/dL (ref 70–140)
POTASSIUM: 4 meq/L (ref 3.5–5.1)
SODIUM: 142 meq/L (ref 136–145)
Total Bilirubin: 0.39 mg/dL (ref 0.20–1.20)
Total Protein: 7 g/dL (ref 6.4–8.3)

## 2016-09-14 LAB — FERRITIN: Ferritin: 331 ng/ml — ABNORMAL HIGH (ref 9–269)

## 2016-09-14 MED ORDER — SODIUM CHLORIDE 0.9 % IV SOLN
510.0000 mg | Freq: Once | INTRAVENOUS | Status: AC
  Administered 2016-09-14: 510 mg via INTRAVENOUS
  Filled 2016-09-14: qty 17

## 2016-09-14 NOTE — Patient Instructions (Signed)

## 2016-09-14 NOTE — Progress Notes (Signed)
Hematology and Oncology Follow Up Visit  Jamie Mcdowell [id number] [birth year] 55 y.o. 09/14/2016   Principle Diagnosis:  Recurrent iron deficiency anemia  Current Therapy:   IV iron as indicated - last received in July 2017    Interim History:  Jamie Mcdowell is here today for a follow-up. She is symptomatic with SOB with exertion, weakness, fatigue and dizziness. 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 She denies having any syncopal episodes.  She received Feraheme in August. Her iron saturation at that time was 19% with a ferritin of 338.  No fever, chills, n/v, cough, rash, chest pain, abdominal pain or changes in bladder habits.  She has noticed some bright red blood in her stool. She has an appointment with Dr. Paulita Fujita in a couple weeks to discuss further evaluation for cause of bleed.  No swelling, tenderness, numbness or tingling. No c/o joint aches pains.  She has maintained a good appetite and is staying well hydrated. Her weight is stable.   Medications:    Medication List       Accurate as of 09/14/16 11:31 AM. Always use your most recent med list.          cloNIDine 0.2 MG tablet Commonly known as:  CATAPRES Take 1 tablet (0.2 mg total) by mouth 2 (two) times daily.   omeprazole 10 MG capsule Commonly known as:  PRILOSEC Take 20 mg by mouth daily.   spironolactone 25 MG tablet Commonly known as:  ALDACTONE Reported on 04/20/2016       Allergies:  Allergies  Allergen Reactions  . Ciprocinonide [Fluocinolone]   . Penicillins   . Sulfa Antibiotics     Past Medical History, Surgical history, Social history, and Family History were reviewed and updated.  Review of Systems: All other 10 point review of systems is negative.   Physical Exam:  height is 5\' 5"  (1.651 m) and weight is 181 lb 1.9 oz (82.2 kg). Her oral temperature is 98.9 F (37.2 C). Her blood pressure is 143/93 (abnormal) and her pulse is 70. Her respiration is 16.    Wt Readings from Last 3 Encounters:  09/14/16 181 lb 1.9 oz (82.2 kg)  08/07/16 178 lb 1.9 oz (80.8 kg)  07/17/16 183 lb 0.6 oz (83 kg)    Ocular: Sclerae unicteric, pupils equal, round and reactive to light Ear-nose-throat: Oropharynx clear, dentition fair Lymphatic: No cervical supraclavicular or axillary adenopathy Lungs no rales or rhonchi, good excursion bilaterally Heart regular rate and rhythm, no murmur appreciated Abd soft, nontender, positive bowel sounds, no liver or spleen tip palpated on exam, no fluid wave  MSK no focal spinal tenderness, no joint edema Neuro: non-focal, well-oriented, appropriate affect Breasts: Deferred  Lab Results  Component Value Date   WBC 4.4 08/07/2016   HGB 11.6 08/07/2016   HCT 35.1 08/07/2016   MCV 91 08/07/2016   PLT 282 08/07/2016   Lab Results  Component Value Date   FERRITIN 338 (H) 08/07/2016   IRON 50 08/07/2016   TIBC 258 08/07/2016   UIBC 208 08/07/2016   IRONPCTSAT 19 (L) 08/07/2016   Lab Results  Component Value Date   RETICCTPCT 2.9 (H) 12/12/2015   RBC 3.87 08/07/2016   RETICCTABS 139.2 12/12/2015   No results found for: KPAFRELGTCHN, LAMBDASER, KAPLAMBRATIO No results found for: Kandis Cocking, IGMSERUM Lab Results  Component Value Date   TOTALPROTELP 6.8 02/28/2012   ALBUMINELP 57.8 02/28/2012   A1GS 4.0 02/28/2012  A2GS 11.7 02/28/2012   BETS 6.7 02/28/2012   BETA2SER 5.2 02/28/2012   GAMS 14.6 02/28/2012   MSPIKE NOT DET 02/28/2012   SPEI * 02/28/2012     Chemistry      Component Value Date/Time   NA 143 08/07/2016 1132   K 4.1 08/07/2016 1132   CL 104 06/20/2016 2105   CL 103 03/09/2016 1507   CL 105 09/02/2012 0859   CO2 27 08/07/2016 1132   BUN 10.1 08/07/2016 1132   CREATININE 0.7 08/07/2016 1132      Component Value Date/Time   CALCIUM 9.8 08/07/2016 1132   ALKPHOS 75 08/07/2016 1132   AST 15 08/07/2016 1132   ALT 14 08/07/2016 1132   BILITOT 0.32 08/07/2016 1132     Impression  and Plan: Jamie Mcdowell is a 55 yo white female with recurrent iron deficiency anemia. She is doing well and is asymptomatic at this time. She received Feraheme in August. She is symptomatic at this time with SOB with exertion, fatigue, weakness and dizziness. It is effecting her at work.  We will give her Feraheme today and then review iron studies to see if she needs a second dose in 8 days.  We will plan to see her back in 1 month for repeat lab work and follow-up. She will contact us with any questions or concerns. We can certainly see her sooner if need be.   [name], NP 9/22/201711:31 AM

## 2016-09-28 ENCOUNTER — Encounter: Payer: Self-pay | Admitting: *Deleted

## 2016-10-17 ENCOUNTER — Other Ambulatory Visit (HOSPITAL_COMMUNITY): Payer: Self-pay | Admitting: Gastroenterology

## 2016-10-17 DIAGNOSIS — K219 Gastro-esophageal reflux disease without esophagitis: Secondary | ICD-10-CM

## 2016-10-26 ENCOUNTER — Other Ambulatory Visit (HOSPITAL_BASED_OUTPATIENT_CLINIC_OR_DEPARTMENT_OTHER): Payer: BLUE CROSS/BLUE SHIELD

## 2016-10-26 ENCOUNTER — Ambulatory Visit (HOSPITAL_COMMUNITY)
Admission: RE | Admit: 2016-10-26 | Discharge: 2016-10-26 | Disposition: A | Discharge status: Home / Self Care | Payer: BLUE CROSS/BLUE SHIELD | Source: Ambulatory Visit | Attending: Gastroenterology | Admitting: Gastroenterology

## 2016-10-26 ENCOUNTER — Encounter: Payer: Self-pay | Admitting: Hematology & Oncology

## 2016-10-26 ENCOUNTER — Ambulatory Visit (HOSPITAL_BASED_OUTPATIENT_CLINIC_OR_DEPARTMENT_OTHER): Payer: BLUE CROSS/BLUE SHIELD | Admitting: Hematology & Oncology

## 2016-10-26 ENCOUNTER — Ambulatory Visit: Payer: Self-pay

## 2016-10-26 VITALS — BP 144/105 | HR 72 | Temp 97.8°F | Resp 16 | Ht 65.0 in | Wt 181.0 lb

## 2016-10-26 DIAGNOSIS — D509 Iron deficiency anemia, unspecified: Secondary | ICD-10-CM

## 2016-10-26 DIAGNOSIS — K449 Diaphragmatic hernia without obstruction or gangrene: Secondary | ICD-10-CM | POA: Insufficient documentation

## 2016-10-26 DIAGNOSIS — K219 Gastro-esophageal reflux disease without esophagitis: Secondary | ICD-10-CM | POA: Insufficient documentation

## 2016-10-26 DIAGNOSIS — D500 Iron deficiency anemia secondary to blood loss (chronic): Secondary | ICD-10-CM

## 2016-10-26 DIAGNOSIS — K228 Other specified diseases of esophagus: Secondary | ICD-10-CM | POA: Diagnosis not present

## 2016-10-26 LAB — CBC WITH DIFFERENTIAL (CANCER CENTER ONLY)
BASO#: 0 10*3/uL (ref 0.0–0.2)
BASO%: 0.6 % (ref 0.0–2.0)
EOS ABS: 0.1 10*3/uL (ref 0.0–0.5)
EOS%: 0.9 % (ref 0.0–7.0)
HEMATOCRIT: 41 % (ref 34.8–46.6)
HGB: 13.6 g/dL (ref 11.6–15.9)
LYMPH#: 1.8 10*3/uL (ref 0.9–3.3)
LYMPH%: 32.8 % (ref 14.0–48.0)
MCH: 29.2 pg (ref 26.0–34.0)
MCHC: 33.2 g/dL (ref 32.0–36.0)
MCV: 88 fL (ref 81–101)
MONO#: 0.4 10*3/uL (ref 0.1–0.9)
MONO%: 8.2 % (ref 0.0–13.0)
NEUT#: 3.1 10*3/uL (ref 1.5–6.5)
NEUT%: 57.5 % (ref 39.6–80.0)
PLATELETS: 297 10*3/uL (ref 145–400)
RBC: 4.66 10*6/uL (ref 3.70–5.32)
RDW: 14 % (ref 11.1–15.7)
WBC: 5.4 10*3/uL (ref 3.9–10.0)

## 2016-10-26 LAB — COMPREHENSIVE METABOLIC PANEL
ALK PHOS: 93 U/L (ref 40–150)
ALT: 15 U/L (ref 0–55)
ANION GAP: 10 meq/L (ref 3–11)
AST: 15 U/L (ref 5–34)
Albumin: 3.9 g/dL (ref 3.5–5.0)
BILIRUBIN TOTAL: 0.24 mg/dL (ref 0.20–1.20)
BUN: 13.9 mg/dL (ref 7.0–26.0)
CALCIUM: 9.6 mg/dL (ref 8.4–10.4)
CO2: 24 meq/L (ref 22–29)
CREATININE: 0.7 mg/dL (ref 0.6–1.1)
Chloride: 109 mEq/L (ref 98–109)
Glucose: 94 mg/dl (ref 70–140)
Potassium: 4.3 mEq/L (ref 3.5–5.1)
Sodium: 143 mEq/L (ref 136–145)
TOTAL PROTEIN: 7.4 g/dL (ref 6.4–8.3)

## 2016-10-26 NOTE — Progress Notes (Signed)
Hematology and Oncology Follow Up Visit  Jamie Mcdowell [id number] [birth year] 55 y.o. 10/26/2016   Principle Diagnosis:  Recurrent iron deficiency anemia  Current Therapy:   IV iron as indicated - last received in September 2017    Interim History:  Jamie Mcdowell is here today for a follow-up. She is doing better. She recently had an upper GI with small bowel follow-through today. She is not sure of the results. She is next due for a capsule endoscopy. This might be the next week or so.   She last iron back in September. At that point time, her iron saturation was 24%. For her, this is on the lower side. I will let her think that this is better.   She is watching what she eats very closely.   She has not had any problems with nausea or vomiting. She's had no change in bowel or bladder habits. She's had no rashes. She's had no leg swelling.   She is looking for 2 Thanksgiving. Family will be coming over to her house.   Overall, her performance status is ECOG 0.  Medications:    Medication List       Accurate as of 10/26/16  2:46 PM. Always use your most recent med list.          cloNIDine 0.2 MG tablet Commonly known as:  CATAPRES Take 1 tablet (0.2 mg total) by mouth 2 (two) times daily.   omeprazole 10 MG capsule Commonly known as:  PRILOSEC Take 20 mg by mouth daily.   spironolactone 25 MG tablet Commonly known as:  ALDACTONE Reported on 04/20/2016       Allergies:  Allergies  Allergen Reactions  . Ciprocinonide [Fluocinolone]   . Penicillins   . Sulfa Antibiotics     Past Medical History, Surgical history, Social history, and Family History were reviewed and updated.  Review of Systems: All other 10 point review of systems is negative.   Physical Exam:  height is 5\' 5"  (1.651 m) and weight is 181 lb (82.1 kg). Her oral temperature is 97.8 F (36.6 C). Her blood pressure is 144/105 (abnormal) and her pulse is 72. Her respiration is 16.   Wt Readings from  Last 3 Encounters:  10/26/16 181 lb (82.1 kg)  09/14/16 181 lb 1.9 oz (82.2 kg)  08/07/16 178 lb 1.9 oz (80.8 kg)    Well-developed well-nourished white female in no obvious distress. Head exam shows no ocular or oral lesions. There are no palpable cervical or supraclavicular lymph nodes. Lungs are clear. Cardiac exam regular rate and rhythm with no murmurs, rubs or bruits. Abdomen is soft and she has good bowel sounds. There is no fluid wave. Her bowel sounds IV little but hyperactive secondary to the upper GI today. Extremities shows no clubbing, cyanosis or edema. Neurological exam shows no focal neurological deficits.  Lab Results  Component Value Date   WBC 5.4 10/26/2016   HGB 13.6 10/26/2016   HCT 41.0 10/26/2016   MCV 88 10/26/2016   PLT 297 10/26/2016   Lab Results  Component Value Date   FERRITIN 331 (H) 09/14/2016   IRON 58 09/14/2016   TIBC 243 09/14/2016   UIBC 185 09/14/2016   IRONPCTSAT 24 09/14/2016   Lab Results  Component Value Date   RETICCTPCT 2.9 (H) 12/12/2015   RBC 4.66 10/26/2016   RETICCTABS 139.2 12/12/2015   No results found for: KPAFRELGTCHN, LAMBDASER, KAPLAMBRATIO No results found for: Kandis Cocking, IGMSERUM Lab Results  Component  Value Date   TOTALPROTELP 6.8 02/28/2012   ALBUMINELP 57.8 02/28/2012   A1GS 4.0 02/28/2012   A2GS 11.7 02/28/2012   BETS 6.7 02/28/2012   BETA2SER 5.2 02/28/2012   GAMS 14.6 02/28/2012   MSPIKE NOT DET 02/28/2012   SPEI * 02/28/2012     Chemistry      Component Value Date/Time   NA 142 09/14/2016 1104   K 4.0 09/14/2016 1104   CL 104 06/20/2016 2105   CL 103 03/09/2016 1507   CL 105 09/02/2012 0859   CO2 23 09/14/2016 1104   BUN 13.6 09/14/2016 1104   CREATININE 0.7 09/14/2016 1104      Component Value Date/Time   CALCIUM 9.5 09/14/2016 1104   ALKPHOS 94 09/14/2016 1104   AST 18 09/14/2016 1104   ALT 21 09/14/2016 1104   BILITOT 0.39 09/14/2016 1104     Impression and Plan: Jamie Mcdowell is a 55 yo  white female with recurrent iron deficiency anemia.   I looked at her blood smear today. Her red cells looked alow bit larger. I did not see much microcytic changes.  We will have to see what her iron studies show. Hopefully, she will not need any iron.  I will like to get her back in another month or so. I want to make sure that she gets through the holidays without being too fatigued.   It will be interesting to see what the capsule endoscopy shows.   I think that part of her problems that she just does not absorb iron orally. She has a hiatal hernia. She is on Prilosec.   I will make sure that she take some vitamin C. This might help a little bit. Volanda Napoleon, MD 11/3/20172:46 PM

## 2016-10-29 LAB — IRON AND TIBC
%SAT: 21 % (ref 21–57)
IRON: 53 ug/dL (ref 41–142)
TIBC: 253 ug/dL (ref 236–444)
UIBC: 200 ug/dL (ref 120–384)

## 2016-10-29 LAB — FERRITIN: FERRITIN: 353 ng/mL — AB (ref 9–269)

## 2016-11-29 ENCOUNTER — Other Ambulatory Visit (HOSPITAL_BASED_OUTPATIENT_CLINIC_OR_DEPARTMENT_OTHER): Payer: BLUE CROSS/BLUE SHIELD

## 2016-11-29 ENCOUNTER — Ambulatory Visit (HOSPITAL_BASED_OUTPATIENT_CLINIC_OR_DEPARTMENT_OTHER): Payer: BLUE CROSS/BLUE SHIELD | Admitting: Family

## 2016-11-29 VITALS — BP 144/83 | HR 69 | Temp 97.8°F | Resp 16 | Wt 180.0 lb

## 2016-11-29 DIAGNOSIS — D509 Iron deficiency anemia, unspecified: Secondary | ICD-10-CM

## 2016-11-29 DIAGNOSIS — D500 Iron deficiency anemia secondary to blood loss (chronic): Secondary | ICD-10-CM

## 2016-11-29 LAB — CBC WITH DIFFERENTIAL (CANCER CENTER ONLY)
BASO#: 0.1 10*3/uL (ref 0.0–0.2)
BASO%: 1 % (ref 0.0–2.0)
EOS%: 1.4 % (ref 0.0–7.0)
Eosinophils Absolute: 0.1 10*3/uL (ref 0.0–0.5)
HEMATOCRIT: 37.4 % (ref 34.8–46.6)
HEMOGLOBIN: 12.1 g/dL (ref 11.6–15.9)
LYMPH#: 1.7 10*3/uL (ref 0.9–3.3)
LYMPH%: 34.4 % (ref 14.0–48.0)
MCH: 28.7 pg (ref 26.0–34.0)
MCHC: 32.4 g/dL (ref 32.0–36.0)
MCV: 89 fL (ref 81–101)
MONO#: 0.4 10*3/uL (ref 0.1–0.9)
MONO%: 7.3 % (ref 0.0–13.0)
NEUT%: 55.9 % (ref 39.6–80.0)
NEUTROS ABS: 2.8 10*3/uL (ref 1.5–6.5)
Platelets: 305 10*3/uL (ref 145–400)
RBC: 4.21 10*6/uL (ref 3.70–5.32)
RDW: 14.4 % (ref 11.1–15.7)
WBC: 4.9 10*3/uL (ref 3.9–10.0)

## 2016-11-29 LAB — COMPREHENSIVE METABOLIC PANEL
ALBUMIN: 4 g/dL (ref 3.5–5.0)
ALK PHOS: 90 U/L (ref 40–150)
ALT: 14 U/L (ref 0–55)
AST: 15 U/L (ref 5–34)
Anion Gap: 11 mEq/L (ref 3–11)
BUN: 17.3 mg/dL (ref 7.0–26.0)
CALCIUM: 9.6 mg/dL (ref 8.4–10.4)
CO2: 24 mEq/L (ref 22–29)
CREATININE: 0.8 mg/dL (ref 0.6–1.1)
Chloride: 109 mEq/L (ref 98–109)
EGFR: >90 mL/min/{1.73_m2} (ref >90)
GLUCOSE: 87 mg/dL (ref 70–140)
POTASSIUM: 4.1 meq/L (ref 3.5–5.1)
SODIUM: 143 meq/L (ref 136–145)
TOTAL PROTEIN: 7.3 g/dL (ref 6.4–8.3)
Total Bilirubin: 0.38 mg/dL (ref 0.20–1.20)

## 2016-11-29 NOTE — Progress Notes (Signed)
Hematology and Oncology Follow Up Visit  XIARA BRADT [id number] 11-22-1961 55 y.o. 11/29/2016   Principle Diagnosis:  Recurrent iron deficiency anemia  Current Therapy:   IV iron as indicated - last received in September 2017    Interim History:  Ms. Montani is here today for a follow-up. She is doing well but states she feels that her iron is low. She is having fatigue, SOB with any exertion and palpitations. She also stays very thirsty.  She received Feraheme in September. Her iron saturation in November was 21% with a ferritin of 353.  No fever, chills, n/v, cough, rash, dizziness, chest pain, abdominal pain or changes in bladder habits.  She has a hemorrhoid that has bleed some with BM's. She has IBS and fluctuates between constipation and diarrhea. She is waiting to hear from Dr. Erlinda Hong office to schedule her taking the camera pill.  No swelling, tenderness, numbness or tingling. No c/o joint aches or pain.  She has maintained a good appetite and is staying well hydrated. Her weight is stable.   Medications:    Medication List       Accurate as of 11/29/16  2:58 PM. Always use your most recent med list.          cloNIDine 0.2 MG tablet Commonly known as:  CATAPRES Take 1 tablet (0.2 mg total) by mouth 2 (two) times daily.   omeprazole 10 MG capsule Commonly known as:  PRILOSEC Take 20 mg by mouth daily.   spironolactone 25 MG tablet Commonly known as:  ALDACTONE Reported on 04/20/2016       Allergies:  Allergies  Allergen Reactions  . Ciprocinonide [Fluocinolone]   . Penicillins   . Sulfa Antibiotics     Past Medical History, Surgical history, Social history, and Family History were reviewed and updated.  Review of Systems: All other 10 point review of systems is negative.   Physical Exam:  weight is 180 lb (81.6 kg). Her oral temperature is 97.8 F (36.6 C). Her blood pressure is 144/83 (abnormal) and her pulse is 69. Her respiration is 16.   Wt  Readings from Last 3 Encounters:  11/29/16 180 lb (81.6 kg)  10/26/16 181 lb (82.1 kg)  09/14/16 181 lb 1.9 oz (82.2 kg)    Ocular: Sclerae unicteric, pupils equal, round and reactive to light Ear-nose-throat: Oropharynx clear, dentition fair Lymphatic: No cervical supraclavicular or axillary adenopathy Lungs no rales or rhonchi, good excursion bilaterally Heart regular rate and rhythm, no murmur appreciated Abd soft, nontender, positive bowel sounds, no liver or spleen tip palpated on exam, no fluid wave  MSK no focal spinal tenderness, no joint edema Neuro: non-focal, well-oriented, appropriate affect Breasts: Deferred  Lab Results  Component Value Date   WBC 4.9 11/29/2016   HGB 12.1 11/29/2016   HCT 37.4 11/29/2016   MCV 89 11/29/2016   PLT 305 11/29/2016   Lab Results  Component Value Date   FERRITIN 353 (H) 10/26/2016   IRON 53 10/26/2016   TIBC 253 10/26/2016   UIBC 200 10/26/2016   IRONPCTSAT 21 10/26/2016   Lab Results  Component Value Date   RETICCTPCT 2.9 (H) 12/12/2015   RBC 4.21 11/29/2016   RETICCTABS 139.2 12/12/2015   No results found for: KPAFRELGTCHN, LAMBDASER, KAPLAMBRATIO No results found for: Kandis Cocking, IGMSERUM Lab Results  Component Value Date   TOTALPROTELP 6.8 02/28/2012   ALBUMINELP 57.8 02/28/2012   A1GS 4.0 02/28/2012   A2GS 11.7 02/28/2012   BETS 6.7 02/28/2012  BETA2SER 5.2 02/28/2012   GAMS 14.6 02/28/2012   MSPIKE NOT DET 02/28/2012   SPEI * 02/28/2012     Chemistry      Component Value Date/Time   NA 143 10/26/2016 1344   K 4.3 10/26/2016 1344   CL 104 06/20/2016 2105   CL 103 03/09/2016 1507   CL 105 09/02/2012 0859   CO2 24 10/26/2016 1344   BUN 13.9 10/26/2016 1344   CREATININE 0.7 10/26/2016 1344      Component Value Date/Time   CALCIUM 9.6 10/26/2016 1344   ALKPHOS 93 10/26/2016 1344   AST 15 10/26/2016 1344   ALT 15 10/26/2016 1344   BILITOT 0.24 10/26/2016 1344     Impression and Plan: Ms. Halicki is a  55 yo white female with recurrent iron deficiency anemia. She is symptomatic with fatigue, SOB with exertion and palpitations. She has a hemorrhoid that bleeds at times and is waiting to schedule her taking the camera pill with Dr. Erlinda Hong office.  We will see what her iron studies show and bring her back in tomorrow for an infusion if needed.  We will go ahead and plan to see her back in 1 month for repeat lab work and follow-up. She will contact us with any questions or concerns. We can certainly see her sooner if need be.   [name], NP 12/7/20172:58 PM

## 2016-11-30 ENCOUNTER — Telehealth: Payer: Self-pay

## 2016-11-30 LAB — IRON AND TIBC
%SAT: 15 % — ABNORMAL LOW (ref 21–57)
Iron: 40 ug/dL — ABNORMAL LOW (ref 41–142)
TIBC: 265 ug/dL (ref 236–444)
UIBC: 225 ug/dL (ref 120–384)

## 2016-11-30 LAB — FERRITIN: Ferritin: 199 ng/ml (ref 9–269)

## 2016-11-30 LAB — RETICULOCYTES: Reticulocyte Count: 3.4 % — ABNORMAL HIGH (ref 0.6–2.6)

## 2016-11-30 NOTE — Telephone Encounter (Addendum)
-----   Message from [name], NP sent at 11/30/2016  1:02 PM EST ----- Regarding: Iron  Iron saturation low. She will need one dose of Feraheme please. Thank you!  Jamie Mcdowell  Above message given to pt via phone. Verbalizes understanding and appreciation. Transferred to scheduler for appt. dph

## 2016-12-03 ENCOUNTER — Ambulatory Visit (HOSPITAL_BASED_OUTPATIENT_CLINIC_OR_DEPARTMENT_OTHER): Payer: BLUE CROSS/BLUE SHIELD

## 2016-12-03 VITALS — BP 133/88 | HR 71 | Temp 97.9°F | Resp 20

## 2016-12-03 DIAGNOSIS — D500 Iron deficiency anemia secondary to blood loss (chronic): Secondary | ICD-10-CM

## 2016-12-03 DIAGNOSIS — D509 Iron deficiency anemia, unspecified: Secondary | ICD-10-CM

## 2016-12-03 MED ORDER — SODIUM CHLORIDE 0.9 % IV SOLN
510.0000 mg | Freq: Once | INTRAVENOUS | Status: AC
  Administered 2016-12-03: 510 mg via INTRAVENOUS
  Filled 2016-12-03: qty 17

## 2016-12-03 NOTE — Patient Instructions (Signed)
Ferumoxytol injection What is this medicine? FERUMOXYTOL is an iron complex. Iron is used to make healthy red blood cells, which carry oxygen and nutrients throughout the body. This medicine is used to treat iron deficiency anemia in people with chronic kidney disease. COMMON BRAND NAME(S): Feraheme What should I tell my health care provider before I take this medicine? They need to know if you have any of these conditions: -anemia not caused by low iron levels -high levels of iron in the blood -magnetic resonance imaging (MRI) test scheduled -an unusual or allergic reaction to iron, other medicines, foods, dyes, or preservatives -pregnant or trying to get pregnant -breast-feeding How should I use this medicine? This medicine is for injection into a vein. It is given by a health care professional in a hospital or clinic setting. Talk to your pediatrician regarding the use of this medicine in children. Special care may be needed. What if I miss a dose? It is important not to miss your dose. Call your doctor or health care professional if you are unable to keep an appointment. What may interact with this medicine? This medicine may interact with the following medications: -other iron products What should I watch for while using this medicine? Visit your doctor or healthcare professional regularly. Tell your doctor or healthcare professional if your symptoms do not start to get better or if they get worse. You may need blood work done while you are taking this medicine. You may need to follow a special diet. Talk to your doctor. Foods that contain iron include: whole grains/cereals, dried fruits, beans, or peas, leafy green vegetables, and organ meats (liver, kidney). What side effects may I notice from receiving this medicine? Side effects that you should report to your doctor or health care professional as soon as possible: -allergic reactions like skin rash, itching or hives, swelling of the  face, lips, or tongue -breathing problems -changes in blood pressure -feeling faint or lightheaded, falls -fever or chills -flushing, sweating, or hot feelings -swelling of the ankles or feet Side effects that usually do not require medical attention (report to your doctor or health care professional if they continue or are bothersome): -diarrhea -headache -nausea, vomiting -stomach pain Where should I keep my medicine? This drug is given in a hospital or clinic and will not be stored at home.  2017 Elsevier/Gold Standard (2016-01-12 12:41:49)  

## 2017-01-04 ENCOUNTER — Other Ambulatory Visit: Payer: BLUE CROSS/BLUE SHIELD

## 2017-01-04 ENCOUNTER — Ambulatory Visit: Payer: BLUE CROSS/BLUE SHIELD | Admitting: Hematology & Oncology

## 2017-01-07 ENCOUNTER — Other Ambulatory Visit (HOSPITAL_BASED_OUTPATIENT_CLINIC_OR_DEPARTMENT_OTHER): Payer: BLUE CROSS/BLUE SHIELD

## 2017-01-07 ENCOUNTER — Ambulatory Visit (HOSPITAL_BASED_OUTPATIENT_CLINIC_OR_DEPARTMENT_OTHER): Payer: BLUE CROSS/BLUE SHIELD | Admitting: Hematology & Oncology

## 2017-01-07 VITALS — BP 155/93 | HR 67 | Temp 98.3°F | Wt 178.5 lb

## 2017-01-07 DIAGNOSIS — D500 Iron deficiency anemia secondary to blood loss (chronic): Secondary | ICD-10-CM

## 2017-01-07 DIAGNOSIS — D509 Iron deficiency anemia, unspecified: Secondary | ICD-10-CM

## 2017-01-07 LAB — CBC WITH DIFFERENTIAL (CANCER CENTER ONLY)
BASO#: 0 10*3/uL (ref 0.0–0.2)
BASO%: 0.3 % (ref 0.0–2.0)
EOS%: 1.5 % (ref 0.0–7.0)
Eosinophils Absolute: 0.1 10*3/uL (ref 0.0–0.5)
HEMATOCRIT: 38.8 % (ref 34.8–46.6)
HEMOGLOBIN: 12.8 g/dL (ref 11.6–15.9)
LYMPH#: 1.4 10*3/uL (ref 0.9–3.3)
LYMPH%: 41 % (ref 14.0–48.0)
MCH: 29.1 pg (ref 26.0–34.0)
MCHC: 33 g/dL (ref 32.0–36.0)
MCV: 88 fL (ref 81–101)
MONO#: 0.4 10*3/uL (ref 0.1–0.9)
MONO%: 11.3 % (ref 0.0–13.0)
NEUT%: 45.9 % (ref 39.6–80.0)
NEUTROS ABS: 1.6 10*3/uL (ref 1.5–6.5)
Platelets: 281 10*3/uL (ref 145–400)
RBC: 4.4 10*6/uL (ref 3.70–5.32)
RDW: 14 % (ref 11.1–15.7)
WBC: 3.4 10*3/uL — AB (ref 3.9–10.0)

## 2017-01-07 NOTE — Progress Notes (Signed)
Hematology and Oncology Follow Up Visit  Jamie Mcdowell [id number] [birth year] 56 y.o. 01/07/2017   Principle Diagnosis:  Recurrent iron deficiency anemia  Current Therapy:   IV iron as indicated - last received in December 2017    Interim History:  Jamie Mcdowell is here today for a follow-up. She is doing better. She enjoyed the holidays. She did well with the holidays. She get iron back in December. She recently had an upper GI with small bowel follow-through today. She is not sure of the results. She is next due for a capsule endoscopy. This might be the next week or so.   She last received iron back in December. At that time, her ferritin was 199 with iron saturation of 15%. Iron was makes her feel better. She will talk with her gastroenterologist to see about the capsule endoscopy.   She is watching what she eats very closely.   She has not had any problems with nausea or vomiting. She's had no change in bowel or bladder habits. She's had no rashes. She's had no leg swelling.    Overall, her performance status is ECOG 0.  Medications:  Allergies as of 01/07/2017      Reactions   Ciprocinonide [fluocinolone]    Penicillins    Sulfa Antibiotics       Medication List       Accurate as of 01/07/17  1:15 PM. Always use your most recent med list.          cloNIDine 0.2 MG tablet Commonly known as:  CATAPRES Take 1 tablet (0.2 mg total) by mouth 2 (two) times daily.   omeprazole 10 MG capsule Commonly known as:  PRILOSEC Take 20 mg by mouth daily.   spironolactone 25 MG tablet Commonly known as:  ALDACTONE Reported on 04/20/2016       Allergies:  Allergies  Allergen Reactions  . Ciprocinonide [Fluocinolone]   . Penicillins   . Sulfa Antibiotics     Past Medical History, Surgical history, Social history, and Family History were reviewed and updated.  Review of Systems: All other 10 point review of systems is negative.   Physical Exam:  weight is 178 lb 8 oz (81  kg). Her oral temperature is 98.3 F (36.8 C). Her blood pressure is 155/93 (abnormal) and her pulse is 67.   Wt Readings from Last 3 Encounters:  01/07/17 178 lb 8 oz (81 kg)  11/29/16 180 lb (81.6 kg)  10/26/16 181 lb (82.1 kg)    Well-developed well-nourished white female in no obvious distress. Head exam shows no ocular or oral lesions. There are no palpable cervical or supraclavicular lymph nodes. Lungs are clear. Cardiac exam regular rate and rhythm with no murmurs, rubs or bruits. Abdomen is soft and she has good bowel sounds. There is no fluid wave. Her bowel sounds IV little but hyperactive secondary to the upper GI today. Extremities shows no clubbing, cyanosis or edema. Neurological exam shows no focal neurological deficits.  Lab Results  Component Value Date   WBC 3.4 (L) 01/07/2017   HGB 12.8 01/07/2017   HCT 38.8 01/07/2017   MCV 88 01/07/2017   PLT 281 01/07/2017   Lab Results  Component Value Date   FERRITIN 199 11/29/2016   IRON 40 (L) 11/29/2016   TIBC 265 11/29/2016   UIBC 225 11/29/2016   IRONPCTSAT 15 (L) 11/29/2016   Lab Results  Component Value Date   RETICCTPCT 2.9 (H) 12/12/2015   RBC 4.40 01/07/2017  RETICCTABS 139.2 12/12/2015   No results found for: KPAFRELGTCHN, LAMBDASER, KAPLAMBRATIO No results found for: Kandis Cocking, IGMSERUM Lab Results  Component Value Date   TOTALPROTELP 6.8 02/28/2012   ALBUMINELP 57.8 02/28/2012   A1GS 4.0 02/28/2012   A2GS 11.7 02/28/2012   BETS 6.7 02/28/2012   BETA2SER 5.2 02/28/2012   GAMS 14.6 02/28/2012   MSPIKE NOT DET 02/28/2012   SPEI * 02/28/2012     Chemistry      Component Value Date/Time   NA 143 11/29/2016 1348   K 4.1 11/29/2016 1348   CL 104 06/20/2016 2105   CL 103 03/09/2016 1507   CL 105 09/02/2012 0859   CO2 24 11/29/2016 1348   BUN 17.3 11/29/2016 1348   CREATININE 0.8 11/29/2016 1348      Component Value Date/Time   CALCIUM 9.6 11/29/2016 1348   ALKPHOS 90 11/29/2016 1348   AST  15 11/29/2016 1348   ALT 14 11/29/2016 1348   BILITOT 0.38 11/29/2016 1348     Impression and Plan: Jamie Mcdowell is a 56 yo white female with recurrent iron deficiency anemia.   I looked at her blood smear today. Her red cells looked a little bit larger. I did not see much microcytic changes.  We will have to see what her iron studies show. Hopefully, she will not need any iron.  I will like to get her back in another 6 weeks.   It will be interesting to see what the capsule endoscopy shows.   I think that part of her problems that she just does not absorb iron orally. She has a hiatal hernia. She is on Prilosec.   I will make sure that she takes some vitamin C. This might help a little bit   Volanda Napoleon, MD 1/15/20181:15 PM

## 2017-01-08 ENCOUNTER — Telehealth: Payer: Self-pay | Admitting: *Deleted

## 2017-01-08 LAB — RETICULOCYTES: RETICULOCYTE COUNT: 2.2 % (ref 0.6–2.6)

## 2017-01-08 LAB — FERRITIN: Ferritin: 314 ng/ml — ABNORMAL HIGH (ref 9–269)

## 2017-01-08 LAB — IRON AND TIBC
%SAT: 15 % — ABNORMAL LOW (ref 21–57)
Iron: 38 ug/dL — ABNORMAL LOW (ref 41–142)
TIBC: 254 ug/dL (ref 236–444)
UIBC: 216 ug/dL (ref 120–384)

## 2017-01-08 NOTE — Telephone Encounter (Addendum)
Patient aware of results. Appointments scheduled.   ----- Message from [name], NP sent at 01/08/2017  1:43 PM EST ----- Regarding: iron  Iron still low. Needs 2 doses of IV iron. Thank you!  Sarah  ----- Message ----- From: Interface, Lab In Three Zero One Sent: 01/07/2017  12:03 PM To: [name], NP

## 2017-01-10 ENCOUNTER — Ambulatory Visit (HOSPITAL_BASED_OUTPATIENT_CLINIC_OR_DEPARTMENT_OTHER): Payer: BLUE CROSS/BLUE SHIELD

## 2017-01-10 VITALS — BP 127/65 | HR 62 | Temp 97.9°F | Resp 17

## 2017-01-10 DIAGNOSIS — D508 Other iron deficiency anemias: Secondary | ICD-10-CM

## 2017-01-10 DIAGNOSIS — D500 Iron deficiency anemia secondary to blood loss (chronic): Secondary | ICD-10-CM

## 2017-01-10 DIAGNOSIS — D509 Iron deficiency anemia, unspecified: Secondary | ICD-10-CM | POA: Diagnosis not present

## 2017-01-10 MED ORDER — FERUMOXYTOL INJECTION 510 MG/17 ML
510.0000 mg | Freq: Once | INTRAVENOUS | Status: AC
  Administered 2017-01-10: 510 mg via INTRAVENOUS
  Filled 2017-01-10: qty 17

## 2017-01-10 MED ORDER — PROCHLORPERAZINE MALEATE 10 MG PO TABS
ORAL_TABLET | ORAL | Status: AC
  Filled 2017-01-10: qty 1

## 2017-01-10 MED ORDER — PROCHLORPERAZINE MALEATE 10 MG PO TABS
10.0000 mg | ORAL_TABLET | Freq: Once | ORAL | Status: AC
  Administered 2017-01-10: 10 mg via ORAL

## 2017-01-10 NOTE — Patient Instructions (Signed)
Ferumoxytol injection What is this medicine? FERUMOXYTOL is an iron complex. Iron is used to make healthy red blood cells, which carry oxygen and nutrients throughout the body. This medicine is used to treat iron deficiency anemia in people with chronic kidney disease. COMMON BRAND NAME(S): Feraheme What should I tell my health care provider before I take this medicine? They need to know if you have any of these conditions: -anemia not caused by low iron levels -high levels of iron in the blood -magnetic resonance imaging (MRI) test scheduled -an unusual or allergic reaction to iron, other medicines, foods, dyes, or preservatives -pregnant or trying to get pregnant -breast-feeding How should I use this medicine? This medicine is for injection into a vein. It is given by a health care professional in a hospital or clinic setting. Talk to your pediatrician regarding the use of this medicine in children. Special care may be needed. What if I miss a dose? It is important not to miss your dose. Call your doctor or health care professional if you are unable to keep an appointment. What may interact with this medicine? This medicine may interact with the following medications: -other iron products What should I watch for while using this medicine? Visit your doctor or healthcare professional regularly. Tell your doctor or healthcare professional if your symptoms do not start to get better or if they get worse. You may need blood work done while you are taking this medicine. You may need to follow a special diet. Talk to your doctor. Foods that contain iron include: whole grains/cereals, dried fruits, beans, or peas, leafy green vegetables, and organ meats (liver, kidney). What side effects may I notice from receiving this medicine? Side effects that you should report to your doctor or health care professional as soon as possible: -allergic reactions like skin rash, itching or hives, swelling of the  face, lips, or tongue -breathing problems -changes in blood pressure -feeling faint or lightheaded, falls -fever or chills -flushing, sweating, or hot feelings -swelling of the ankles or feet Side effects that usually do not require medical attention (report to your doctor or health care professional if they continue or are bothersome): -diarrhea -headache -nausea, vomiting -stomach pain Where should I keep my medicine? This drug is given in a hospital or clinic and will not be stored at home.  2017 Elsevier/Gold Standard (2016-01-12 12:41:49)  

## 2017-01-17 ENCOUNTER — Other Ambulatory Visit: Payer: Self-pay | Admitting: Family

## 2017-01-17 ENCOUNTER — Ambulatory Visit (HOSPITAL_BASED_OUTPATIENT_CLINIC_OR_DEPARTMENT_OTHER): Payer: BLUE CROSS/BLUE SHIELD

## 2017-01-17 VITALS — BP 133/81 | HR 74 | Temp 97.5°F | Resp 17

## 2017-01-17 DIAGNOSIS — R110 Nausea: Secondary | ICD-10-CM

## 2017-01-17 DIAGNOSIS — R112 Nausea with vomiting, unspecified: Secondary | ICD-10-CM

## 2017-01-17 DIAGNOSIS — D509 Iron deficiency anemia, unspecified: Secondary | ICD-10-CM | POA: Diagnosis not present

## 2017-01-17 DIAGNOSIS — D500 Iron deficiency anemia secondary to blood loss (chronic): Secondary | ICD-10-CM

## 2017-01-17 MED ORDER — SODIUM CHLORIDE 0.9 % IV SOLN: Freq: Once | INTRAVENOUS | Status: DC

## 2017-01-17 MED ORDER — FERUMOXYTOL INJECTION 510 MG/17 ML
510.0000 mg | Freq: Once | INTRAVENOUS | Status: AC
  Administered 2017-01-17: 510 mg via INTRAVENOUS
  Filled 2017-01-17: qty 17

## 2017-01-17 MED ORDER — ONDANSETRON HCL 4 MG/2ML IJ SOLN
INTRAMUSCULAR | Status: AC
  Filled 2017-01-17: qty 4

## 2017-01-17 MED ORDER — ONDANSETRON HCL 4 MG PO TABS: 4.0000 mg | ORAL_TABLET | Freq: Two times a day (BID) | ORAL | 0 refills | Status: DC

## 2017-01-17 MED ORDER — SODIUM CHLORIDE 0.9 % IV SOLN: 8.0000 mg | Freq: Once | INTRAVENOUS | Status: DC

## 2017-01-17 MED ORDER — ONDANSETRON HCL 40 MG/20ML IJ SOLN
8.0000 mg | Freq: Once | INTRAMUSCULAR | Status: AC
  Administered 2017-01-17: 8 mg via INTRAVENOUS
  Filled 2017-01-17: qty 4

## 2017-01-17 NOTE — Patient Instructions (Signed)
Ferumoxytol injection What is this medicine? FERUMOXYTOL is an iron complex. Iron is used to make healthy red blood cells, which carry oxygen and nutrients throughout the body. This medicine is used to treat iron deficiency anemia in people with chronic kidney disease. COMMON BRAND NAME(S): Feraheme What should I tell my health care provider before I take this medicine? They need to know if you have any of these conditions: -anemia not caused by low iron levels -high levels of iron in the blood -magnetic resonance imaging (MRI) test scheduled -an unusual or allergic reaction to iron, other medicines, foods, dyes, or preservatives -pregnant or trying to get pregnant -breast-feeding How should I use this medicine? This medicine is for injection into a vein. It is given by a health care professional in a hospital or clinic setting. Talk to your pediatrician regarding the use of this medicine in children. Special care may be needed. What if I miss a dose? It is important not to miss your dose. Call your doctor or health care professional if you are unable to keep an appointment. What may interact with this medicine? This medicine may interact with the following medications: -other iron products What should I watch for while using this medicine? Visit your doctor or healthcare professional regularly. Tell your doctor or healthcare professional if your symptoms do not start to get better or if they get worse. You may need blood work done while you are taking this medicine. You may need to follow a special diet. Talk to your doctor. Foods that contain iron include: whole grains/cereals, dried fruits, beans, or peas, leafy green vegetables, and organ meats (liver, kidney). What side effects may I notice from receiving this medicine? Side effects that you should report to your doctor or health care professional as soon as possible: -allergic reactions like skin rash, itching or hives, swelling of the  face, lips, or tongue -breathing problems -changes in blood pressure -feeling faint or lightheaded, falls -fever or chills -flushing, sweating, or hot feelings -swelling of the ankles or feet Side effects that usually do not require medical attention (report to your doctor or health care professional if they continue or are bothersome): -diarrhea -headache -nausea, vomiting -stomach pain Where should I keep my medicine? This drug is given in a hospital or clinic and will not be stored at home.  2017 Elsevier/Gold Standard (2016-01-12 12:41:49)  

## 2017-01-17 NOTE — Progress Notes (Signed)
Sarah NP aware of nausea and decreased food intake. Medication given here and script to go home with too.

## 2017-01-23 ENCOUNTER — Telehealth: Payer: Self-pay | Admitting: *Deleted

## 2017-01-23 NOTE — Telephone Encounter (Signed)
Patient c/o constipation. She is using Zofran prn. She has tried colace for her constipation without relief.   Spoke with [name] NP and she wants patient to take Miralax BID until relief.   Spoke with patient. She is aware of instructions. She will pick up med today. Confirmed teaching with teach back.

## 2017-02-27 ENCOUNTER — Ambulatory Visit
Admission: RE | Admit: 2017-02-27 | Discharge: 2017-02-27 | Disposition: A | Discharge status: Home / Self Care | Payer: BLUE CROSS/BLUE SHIELD | Source: Ambulatory Visit | Attending: Gastroenterology | Admitting: Gastroenterology

## 2017-02-27 ENCOUNTER — Other Ambulatory Visit: Payer: Self-pay | Admitting: Gastroenterology

## 2017-02-27 DIAGNOSIS — R109 Unspecified abdominal pain: Secondary | ICD-10-CM

## 2017-03-01 ENCOUNTER — Other Ambulatory Visit (HOSPITAL_BASED_OUTPATIENT_CLINIC_OR_DEPARTMENT_OTHER): Payer: BLUE CROSS/BLUE SHIELD

## 2017-03-01 ENCOUNTER — Ambulatory Visit (HOSPITAL_BASED_OUTPATIENT_CLINIC_OR_DEPARTMENT_OTHER): Payer: BLUE CROSS/BLUE SHIELD | Admitting: Family

## 2017-03-01 ENCOUNTER — Ambulatory Visit (HOSPITAL_BASED_OUTPATIENT_CLINIC_OR_DEPARTMENT_OTHER): Payer: BLUE CROSS/BLUE SHIELD

## 2017-03-01 VITALS — BP 124/90 | HR 64

## 2017-03-01 VITALS — BP 134/82 | HR 69 | Temp 97.8°F | Wt 181.8 lb

## 2017-03-01 DIAGNOSIS — D500 Iron deficiency anemia secondary to blood loss (chronic): Secondary | ICD-10-CM

## 2017-03-01 DIAGNOSIS — D509 Iron deficiency anemia, unspecified: Secondary | ICD-10-CM | POA: Diagnosis not present

## 2017-03-01 LAB — CBC WITH DIFFERENTIAL (CANCER CENTER ONLY)
BASO#: 0 10*3/uL (ref 0.0–0.2)
BASO%: 1.1 % (ref 0.0–2.0)
EOS ABS: 0.1 10*3/uL (ref 0.0–0.5)
EOS%: 2.4 % (ref 0.0–7.0)
HEMATOCRIT: 34.7 % — AB (ref 34.8–46.6)
HEMOGLOBIN: 10.9 g/dL — AB (ref 11.6–15.9)
LYMPH#: 1.4 10*3/uL (ref 0.9–3.3)
LYMPH%: 38.4 % (ref 14.0–48.0)
MCH: 28.7 pg (ref 26.0–34.0)
MCHC: 31.4 g/dL — ABNORMAL LOW (ref 32.0–36.0)
MCV: 91 fL (ref 81–101)
MONO#: 0.4 10*3/uL (ref 0.1–0.9)
MONO%: 10 % (ref 0.0–13.0)
NEUT%: 48.1 % (ref 39.6–80.0)
NEUTROS ABS: 1.8 10*3/uL (ref 1.5–6.5)
Platelets: 353 10*3/uL (ref 145–400)
RBC: 3.8 10*6/uL (ref 3.70–5.32)
RDW: 15.2 % (ref 11.1–15.7)
WBC: 3.7 10*3/uL — AB (ref 3.9–10.0)

## 2017-03-01 LAB — COMPREHENSIVE METABOLIC PANEL
ALBUMIN: 3.8 g/dL (ref 3.5–5.0)
ALK PHOS: 88 U/L (ref 40–150)
ALT: 13 U/L (ref 0–55)
AST: 15 U/L (ref 5–34)
Anion Gap: 7 mEq/L (ref 3–11)
BUN: 12.8 mg/dL (ref 7.0–26.0)
CALCIUM: 9.6 mg/dL (ref 8.4–10.4)
CO2: 26 mEq/L (ref 22–29)
CREATININE: 0.7 mg/dL (ref 0.6–1.1)
Chloride: 107 mEq/L (ref 98–109)
EGFR: >90 mL/min/{1.73_m2} (ref >90)
Glucose: 89 mg/dl (ref 70–140)
Potassium: 4.3 mEq/L (ref 3.5–5.1)
Sodium: 140 mEq/L (ref 136–145)
Total Bilirubin: 0.32 mg/dL (ref 0.20–1.20)
Total Protein: 6.8 g/dL (ref 6.4–8.3)

## 2017-03-01 MED ORDER — SODIUM CHLORIDE 0.9 % IV SOLN
510.0000 mg | Freq: Once | INTRAVENOUS | Status: AC
  Administered 2017-03-01: 510 mg via INTRAVENOUS
  Filled 2017-03-01: qty 17

## 2017-03-01 NOTE — Patient Instructions (Signed)
Ferumoxytol injection What is this medicine? FERUMOXYTOL is an iron complex. Iron is used to make healthy red blood cells, which carry oxygen and nutrients throughout the body. This medicine is used to treat iron deficiency anemia in people with chronic kidney disease. COMMON BRAND NAME(S): Feraheme What should I tell my health care provider before I take this medicine? They need to know if you have any of these conditions: -anemia not caused by low iron levels -high levels of iron in the blood -magnetic resonance imaging (MRI) test scheduled -an unusual or allergic reaction to iron, other medicines, foods, dyes, or preservatives -pregnant or trying to get pregnant -breast-feeding How should I use this medicine? This medicine is for injection into a vein. It is given by a health care professional in a hospital or clinic setting. Talk to your pediatrician regarding the use of this medicine in children. Special care may be needed. What if I miss a dose? It is important not to miss your dose. Call your doctor or health care professional if you are unable to keep an appointment. What may interact with this medicine? This medicine may interact with the following medications: -other iron products What should I watch for while using this medicine? Visit your doctor or healthcare professional regularly. Tell your doctor or healthcare professional if your symptoms do not start to get better or if they get worse. You may need blood work done while you are taking this medicine. You may need to follow a special diet. Talk to your doctor. Foods that contain iron include: whole grains/cereals, dried fruits, beans, or peas, leafy green vegetables, and organ meats (liver, kidney). What side effects may I notice from receiving this medicine? Side effects that you should report to your doctor or health care professional as soon as possible: -allergic reactions like skin rash, itching or hives, swelling of the  face, lips, or tongue -breathing problems -changes in blood pressure -feeling faint or lightheaded, falls -fever or chills -flushing, sweating, or hot feelings -swelling of the ankles or feet Side effects that usually do not require medical attention (report to your doctor or health care professional if they continue or are bothersome): -diarrhea -headache -nausea, vomiting -stomach pain Where should I keep my medicine? This drug is given in a hospital or clinic and will not be stored at home.  2017 Elsevier/Gold Standard (2016-01-12 12:41:49)  

## 2017-03-01 NOTE — Progress Notes (Signed)
Hematology and Oncology Follow Up Visit  Jamie Mcdowell [id number] 1961-01-09 56 y.o. 03/01/2017   Principle Diagnosis:  Recurrent iron deficiency anemia  Current Therapy:   IV iron as indicated - last received in January 2018 x 2    Interim History:  Jamie Mcdowell is here today for a follow-up. She is tearful and states that she is feeling very fatigued and weak. She states that she can tell her iron is low and that she has had some dark tarry stools. She also c/o palpitations, SOB with exertion, dizziness, feeling "off balance" and thirst.  Hgb is down at 10.9 with an MCV of 91. Platelets are 353.  She swallowed the camera pill last week and is waiting to hear the results from Dr. Erlinda Hong office.  No fever, chills, n/v, cough, rash, chest pain, abdominal pain or changes in bladder habits.  She has IBS and fluctuates between constipation and diarrhea.  No swelling, tenderness, numbness or tingling. No c/o joint aches or pain.  Her appetite is still down but a bit better. She is no longer feeling nauseated. No vomiting. She is staying hydrated and her weight is stable.   Medications:  Allergies as of 03/01/2017      Reactions   Ciprocinonide [fluocinolone]    Penicillins    Sulfa Antibiotics       Medication List       Accurate as of 03/01/17 12:17 PM. Always use your most recent med list.          cloNIDine 0.2 MG tablet Commonly known as:  CATAPRES Take 1 tablet (0.2 mg total) by mouth 2 (two) times daily.   omeprazole 10 MG capsule Commonly known as:  PRILOSEC Take 20 mg by mouth daily.   ondansetron 4 MG tablet Commonly known as:  ZOFRAN Take 1 tablet (4 mg total) by mouth 2 (two) times daily.   spironolactone 25 MG tablet Commonly known as:  ALDACTONE Reported on 04/20/2016       Allergies:  Allergies  Allergen Reactions  . Ciprocinonide [Fluocinolone]   . Penicillins   . Sulfa Antibiotics     Past Medical History, Surgical history, Social history, and Family  History were reviewed and updated.  Review of Systems: All other 10 point review of systems is negative.   Physical Exam:  weight is 181 lb 12.8 oz (82.5 kg). Her oral temperature is 97.8 F (36.6 C). Her blood pressure is 134/82 and her pulse is 69.   Wt Readings from Last 3 Encounters:  03/01/17 181 lb 12.8 oz (82.5 kg)  01/07/17 178 lb 8 oz (81 kg)  11/29/16 180 lb (81.6 kg)    Ocular: Sclerae unicteric, pupils equal, round and reactive to light Ear-nose-throat: Oropharynx clear, dentition fair Lymphatic: No cervical supraclavicular or axillary adenopathy Lungs no rales or rhonchi, good excursion bilaterally Heart regular rate and rhythm, no murmur appreciated Abd soft, nontender, positive bowel sounds, no liver or spleen tip palpated on exam, no fluid wave  MSK no focal spinal tenderness, no joint edema Neuro: non-focal, well-oriented, appropriate affect Breasts: Deferred  Lab Results  Component Value Date   WBC 3.7 (L) 03/01/2017   HGB 10.9 (L) 03/01/2017   HCT 34.7 (L) 03/01/2017   MCV 91 03/01/2017   PLT 353 03/01/2017   Lab Results  Component Value Date   FERRITIN 314 (H) 01/07/2017   IRON 38 (L) 01/07/2017   TIBC 254 01/07/2017   UIBC 216 01/07/2017   IRONPCTSAT 15 (L) 01/07/2017  Lab Results  Component Value Date   RETICCTPCT 2.9 (H) 12/12/2015   RBC 3.80 03/01/2017   RETICCTABS 139.2 12/12/2015   No results found for: KPAFRELGTCHN, LAMBDASER, KAPLAMBRATIO No results found for: Kandis Cocking, IGMSERUM Lab Results  Component Value Date   TOTALPROTELP 6.8 02/28/2012   ALBUMINELP 57.8 02/28/2012   A1GS 4.0 02/28/2012   A2GS 11.7 02/28/2012   BETS 6.7 02/28/2012   BETA2SER 5.2 02/28/2012   GAMS 14.6 02/28/2012   MSPIKE NOT DET 02/28/2012   SPEI * 02/28/2012     Chemistry      Component Value Date/Time   NA 143 11/29/2016 1348   K 4.1 11/29/2016 1348   CL 104 06/20/2016 2105   CL 103 03/09/2016 1507   CL 105 09/02/2012 0859   CO2 24  11/29/2016 1348   BUN 17.3 11/29/2016 1348   CREATININE 0.8 11/29/2016 1348      Component Value Date/Time   CALCIUM 9.6 11/29/2016 1348   ALKPHOS 90 11/29/2016 1348   AST 15 11/29/2016 1348   ALT 14 11/29/2016 1348   BILITOT 0.38 11/29/2016 1348     Impression and Plan: Jamie Mcdowell is a 56 yo white female with recurrent iron deficiency anemia. She is having dark tarry stools and is quite symptomatic, as mentioned above, with anemia. She has had a dip in her Hgb to 10.9.  We are waiting on the results of her camera pill study with Dr. Paulita Fujita.  We will proceed with an iron infusion today. We will see what her iron studies show and bring her back in next week for another infusion if needed.  We will go ahead and plan to see her back in 1 month for repeat lab work and follow-up. She will contact us with any questions or concerns. We can certainly see her sooner if need be.   [name], NP 3/9/201812:17 PM

## 2017-03-02 LAB — RETICULOCYTES: RETICULOCYTE COUNT: 5.6 % — AB (ref 0.6–2.6)

## 2017-03-04 LAB — FERRITIN: Ferritin: 58 ng/ml (ref 9–269)

## 2017-03-04 LAB — IRON AND TIBC
%SAT: 12 % — AB (ref 21–57)
IRON: 33 ug/dL — AB (ref 41–142)
TIBC: 276 ug/dL (ref 236–444)
UIBC: 243 ug/dL (ref 120–384)

## 2017-03-05 ENCOUNTER — Telehealth: Payer: Self-pay | Admitting: *Deleted

## 2017-03-05 NOTE — Telephone Encounter (Addendum)
Patient aware of results. Appointment made  ----- Message from [name], NP sent at 03/04/2017  2:16 PM EDT ----- Regarding: iron  She will need a second dose of IV iron please. Counts are improving. Thank you!  Sarah  ----- Message ----- From: Volanda Napoleon, MD Sent: 03/04/2017  10:08 AM To: [name], NP    ----- Message ----- From: Interface, Lab In Three Zero One Sent: 03/01/2017  12:06 PM To: Volanda Napoleon, MD

## 2017-03-07 ENCOUNTER — Ambulatory Visit (HOSPITAL_BASED_OUTPATIENT_CLINIC_OR_DEPARTMENT_OTHER): Payer: BLUE CROSS/BLUE SHIELD

## 2017-03-07 ENCOUNTER — Other Ambulatory Visit: Payer: Self-pay | Admitting: Family

## 2017-03-07 VITALS — BP 157/88 | HR 67 | Temp 97.7°F | Resp 16

## 2017-03-07 DIAGNOSIS — D509 Iron deficiency anemia, unspecified: Secondary | ICD-10-CM | POA: Diagnosis not present

## 2017-03-07 DIAGNOSIS — D500 Iron deficiency anemia secondary to blood loss (chronic): Secondary | ICD-10-CM

## 2017-03-07 MED ORDER — SODIUM CHLORIDE 0.9 % IV SOLN
510.0000 mg | Freq: Once | INTRAVENOUS | Status: AC
  Administered 2017-03-07: 510 mg via INTRAVENOUS
  Filled 2017-03-07: qty 17

## 2017-03-07 NOTE — Patient Instructions (Signed)

## 2017-04-05 ENCOUNTER — Ambulatory Visit (HOSPITAL_BASED_OUTPATIENT_CLINIC_OR_DEPARTMENT_OTHER): Payer: BLUE CROSS/BLUE SHIELD | Admitting: Hematology & Oncology

## 2017-04-05 ENCOUNTER — Other Ambulatory Visit (HOSPITAL_BASED_OUTPATIENT_CLINIC_OR_DEPARTMENT_OTHER): Payer: BLUE CROSS/BLUE SHIELD

## 2017-04-05 VITALS — BP 160/100 | HR 68 | Temp 97.9°F | Resp 18 | Wt 175.8 lb

## 2017-04-05 DIAGNOSIS — D509 Iron deficiency anemia, unspecified: Secondary | ICD-10-CM | POA: Diagnosis not present

## 2017-04-05 DIAGNOSIS — D500 Iron deficiency anemia secondary to blood loss (chronic): Secondary | ICD-10-CM

## 2017-04-05 DIAGNOSIS — D510 Vitamin B12 deficiency anemia due to intrinsic factor deficiency: Secondary | ICD-10-CM

## 2017-04-05 DIAGNOSIS — R5382 Chronic fatigue, unspecified: Secondary | ICD-10-CM

## 2017-04-05 LAB — COMPREHENSIVE METABOLIC PANEL
ALK PHOS: 86 U/L (ref 40–150)
ALT: 14 U/L (ref 0–55)
ANION GAP: 7 meq/L (ref 3–11)
AST: 15 U/L (ref 5–34)
Albumin: 4 g/dL (ref 3.5–5.0)
BILIRUBIN TOTAL: 0.28 mg/dL (ref 0.20–1.20)
BUN: 12 mg/dL (ref 7.0–26.0)
CHLORIDE: 107 meq/L (ref 98–109)
CO2: 27 meq/L (ref 22–29)
CREATININE: 0.7 mg/dL (ref 0.6–1.1)
Calcium: 9.7 mg/dL (ref 8.4–10.4)
EGFR: >90 mL/min/{1.73_m2} (ref >90)
Glucose: 85 mg/dl (ref 70–140)
Potassium: 4 mEq/L (ref 3.5–5.1)
Sodium: 141 mEq/L (ref 136–145)
TOTAL PROTEIN: 7.2 g/dL (ref 6.4–8.3)

## 2017-04-05 LAB — CBC WITH DIFFERENTIAL (CANCER CENTER ONLY)
BASO#: 0 10*3/uL (ref 0.0–0.2)
BASO%: 0.7 % (ref 0.0–2.0)
EOS%: 1.7 % (ref 0.0–7.0)
Eosinophils Absolute: 0.1 10*3/uL (ref 0.0–0.5)
HEMATOCRIT: 42.5 % (ref 34.8–46.6)
HGB: 13.7 g/dL (ref 11.6–15.9)
LYMPH#: 1.6 10*3/uL (ref 0.9–3.3)
LYMPH%: 37.9 % (ref 14.0–48.0)
MCH: 28.7 pg (ref 26.0–34.0)
MCHC: 32.2 g/dL (ref 32.0–36.0)
MCV: 89 fL (ref 81–101)
MONO#: 0.4 10*3/uL (ref 0.1–0.9)
MONO%: 8.6 % (ref 0.0–13.0)
NEUT#: 2.1 10*3/uL (ref 1.5–6.5)
NEUT%: 51.1 % (ref 39.6–80.0)
PLATELETS: 263 10*3/uL (ref 145–400)
RBC: 4.78 10*6/uL (ref 3.70–5.32)
RDW: 14.5 % (ref 11.1–15.7)
WBC: 4.2 10*3/uL (ref 3.9–10.0)

## 2017-04-05 LAB — IRON AND TIBC
%SAT: 15 % — ABNORMAL LOW (ref 21–57)
IRON: 43 ug/dL (ref 41–142)
TIBC: 280 ug/dL (ref 236–444)
UIBC: 237 ug/dL (ref 120–384)

## 2017-04-05 LAB — FERRITIN: FERRITIN: 193 ng/mL (ref 9–269)

## 2017-04-06 NOTE — Progress Notes (Signed)
Hematology and Oncology Follow Up Visit  Jamie Mcdowell [id number] 09/04/1961 56 y.o. 04/06/2017   Principle Diagnosis:  Recurrent iron deficiency anemia  Current Therapy:   IV iron as indicated - last received in March 2018 x 2    Interim History:  Jamie Mcdowell is here today for a follow-up.   She still feels a little tired.  She had a pill endoscopy. This is with the PillCam. She says that the PillCam did not show any obvious bleeding. She has had multiple episodes of heme positive stools. As such, I have to believe that she has areas of AV malformation or angiodysplasia that are leading to bleeding and iron deficiency.  She says that the iron that she receives now does not make her feel as good.  She last iron back in March. At that time, her ferritin was 58 with iron saturation of 12%.  I don't know if we have to change iron preparations on her. I think that she has had a reaction to iron and dextran.   Her liver studies have been doing pretty well.  She has had no problem with eating. She has had problems with her blood pressure. Her blood pressure has typically been on the high side. Thankfully, there is no renal issues from her hypertension.  She has had no obvious melena or bright red blood per rectum recently.  She's had no fever. She's had no cough or shortness of breath.  Overall, her performance status is ECOG 0.   Medications:  Allergies as of 04/05/2017      Reactions   Ciprocinonide [fluocinolone]    Penicillins    Sulfa Antibiotics       Medication List       Accurate as of 04/05/17 11:59 PM. Always use your most recent med list.          cloNIDine 0.2 MG tablet Commonly known as:  CATAPRES Take 1 tablet (0.2 mg total) by mouth 2 (two) times daily.   omeprazole 10 MG capsule Commonly known as:  PRILOSEC Take 20 mg by mouth daily.   ondansetron 4 MG tablet Commonly known as:  ZOFRAN Take 1 tablet (4 mg total) by mouth 2 (two) times daily.     spironolactone 25 MG tablet Commonly known as:  ALDACTONE Reported on 04/20/2016       Allergies:  Allergies  Allergen Reactions  . Ciprocinonide [Fluocinolone]   . Penicillins   . Sulfa Antibiotics     Past Medical History, Surgical history, Social history, and Family History were reviewed and updated.  Review of Systems: All other 10 point review of systems is negative.   Physical Exam:  weight is 175 lb 12.8 oz (79.7 kg). Her oral temperature is 97.9 F (36.6 C). Her blood pressure is 160/100 (abnormal) and her pulse is 68. Her respiration is 18 and oxygen saturation is 100%.   Wt Readings from Last 3 Encounters:  04/05/17 175 lb 12.8 oz (79.7 kg)  03/01/17 181 lb 12.8 oz (82.5 kg)  01/07/17 178 lb 8 oz (81 kg)    Well-developed and well-nourished white female in no obvious distress. Head and neck exam shows no ocular or oral lesions. She has no conjunctival pallor. She has no adenopathy in her neck. Her thyroid is nonpalpable. Lungs are clear bilaterally. Cardiac exam regular rate and rhythm with no murmurs, rubs or bruits. Abdomen is soft. She is good bowel sounds. There is no fluid wave. There is no palpable liver or  spleen tip. Back exam shows no tenderness over the spine, ribs or hips. Extremities shows no clubbing, cyanosis or edema. Neurological exam shows no focal neurological deficits. Skin exam shows no rashes, ecchymoses or petechia.  Lab Results  Component Value Date   WBC 4.2 04/05/2017   HGB 13.7 04/05/2017   HCT 42.5 04/05/2017   MCV 89 04/05/2017   PLT 263 04/05/2017   Lab Results  Component Value Date   FERRITIN 193 04/05/2017   IRON 43 04/05/2017   TIBC 280 04/05/2017   UIBC 237 04/05/2017   IRONPCTSAT 15 (L) 04/05/2017   Lab Results  Component Value Date   RETICCTPCT 2.9 (H) 12/12/2015   RBC 4.78 04/05/2017   RETICCTABS 139.2 12/12/2015   No results found for: KPAFRELGTCHN, LAMBDASER, KAPLAMBRATIO No results found for: Osborne Casco Lab Results  Component Value Date   TOTALPROTELP 6.8 02/28/2012   ALBUMINELP 57.8 02/28/2012   A1GS 4.0 02/28/2012   A2GS 11.7 02/28/2012   BETS 6.7 02/28/2012   BETA2SER 5.2 02/28/2012   GAMS 14.6 02/28/2012   MSPIKE NOT DET 02/28/2012   SPEI * 02/28/2012     Chemistry      Component Value Date/Time   NA 141 04/05/2017 1114   K 4.0 04/05/2017 1114   CL 104 06/20/2016 2105   CL 103 03/09/2016 1507   CL 105 09/02/2012 0859   CO2 27 04/05/2017 1114   BUN 12.0 04/05/2017 1114   CREATININE 0.7 04/05/2017 1114      Component Value Date/Time   CALCIUM 9.7 04/05/2017 1114   ALKPHOS 86 04/05/2017 1114   AST 15 04/05/2017 1114   ALT 14 04/05/2017 1114   BILITOT 0.28 04/05/2017 1114     Impression and Plan: Jamie Mcdowell is a 56 yo white female with recurrent iron deficiency anemia.   Despite the fact that her GI workup is negative, I selectively that she is having GI bleeding. She may have angiodysplasia or AV malformations that just are not being picked up. She has had melena in the past.   Her iron studies show ferritin of 193. Her iron saturation was 15%. I probably would hold off on iron for right now given her hemoglobin and MCV.  Some of her tiredness and fatigue might be from her high blood pressure. She has had problems with this. She is on medication for this. She will see her family doctor to have this monitored.  We'll have to watch her closely. I will like to see her back in 6 weeks.   I spent about 25 minutes with she and one of her daughters.   Jamie Napoleon, MD 4/14/201811:38 AM

## 2017-04-08 LAB — RETICULOCYTES: RETICULOCYTE COUNT: 1.8 % (ref 0.6–2.6)

## 2017-04-12 ENCOUNTER — Ambulatory Visit (HOSPITAL_BASED_OUTPATIENT_CLINIC_OR_DEPARTMENT_OTHER): Payer: BLUE CROSS/BLUE SHIELD

## 2017-04-12 VITALS — BP 134/84 | HR 59 | Temp 98.1°F | Resp 18

## 2017-04-12 DIAGNOSIS — D509 Iron deficiency anemia, unspecified: Secondary | ICD-10-CM | POA: Diagnosis not present

## 2017-04-12 DIAGNOSIS — D500 Iron deficiency anemia secondary to blood loss (chronic): Secondary | ICD-10-CM

## 2017-04-12 MED ORDER — FERUMOXYTOL INJECTION 510 MG/17 ML
510.0000 mg | Freq: Once | INTRAVENOUS | Status: AC
  Administered 2017-04-12: 510 mg via INTRAVENOUS
  Filled 2017-04-12: qty 17

## 2017-04-12 NOTE — Patient Instructions (Signed)

## 2017-05-17 ENCOUNTER — Other Ambulatory Visit (HOSPITAL_BASED_OUTPATIENT_CLINIC_OR_DEPARTMENT_OTHER): Payer: BLUE CROSS/BLUE SHIELD

## 2017-05-17 ENCOUNTER — Ambulatory Visit: Payer: BLUE CROSS/BLUE SHIELD

## 2017-05-17 ENCOUNTER — Ambulatory Visit (HOSPITAL_BASED_OUTPATIENT_CLINIC_OR_DEPARTMENT_OTHER): Payer: BLUE CROSS/BLUE SHIELD | Admitting: Hematology & Oncology

## 2017-05-17 ENCOUNTER — Encounter: Payer: Self-pay | Admitting: *Deleted

## 2017-05-17 VITALS — BP 139/87 | HR 64 | Temp 98.3°F | Resp 16 | Wt 178.8 lb

## 2017-05-17 DIAGNOSIS — D509 Iron deficiency anemia, unspecified: Secondary | ICD-10-CM

## 2017-05-17 DIAGNOSIS — D510 Vitamin B12 deficiency anemia due to intrinsic factor deficiency: Secondary | ICD-10-CM

## 2017-05-17 DIAGNOSIS — R5382 Chronic fatigue, unspecified: Secondary | ICD-10-CM | POA: Diagnosis not present

## 2017-05-17 DIAGNOSIS — D500 Iron deficiency anemia secondary to blood loss (chronic): Secondary | ICD-10-CM

## 2017-05-17 LAB — TSH: TSH: 1.292 m[IU]/L (ref 0.308–3.960)

## 2017-05-17 LAB — CBC WITH DIFFERENTIAL (CANCER CENTER ONLY)
BASO#: 0 10*3/uL (ref 0.0–0.2)
BASO%: 0.5 % (ref 0.0–2.0)
EOS%: 1.7 % (ref 0.0–7.0)
Eosinophils Absolute: 0.1 10*3/uL (ref 0.0–0.5)
HEMATOCRIT: 41.7 % (ref 34.8–46.6)
HGB: 13.9 g/dL (ref 11.6–15.9)
LYMPH#: 1.5 10*3/uL (ref 0.9–3.3)
LYMPH%: 35.5 % (ref 14.0–48.0)
MCH: 29 pg (ref 26.0–34.0)
MCHC: 33.3 g/dL (ref 32.0–36.0)
MCV: 87 fL (ref 81–101)
MONO#: 0.3 10*3/uL (ref 0.1–0.9)
MONO%: 8.1 % (ref 0.0–13.0)
NEUT#: 2.2 10*3/uL (ref 1.5–6.5)
NEUT%: 54.2 % (ref 39.6–80.0)
Platelets: 255 10*3/uL (ref 145–400)
RBC: 4.8 10*6/uL (ref 3.70–5.32)
RDW: 14.6 % (ref 11.1–15.7)
WBC: 4.1 10*3/uL (ref 3.9–10.0)

## 2017-05-17 LAB — IRON AND TIBC
%SAT: 23 % (ref 21–57)
Iron: 57 ug/dL (ref 41–142)
TIBC: 251 ug/dL (ref 236–444)
UIBC: 194 ug/dL (ref 120–384)

## 2017-05-17 LAB — FERRITIN: Ferritin: 264 ng/ml (ref 9–269)

## 2017-05-17 NOTE — Progress Notes (Signed)
Hematology and Oncology Follow Up Visit  Jamie Mcdowell [id number] 24-Feb-1961 56 y.o. 05/17/2017   Principle Diagnosis:  Recurrent iron deficiency anemia  Current Therapy:   IV iron as indicated - last received in April 2018 x 2    Interim History:  [name] is here today for a follow-up. She is doing okay. She does feel a little tired.  She last received iron back in April. At that point in time, her iron saturation was 15%.  She is being followed closely by gastroenterology.  She's not had any issues with obvious bleeding. There's been no melena or bright red blood per rectum.  She's had no fever. She's had some abdominal pain. She does have some reflux. She is being followed by gastroenterology for this. She has a hiatal hernia. She is being referred to a Psychologist, sport and exercise. She is not sure that she would like to be seen by surgery.  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  She has had no problems with her appetite. Her blood pressure is doing better.  Overall, her performance status is ECOG 0.    Medications:  Allergies as of 05/17/2017      Reactions   Ciprocinonide [fluocinolone]    Penicillins    Sulfa Antibiotics       Medication List       Accurate as of 05/17/17 12:24 PM. Always use your most recent med list.          cloNIDine 0.2 MG tablet Commonly known as:  CATAPRES Take 1 tablet (0.2 mg total) by mouth 2 (two) times daily.   omeprazole 10 MG capsule Commonly known as:  PRILOSEC Take 20 mg by mouth daily.   ondansetron 4 MG tablet Commonly known as:  ZOFRAN Take 1 tablet (4 mg total) by mouth 2 (two) times daily.       Allergies:  Allergies  Allergen Reactions  . Ciprocinonide [Fluocinolone]   . Penicillins   . Sulfa Antibiotics     Past Medical History, Surgical history, Social history, and Family History were reviewed and updated.  Review of Systems: All other 10 point review of systems is negative.   Physical Exam:  weight is 178 lb 12.8 oz (81.1 kg). Her oral temperature is 98.3 F (36.8 C). Her blood pressure is 139/87 and her pulse is 64. Her respiration is 16 and oxygen saturation is 98%.   Wt Readings from Last 3 Encounters:  05/17/17 178 lb 12.8 oz (81.1 kg)  04/05/17 175 lb 12.8 oz (79.7 kg)  03/01/17 181 lb 12.8 oz (82.5 kg)    Well-developed and well-nourished white female in no obvious distress. Head and neck exam shows no ocular or oral lesions. She has no conjunctival pallor. She has no adenopathy in her neck. Her thyroid is nonpalpable. Lungs are clear bilaterally. Cardiac exam regular rate and rhythm with no murmurs, rubs or bruits. Abdomen is soft. She is good bowel sounds. There is no fluid wave. There is no palpable liver or spleen tip. Back exam shows no tenderness over the spine, ribs or hips. Extremities shows no clubbing, cyanosis or edema. Neurological exam shows no focal neurological deficits. Skin exam shows no rashes, ecchymoses or petechia.  Lab Results  Component Value Date   WBC 4.1 05/17/2017   HGB 13.9 05/17/2017   HCT 41.7 05/17/2017   MCV 87 05/17/2017   PLT 255 05/17/2017   Lab Results  Component Value Date   FERRITIN 193 04/05/2017  IRON 43 04/05/2017   TIBC 280 04/05/2017   UIBC 237 04/05/2017   IRONPCTSAT 15 (L) 04/05/2017   Lab Results  Component Value Date   RETICCTPCT 2.9 (H) 12/12/2015   RBC 4.80 05/17/2017   RETICCTABS 139.2 12/12/2015   No results found for: KPAFRELGTCHN, LAMBDASER, KAPLAMBRATIO No results found for: Kandis Cocking, IGMSERUM Lab Results  Component Value Date   TOTALPROTELP 6.8 02/28/2012   ALBUMINELP 57.8 02/28/2012   A1GS 4.0 02/28/2012   A2GS 11.7 02/28/2012   BETS 6.7 02/28/2012   BETA2SER 5.2 02/28/2012   GAMS 14.6 02/28/2012   MSPIKE NOT DET 02/28/2012   SPEI * 02/28/2012     Chemistry      Component Value Date/Time   NA 141 04/05/2017 1114   K 4.0 04/05/2017 1114   CL 104 06/20/2016 2105   CL 103 03/09/2016 1507    CL 105 09/02/2012 0859   CO2 27 04/05/2017 1114   BUN 12.0 04/05/2017 1114   CREATININE 0.7 04/05/2017 1114      Component Value Date/Time   CALCIUM 9.7 04/05/2017 1114   ALKPHOS 86 04/05/2017 1114   AST 15 04/05/2017 1114   ALT 14 04/05/2017 1114   BILITOT 0.28 04/05/2017 1114     Impression and Plan: [name] is a 56 yo white female with recurrent iron deficiency anemia.   We will have to see what her iron studies show. I will like to think that her iron levels should be okay. However, her MCV is down a little bit.  She wonders if she needs to be seen by a surgeon for the hiatal hernia. It is hard to know whether not this needs to be fixed.  As always, we will plan to get her back to see [place] in 6 more weeks.  Volanda Napoleon, MD 5/25/201812:24 PM

## 2017-05-18 LAB — RETICULOCYTES: Reticulocyte Count: 2.4 % (ref 0.6–2.6)

## 2017-05-18 LAB — VITAMIN B12: Vitamin B12: 313 pg/mL (ref 232–1245)

## 2017-06-28 ENCOUNTER — Ambulatory Visit (HOSPITAL_BASED_OUTPATIENT_CLINIC_OR_DEPARTMENT_OTHER): Payer: BLUE CROSS/BLUE SHIELD | Admitting: Hematology & Oncology

## 2017-06-28 ENCOUNTER — Other Ambulatory Visit (HOSPITAL_BASED_OUTPATIENT_CLINIC_OR_DEPARTMENT_OTHER): Payer: BLUE CROSS/BLUE SHIELD

## 2017-06-28 VITALS — BP 138/87 | HR 70 | Temp 98.1°F | Resp 18 | Wt 182.0 lb

## 2017-06-28 DIAGNOSIS — D5912 Cold autoimmune hemolytic anemia: Secondary | ICD-10-CM

## 2017-06-28 DIAGNOSIS — D509 Iron deficiency anemia, unspecified: Secondary | ICD-10-CM | POA: Diagnosis not present

## 2017-06-28 DIAGNOSIS — D500 Iron deficiency anemia secondary to blood loss (chronic): Secondary | ICD-10-CM

## 2017-06-28 DIAGNOSIS — D591 Other autoimmune hemolytic anemias: Principal | ICD-10-CM

## 2017-06-28 LAB — CBC WITH DIFFERENTIAL (CANCER CENTER ONLY)
BASO#: 0 10*3/uL (ref 0.0–0.2)
BASO%: 0.7 % (ref 0.0–2.0)
EOS ABS: 0.1 10*3/uL (ref 0.0–0.5)
EOS%: 2.4 % (ref 0.0–7.0)
HEMATOCRIT: 38.3 % (ref 34.8–46.6)
HGB: 12.4 g/dL (ref 11.6–15.9)
LYMPH#: 1.4 10*3/uL (ref 0.9–3.3)
LYMPH%: 33.3 % (ref 14.0–48.0)
MCH: 28.6 pg (ref 26.0–34.0)
MCHC: 32.4 g/dL (ref 32.0–36.0)
MCV: 89 fL (ref 81–101)
MONO#: 0.4 10*3/uL (ref 0.1–0.9)
MONO%: 8.6 % (ref 0.0–13.0)
NEUT#: 2.3 10*3/uL (ref 1.5–6.5)
NEUT%: 55 % (ref 39.6–80.0)
Platelets: 268 10*3/uL (ref 145–400)
RBC: 4.33 10*6/uL (ref 3.70–5.32)
RDW: 14.8 % (ref 11.1–15.7)
WBC: 4.2 10*3/uL (ref 3.9–10.0)

## 2017-06-28 LAB — COMPREHENSIVE METABOLIC PANEL
ALT: 13 U/L (ref 0–55)
ANION GAP: 8 meq/L (ref 3–11)
AST: 16 U/L (ref 5–34)
Albumin: 3.6 g/dL (ref 3.5–5.0)
Alkaline Phosphatase: 88 U/L (ref 40–150)
BILIRUBIN TOTAL: 0.3 mg/dL (ref 0.20–1.20)
BUN: 11 mg/dL (ref 7.0–26.0)
CALCIUM: 9.5 mg/dL (ref 8.4–10.4)
CHLORIDE: 111 meq/L — AB (ref 98–109)
CO2: 23 mEq/L (ref 22–29)
Creatinine: 0.7 mg/dL (ref 0.6–1.1)
Glucose: 90 mg/dl (ref 70–140)
Potassium: 4 mEq/L (ref 3.5–5.1)
Sodium: 143 mEq/L (ref 136–145)
Total Protein: 6.5 g/dL (ref 6.4–8.3)

## 2017-06-28 LAB — IRON AND TIBC
%SAT: 22 % (ref 21–57)
Iron: 53 ug/dL (ref 41–142)
TIBC: 239 ug/dL (ref 236–444)
UIBC: 186 ug/dL (ref 120–384)

## 2017-06-28 LAB — LACTATE DEHYDROGENASE: LDH: 180 U/L (ref 125–245)

## 2017-06-28 LAB — FERRITIN: FERRITIN: 156 ng/mL (ref 9–269)

## 2017-06-28 NOTE — Progress Notes (Signed)
Hematology and Oncology Follow Up Visit  EZRI FANGUY [id number] 12-04-1961 56 y.o. 06/28/2017   Principle Diagnosis:  Recurrent iron deficiency anemia  Current Therapy:   IV iron as indicated - last received in April 2018 x 2    Interim History:  Ms. Kothari is here today for a follow-up. She is doing okay. She does feel a little tired.  She last received iron back in April. At that point in time, her iron saturation was 15%.  In May when she was last here, her ferritin was 264 with an iron saturation of 23%.  She had a very nice vacation. She and her family were down in [facility]. She had a very good time. She is fairly liberal with using sunscreen.  She is being followed closely by gastroenterology.  She's not had any issues with obvious bleeding. There's been no melena or bright red blood per rectum.  She's had no fever. She's had some abdominal pain. She does have some reflux. She is being followed by gastroenterology for this. She has a hiatal hernia. She is being referred to a Psychologist, sport and exercise. She is not sure that she would like to be seen by surgery.  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 She is enjoying being with her 2 daughters.  She has had no problems with her appetite. Her blood pressure is doing better.  Overall, her performance status is ECOG 0.    Medications:  Allergies as of 06/28/2017      Reactions   Ciprocinonide [fluocinolone]    Penicillins    Sulfa Antibiotics       Medication List       Accurate as of 06/28/17 12:21 PM. Always use your most recent med list.          cloNIDine 0.2 MG tablet Commonly known as:  CATAPRES Take 1 tablet (0.2 mg total) by mouth 2 (two) times daily.   omeprazole 10 MG capsule Commonly known as:  PRILOSEC Take 20 mg by mouth daily.   ondansetron 4 MG tablet Commonly known as:  ZOFRAN Take 1 tablet (4 mg total) by mouth 2 (two) times daily.       Allergies:    Allergies  Allergen Reactions  . Ciprocinonide [Fluocinolone]   . Penicillins   . Sulfa Antibiotics     Past Medical History, Surgical history, Social history, and Family History were reviewed and updated.  Review of Systems: All other 10 point review of systems is negative.   Physical Exam:  weight is 182 lb (82.6 kg). Her oral temperature is 98.1 F (36.7 C). Her blood pressure is 138/87 and her pulse is 70. Her respiration is 18 and oxygen saturation is 100%.   Wt Readings from Last 3 Encounters:  06/28/17 182 lb (82.6 kg)  05/17/17 178 lb 12.8 oz (81.1 kg)  04/05/17 175 lb 12.8 oz (79.7 kg)    Well-developed and well-nourished white female in no obvious distress. Head and neck exam shows no ocular or oral lesions. She has no conjunctival pallor. She has no adenopathy in her neck. Her thyroid is nonpalpable. Lungs are clear bilaterally. Cardiac exam regular rate and rhythm with no murmurs, rubs or bruits. Abdomen is soft. She is good bowel sounds. There is no fluid wave. There is no palpable liver or spleen tip. Back exam shows no tenderness over the spine, ribs or hips. Extremities shows no clubbing, cyanosis or edema. Neurological exam shows  no focal neurological deficits. Skin exam shows no rashes, ecchymoses or petechia.  Lab Results  Component Value Date   WBC 4.2 06/28/2017   HGB 12.4 06/28/2017   HCT 38.3 06/28/2017   MCV 89 06/28/2017   PLT 268 06/28/2017   Lab Results  Component Value Date   FERRITIN 264 05/17/2017   IRON 57 05/17/2017   TIBC 251 05/17/2017   UIBC 194 05/17/2017   IRONPCTSAT 23 05/17/2017   Lab Results  Component Value Date   RETICCTPCT 2.9 (H) 12/12/2015   RBC 4.33 06/28/2017   RETICCTABS 139.2 12/12/2015   No results found for: KPAFRELGTCHN, LAMBDASER, KAPLAMBRATIO No results found for: Osborne Casco Lab Results  Component Value Date   TOTALPROTELP 6.8 02/28/2012   ALBUMINELP 57.8 02/28/2012   A1GS 4.0 02/28/2012   A2GS  11.7 02/28/2012   BETS 6.7 02/28/2012   BETA2SER 5.2 02/28/2012   GAMS 14.6 02/28/2012   MSPIKE NOT DET 02/28/2012   SPEI * 02/28/2012     Chemistry      Component Value Date/Time   NA 141 04/05/2017 1114   K 4.0 04/05/2017 1114   CL 104 06/20/2016 2105   CL 103 03/09/2016 1507   CL 105 09/02/2012 0859   CO2 27 04/05/2017 1114   BUN 12.0 04/05/2017 1114   CREATININE 0.7 04/05/2017 1114      Component Value Date/Time   CALCIUM 9.7 04/05/2017 1114   ALKPHOS 86 04/05/2017 1114   AST 15 04/05/2017 1114   ALT 14 04/05/2017 1114   BILITOT 0.28 04/05/2017 1114     Impression and Plan: Ms. Gao is a 56 yo white female with recurrent iron deficiency anemia.   I find it interesting that do not had to give her iron for a while area did  I'm going to check her for cold agglutinins. I want to make sure that we are not overlooking this as a potential reason for her to have her anemia.   We will plan to get her back to see [place] in another couple months.  She knows that she can always come in sooner if she has any problems.   Volanda Napoleon, MD 7/6/201812:21 PM

## 2017-06-29 LAB — RETICULOCYTES: RETICULOCYTE COUNT: 2.9 % — AB (ref 0.6–2.6)

## 2017-07-01 ENCOUNTER — Encounter: Payer: Self-pay | Admitting: *Deleted

## 2017-07-01 LAB — HEMOGLOBINOPATHY EVALUATION
HGB C: 0 %
HGB S: 0 %
HGB VARIANT: 0 %
Hemoglobin A2 Quantitation: 2.1 % (ref 1.8–3.2)
Hemoglobin F Quantitation: 0 % (ref 0.0–2.0)
Hgb A: 97.9 % (ref 96.4–98.8)

## 2017-07-01 LAB — COLD AGGLUTININ TITER: Cold Agglutinin Titer, Quant: NEGATIVE

## 2017-08-09 ENCOUNTER — Ambulatory Visit (HOSPITAL_BASED_OUTPATIENT_CLINIC_OR_DEPARTMENT_OTHER): Payer: Managed Care, Other (non HMO) | Admitting: Hematology & Oncology

## 2017-08-09 ENCOUNTER — Other Ambulatory Visit (HOSPITAL_BASED_OUTPATIENT_CLINIC_OR_DEPARTMENT_OTHER): Payer: Managed Care, Other (non HMO)

## 2017-08-09 VITALS — BP 152/95 | HR 66 | Temp 97.9°F | Resp 20

## 2017-08-09 DIAGNOSIS — D509 Iron deficiency anemia, unspecified: Secondary | ICD-10-CM

## 2017-08-09 DIAGNOSIS — D500 Iron deficiency anemia secondary to blood loss (chronic): Secondary | ICD-10-CM

## 2017-08-09 DIAGNOSIS — D5912 Cold autoimmune hemolytic anemia: Secondary | ICD-10-CM

## 2017-08-09 DIAGNOSIS — D591 Other autoimmune hemolytic anemias: Principal | ICD-10-CM

## 2017-08-09 LAB — CMP (CANCER CENTER ONLY)
ALBUMIN: 4.1 g/dL (ref 3.3–5.5)
ALT(SGPT): 15 U/L (ref 10–47)
AST: 20 U/L (ref 11–38)
Alkaline Phosphatase: 81 U/L (ref 26–84)
BILIRUBIN TOTAL: 0.7 mg/dL (ref 0.20–1.60)
BUN, Bld: 10 mg/dL (ref 7–22)
CALCIUM: 9.3 mg/dL (ref 8.0–10.3)
CHLORIDE: 110 meq/L — AB (ref 98–108)
CO2: 26 meq/L (ref 18–33)
Creat: 0.8 mg/dl (ref 0.6–1.2)
Glucose, Bld: 100 mg/dL (ref 73–118)
POTASSIUM: 4 meq/L (ref 3.3–4.7)
Sodium: 145 mEq/L (ref 128–145)
Total Protein: 7.2 g/dL (ref 6.4–8.1)

## 2017-08-09 LAB — CBC WITH DIFFERENTIAL (CANCER CENTER ONLY)
BASO#: 0 10*3/uL (ref 0.0–0.2)
BASO%: 0.6 % (ref 0.0–2.0)
EOS ABS: 0.1 10*3/uL (ref 0.0–0.5)
EOS%: 2.7 % (ref 0.0–7.0)
HEMATOCRIT: 36.5 % (ref 34.8–46.6)
HEMOGLOBIN: 11.5 g/dL — AB (ref 11.6–15.9)
LYMPH#: 1.1 10*3/uL (ref 0.9–3.3)
LYMPH%: 32.1 % (ref 14.0–48.0)
MCH: 28.5 pg (ref 26.0–34.0)
MCHC: 31.5 g/dL — AB (ref 32.0–36.0)
MCV: 90 fL (ref 81–101)
MONO#: 0.3 10*3/uL (ref 0.1–0.9)
MONO%: 9.3 % (ref 0.0–13.0)
NEUT%: 55.3 % (ref 39.6–80.0)
NEUTROS ABS: 1.8 10*3/uL (ref 1.5–6.5)
Platelets: 355 10*3/uL (ref 145–400)
RBC: 4.04 10*6/uL (ref 3.70–5.32)
RDW: 14.7 % (ref 11.1–15.7)
WBC: 3.3 10*3/uL — ABNORMAL LOW (ref 3.9–10.0)

## 2017-08-09 LAB — IRON AND TIBC
%SAT: 13 % — ABNORMAL LOW (ref 21–57)
IRON: 43 ug/dL (ref 41–142)
TIBC: 324 ug/dL (ref 236–444)
UIBC: 281 ug/dL (ref 120–384)

## 2017-08-09 LAB — FERRITIN: Ferritin: 33 ng/ml (ref 9–269)

## 2017-08-09 NOTE — Progress Notes (Signed)
Hematology and Oncology Follow Up Visit  Jamie Mcdowell [id number] Nov 18, 1961 56 y.o. 08/09/2017   Principle Diagnosis:  Recurrent iron deficiency anemia  Current Therapy:   IV iron as indicated - last received in April 2018 x 2    Interim History:  [name] is here today for a follow-up. She is doing okay. She does feel a little tired. She and her family have to get up tomorrow morning at around 2:00 so that they can get her daughter to the airport in [place] by 5 so she can go cancer flight to [place].  We have not given this being any iron for several months. We last saw her, her iron studies showed a ferritin of 156 with iron saturation 22%.  She's had no bleeding. Past she did have a sebaceous cyst treated by her dermatologist. This was under her left breast. She was on doxycycline for 10 days.  She last received iron back in April. At that point in time, her iron saturation was 15%.  Her appetite is good. She's had no nausea or vomiting. She's had no fever. She's had no cough. She's had no change in bowel or bladder habits.  She denies any kind of numbness in her feet or hands.  Overall, her performance status is ECOG 0.    Medications:  Allergies as of 08/09/2017      Reactions   Ciprocinonide [fluocinolone]    Penicillins    Sulfa Antibiotics       Medication List       Accurate as of 08/09/17 11:44 AM. Always use your most recent med list.          cloNIDine 0.2 MG tablet Commonly known as:  CATAPRES Take 1 tablet (0.2 mg total) by mouth 2 (two) times daily.   omeprazole 10 MG capsule Commonly known as:  PRILOSEC Take 20 mg by mouth daily.   ondansetron 4 MG tablet Commonly known as:  ZOFRAN Take 1 tablet (4 mg total) by mouth 2 (two) times daily.       Allergies:  Allergies  Allergen Reactions  . Ciprocinonide [Fluocinolone]   . Penicillins   . Sulfa Antibiotics     Past Medical History, Surgical history, Social history, and Family History  were reviewed and updated.  Review of Systems: All other 10 point review of systems is negative.   Physical Exam:  oral temperature is 97.9 F (36.6 C). Her blood pressure is 168/99 (abnormal) and her pulse is 66. Her respiration is 20 and oxygen saturation is 100%.   Wt Readings from Last 3 Encounters:  06/28/17 182 lb (82.6 kg)  05/17/17 178 lb 12.8 oz (81.1 kg)  04/05/17 175 lb 12.8 oz (79.7 kg)    Well-developed and well-nourished white female in no obvious distress. Head and neck exam shows no ocular or oral lesions. She has no conjunctival pallor. She has no adenopathy in her neck. Her thyroid is nonpalpable. Lungs are clear bilaterally. Cardiac exam regular rate and rhythm with no murmurs, rubs or bruits. Abdomen is soft. She is good bowel sounds. There is no fluid wave. There is no palpable liver or spleen tip. Back exam shows no tenderness over the spine, ribs or hips. Extremities shows no clubbing, cyanosis or edema. Neurological exam shows no focal neurological deficits. Skin exam shows no rashes, ecchymoses or petechia.  Lab Results  Component Value Date   WBC 3.3 (L) 08/09/2017   HGB 11.5 (L) 08/09/2017   HCT 36.5 08/09/2017  MCV 90 08/09/2017   PLT 355 08/09/2017   Lab Results  Component Value Date   FERRITIN 156 06/28/2017   IRON 53 06/28/2017   TIBC 239 06/28/2017   UIBC 186 06/28/2017   IRONPCTSAT 22 06/28/2017   Lab Results  Component Value Date   RETICCTPCT 2.9 (H) 12/12/2015   RBC 4.04 08/09/2017   RETICCTABS 139.2 12/12/2015   No results found for: KPAFRELGTCHN, LAMBDASER, KAPLAMBRATIO No results found for: Kandis Cocking, IGMSERUM Lab Results  Component Value Date   TOTALPROTELP 6.8 02/28/2012   ALBUMINELP 57.8 02/28/2012   A1GS 4.0 02/28/2012   A2GS 11.7 02/28/2012   BETS 6.7 02/28/2012   BETA2SER 5.2 02/28/2012   GAMS 14.6 02/28/2012   MSPIKE NOT DET 02/28/2012   SPEI * 02/28/2012     Chemistry      Component Value Date/Time   NA 145  08/09/2017 1025   NA 143 06/28/2017 1114   K 4.0 08/09/2017 1025   K 4.0 06/28/2017 1114   CL 110 (H) 08/09/2017 1025   CO2 26 08/09/2017 1025   CO2 23 06/28/2017 1114   BUN 10 08/09/2017 1025   BUN 11.0 06/28/2017 1114   CREATININE 0.8 08/09/2017 1025   CREATININE 0.7 06/28/2017 1114      Component Value Date/Time   CALCIUM 9.3 08/09/2017 1025   CALCIUM 9.5 06/28/2017 1114   ALKPHOS 81 08/09/2017 1025   ALKPHOS 88 06/28/2017 1114   AST 20 08/09/2017 1025   AST 16 06/28/2017 1114   ALT 15 08/09/2017 1025   ALT 13 06/28/2017 1114   BILITOT 0.70 08/09/2017 1025   BILITOT 0.30 06/28/2017 1114     Impression and Plan: [name] is a 56 yo white female with recurrent iron deficiency anemia.   It would not surprise me if her iron level was on the low side. It is been several months since we've given her iron. She does feel like her iron might be on the low side.  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  We will definitely pray for her daughter as she has safe travels out to [place].   Volanda Napoleon, MD 8/17/201811:44 AM

## 2017-08-10 LAB — RETICULOCYTES: RETICULOCYTE COUNT: 4.8 % — AB (ref 0.6–2.6)

## 2017-08-12 ENCOUNTER — Telehealth: Payer: Self-pay | Admitting: *Deleted

## 2017-08-12 LAB — COLD AGGLUTININ TITER: Cold Agglutinin Titer, Quant: NEGATIVE

## 2017-08-12 NOTE — Telephone Encounter (Addendum)
Patient is aware of results. Appointment made  ----- Message from Volanda Napoleon, MD sent at 08/09/2017  2:56 PM EDT ----- Call - iron is low again!!  Please set up 1 dose of Feraheme!! Set up for next week --early in the week!! Jamie Mcdowell

## 2017-08-13 ENCOUNTER — Ambulatory Visit (HOSPITAL_BASED_OUTPATIENT_CLINIC_OR_DEPARTMENT_OTHER): Payer: Managed Care, Other (non HMO)

## 2017-08-13 VITALS — BP 138/76 | HR 66 | Temp 98.0°F | Resp 19

## 2017-08-13 DIAGNOSIS — D500 Iron deficiency anemia secondary to blood loss (chronic): Secondary | ICD-10-CM

## 2017-08-13 DIAGNOSIS — D509 Iron deficiency anemia, unspecified: Secondary | ICD-10-CM

## 2017-08-13 MED ORDER — SODIUM CHLORIDE 0.9 % IV SOLN
510.0000 mg | Freq: Once | INTRAVENOUS | Status: AC
  Administered 2017-08-13: 510 mg via INTRAVENOUS
  Filled 2017-08-13: qty 17

## 2017-08-13 MED ORDER — SODIUM CHLORIDE 0.9 % IV SOLN
Freq: Once | INTRAVENOUS | Status: AC
  Administered 2017-08-13: 14:00:00 via INTRAVENOUS

## 2017-08-13 NOTE — Patient Instructions (Signed)

## 2017-08-15 ENCOUNTER — Ambulatory Visit: Payer: Self-pay | Admitting: Hematology & Oncology

## 2017-08-15 ENCOUNTER — Other Ambulatory Visit: Payer: Self-pay

## 2017-09-26 ENCOUNTER — Ambulatory Visit (HOSPITAL_BASED_OUTPATIENT_CLINIC_OR_DEPARTMENT_OTHER): Payer: Managed Care, Other (non HMO) | Admitting: Family

## 2017-09-26 ENCOUNTER — Other Ambulatory Visit (HOSPITAL_BASED_OUTPATIENT_CLINIC_OR_DEPARTMENT_OTHER): Payer: Managed Care, Other (non HMO)

## 2017-09-26 VITALS — BP 158/92 | HR 71 | Temp 98.1°F | Resp 18 | Wt 180.0 lb

## 2017-09-26 DIAGNOSIS — D509 Iron deficiency anemia, unspecified: Secondary | ICD-10-CM

## 2017-09-26 DIAGNOSIS — D500 Iron deficiency anemia secondary to blood loss (chronic): Secondary | ICD-10-CM

## 2017-09-26 LAB — CBC WITH DIFFERENTIAL (CANCER CENTER ONLY)
BASO#: 0 10*3/uL (ref 0.0–0.2)
BASO%: 0.4 % (ref 0.0–2.0)
EOS ABS: 0.1 10*3/uL (ref 0.0–0.5)
EOS%: 1.1 % (ref 0.0–7.0)
HCT: 39.1 % (ref 34.8–46.6)
HGB: 12.7 g/dL (ref 11.6–15.9)
LYMPH#: 1.9 10*3/uL (ref 0.9–3.3)
LYMPH%: 36.5 % (ref 14.0–48.0)
MCH: 27.8 pg (ref 26.0–34.0)
MCHC: 32.5 g/dL (ref 32.0–36.0)
MCV: 86 fL (ref 81–101)
MONO#: 0.4 10*3/uL (ref 0.1–0.9)
MONO%: 7.7 % (ref 0.0–13.0)
NEUT#: 2.9 10*3/uL (ref 1.5–6.5)
NEUT%: 54.3 % (ref 39.6–80.0)
PLATELETS: 284 10*3/uL (ref 145–400)
RBC: 4.57 10*6/uL (ref 3.70–5.32)
RDW: 14.6 % (ref 11.1–15.7)
WBC: 5.3 10*3/uL (ref 3.9–10.0)

## 2017-09-26 LAB — CMP (CANCER CENTER ONLY)
ALT(SGPT): 20 U/L (ref 10–47)
AST: 20 U/L (ref 11–38)
Albumin: 4.2 g/dL (ref 3.3–5.5)
Alkaline Phosphatase: 83 U/L (ref 26–84)
BUN: 14 mg/dL (ref 7–22)
CHLORIDE: 109 meq/L — AB (ref 98–108)
CO2: 27 mEq/L (ref 18–33)
Calcium: 9.6 mg/dL (ref 8.0–10.3)
Creat: 0.8 mg/dl (ref 0.6–1.2)
Glucose, Bld: 98 mg/dL (ref 73–118)
POTASSIUM: 4 meq/L (ref 3.3–4.7)
Sodium: 147 mEq/L — ABNORMAL HIGH (ref 128–145)
TOTAL PROTEIN: 7.3 g/dL (ref 6.4–8.1)
Total Bilirubin: 0.5 mg/dl (ref 0.20–1.60)

## 2017-09-26 LAB — CHCC SATELLITE - SMEAR

## 2017-09-26 LAB — RETICULOCYTES: RETICULOCYTE COUNT: 2.8 % — AB (ref 0.6–2.6)

## 2017-09-26 NOTE — Progress Notes (Signed)
Hematology and Oncology Follow Up Visit  Jamie Mcdowell [id number] 1961/11/30 56 y.o. 09/26/2017   Principle Diagnosis:  Recurrent iron deficiency anemia  Current Therapy:   IV iron as indicated - last received in August 2018   Interim History:  [name] is here today for follow-up. She is doing well and staying busy with work. 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  She has not had any episodes of bleeding, bruising or petechiae. No lymphadenopathy found on exam.  Hgb is stable at 12.7 with an MCV of 86. She received IV iron in August.  She denies fever, chills, n/v, cough, rash, chest pain, palpitations, abdominal pain or changes in bowel or bladder habits. No swelling, tenderness, numbness or tingling in her extremities. No c/o pain at this time.  She has maintained a good appetite and it staying well hydrated. Her weight is stable.   ECOG Performance Status: 1 - Symptomatic but completely ambulatory  Medications:  Allergies as of 09/26/2017      Reactions   Ciprocinonide [fluocinolone]    Penicillins    Sulfa Antibiotics       Medication List       Accurate as of 09/26/17  2:35 PM. Always use your most recent med list.          cloNIDine 0.2 MG tablet Commonly known as:  CATAPRES Take 1 tablet (0.2 mg total) by mouth 2 (two) times daily.   omeprazole 10 MG capsule Commonly known as:  PRILOSEC Take 20 mg by mouth daily.   ondansetron 4 MG tablet Commonly known as:  ZOFRAN Take 1 tablet (4 mg total) by mouth 2 (two) times daily.       Allergies:  Allergies  Allergen Reactions  . Ciprocinonide [Fluocinolone]   . Penicillins   . Sulfa Antibiotics     Past Medical History, Surgical history, Social history, and Family History were reviewed and updated.  Review of Systems: All other 10 point review of systems is negative.   Physical Exam:  vitals were not taken for this visit.  Wt Readings from Last 3 Encounters:  06/28/17 182 lb (82.6 kg)  05/17/17 178 lb 12.8 oz (81.1 kg)  04/05/17 175 lb 12.8 oz (79.7 kg)    Ocular: Sclerae unicteric, pupils equal, round and reactive to light Ear-nose-throat: Oropharynx clear, dentition fair Lymphatic: No cervical, supraclavicular or axillary adenopathy Lungs no rales or rhonchi, good excursion bilaterally Heart regular rate and rhythm, no murmur appreciated Abd soft, nontender, positive bowel sounds, no liver or spleen tip palpated on exam, no fluid wave MSK no focal spinal tenderness, no joint edema Neuro: non-focal, well-oriented, appropriate affect Breasts: Deferred   Lab Results  Component Value Date   WBC 5.3 09/26/2017   HGB 12.7 09/26/2017   HCT 39.1 09/26/2017   MCV 86 09/26/2017   PLT 284 09/26/2017   Lab Results  Component Value Date   FERRITIN 33 08/09/2017   IRON 43 08/09/2017   TIBC 324 08/09/2017   UIBC 281 08/09/2017   IRONPCTSAT 13 (L) 08/09/2017   Lab Results  Component Value Date   RETICCTPCT 2.9 (H) 12/12/2015   RBC 4.57 09/26/2017   RETICCTABS 139.2 12/12/2015   No results found for: KPAFRELGTCHN, LAMBDASER, KAPLAMBRATIO No results found for: IGGSERUM, Regino Bellow, IGMSERUM Lab Results  Component Value Date   TOTALPROTELP 6.8 02/28/2012   ALBUMINELP 57.8  02/28/2012   A1GS 4.0 02/28/2012   A2GS 11.7 02/28/2012   BETS 6.7 02/28/2012   BETA2SER 5.2 02/28/2012   GAMS 14.6 02/28/2012   MSPIKE NOT DET 02/28/2012   SPEI * 02/28/2012     Chemistry      Component Value Date/Time   NA 147 (H) 09/26/2017 1404   NA 143 06/28/2017 1114   K 4.0 09/26/2017 1404   K 4.0 06/28/2017 1114   CL 109 (H) 09/26/2017 1404   CO2 27 09/26/2017 1404   CO2 23 06/28/2017 1114   BUN 14 09/26/2017 1404   BUN 11.0 06/28/2017 1114   CREATININE 0.8 09/26/2017 1404   CREATININE 0.7 06/28/2017 1114      Component Value Date/Time   CALCIUM 9.6 09/26/2017 1404   CALCIUM  9.5 06/28/2017 1114   ALKPHOS 83 09/26/2017 1404   ALKPHOS 88 06/28/2017 1114   AST 20 09/26/2017 1404   AST 16 06/28/2017 1114   ALT 20 09/26/2017 1404   ALT 13 06/28/2017 1114   BILITOT 0.50 09/26/2017 1404   BILITOT 0.30 06/28/2017 1114      Impression and Plan: [name] is a very pleasant 56 yo caucasian female with iron deficiency anemia. She is doing well but still having some symptoms including fatigue and SOB with over exertion.  We will see what her iron studies show and bring her back in next week for an infusion if needed.  We will go ahead and plan to see her back again in another 6 weeks for repeat lab work and follow-up.  She will contact our office with any questions or concerns. We can certainly see her sooner if need be.   [name], NP 10/4/20182:35 PM

## 2017-09-27 LAB — FERRITIN: FERRITIN: 43 ng/mL (ref 9–269)

## 2017-09-27 LAB — IRON AND TIBC
%SAT: 12 % — AB (ref 21–57)
IRON: 37 ug/dL — AB (ref 41–142)
TIBC: 316 ug/dL (ref 236–444)
UIBC: 279 ug/dL (ref 120–384)

## 2017-10-03 ENCOUNTER — Ambulatory Visit (HOSPITAL_BASED_OUTPATIENT_CLINIC_OR_DEPARTMENT_OTHER): Payer: Managed Care, Other (non HMO)

## 2017-10-03 VITALS — BP 180/92 | HR 60 | Temp 98.1°F | Resp 17

## 2017-10-03 DIAGNOSIS — D500 Iron deficiency anemia secondary to blood loss (chronic): Secondary | ICD-10-CM

## 2017-10-03 DIAGNOSIS — D509 Iron deficiency anemia, unspecified: Secondary | ICD-10-CM | POA: Diagnosis not present

## 2017-10-03 MED ORDER — SODIUM CHLORIDE 0.9 % IV SOLN
510.0000 mg | Freq: Once | INTRAVENOUS | Status: AC
  Administered 2017-10-03: 510 mg via INTRAVENOUS
  Filled 2017-10-03: qty 17

## 2017-10-03 MED ORDER — SODIUM CHLORIDE 0.9 % IV SOLN
INTRAVENOUS | Status: DC
  Administered 2017-10-03: 14:00:00 via INTRAVENOUS
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
## 2017-10-03 NOTE — Patient Instructions (Signed)

## 2017-11-07 ENCOUNTER — Encounter: Payer: Self-pay | Admitting: Family

## 2017-11-07 ENCOUNTER — Other Ambulatory Visit (HOSPITAL_BASED_OUTPATIENT_CLINIC_OR_DEPARTMENT_OTHER): Payer: Managed Care, Other (non HMO)

## 2017-11-07 ENCOUNTER — Ambulatory Visit (HOSPITAL_BASED_OUTPATIENT_CLINIC_OR_DEPARTMENT_OTHER): Payer: Managed Care, Other (non HMO) | Admitting: Family

## 2017-11-07 VITALS — BP 121/85 | HR 75 | Temp 98.0°F | Resp 18 | Wt 179.0 lb

## 2017-11-07 DIAGNOSIS — D500 Iron deficiency anemia secondary to blood loss (chronic): Secondary | ICD-10-CM

## 2017-11-07 DIAGNOSIS — D509 Iron deficiency anemia, unspecified: Secondary | ICD-10-CM

## 2017-11-07 LAB — CBC WITH DIFFERENTIAL (CANCER CENTER ONLY)
BASO#: 0 10*3/uL (ref 0.0–0.2)
BASO%: 0.6 % (ref 0.0–2.0)
EOS ABS: 0.1 10*3/uL (ref 0.0–0.5)
EOS%: 1 % (ref 0.0–7.0)
HEMATOCRIT: 33.3 % — AB (ref 34.8–46.6)
HGB: 10.2 g/dL — ABNORMAL LOW (ref 11.6–15.9)
LYMPH#: 1.6 10*3/uL (ref 0.9–3.3)
LYMPH%: 33.4 % (ref 14.0–48.0)
MCH: 27.3 pg (ref 26.0–34.0)
MCHC: 30.6 g/dL — AB (ref 32.0–36.0)
MCV: 89 fL (ref 81–101)
MONO#: 0.4 10*3/uL (ref 0.1–0.9)
MONO%: 7.3 % (ref 0.0–13.0)
NEUT#: 2.8 10*3/uL (ref 1.5–6.5)
NEUT%: 57.7 % (ref 39.6–80.0)
Platelets: 373 10*3/uL (ref 145–400)
RBC: 3.74 10*6/uL (ref 3.70–5.32)
RDW: 16.1 % — AB (ref 11.1–15.7)
WBC: 4.9 10*3/uL (ref 3.9–10.0)

## 2017-11-07 LAB — CMP (CANCER CENTER ONLY)
ALT(SGPT): 17 U/L (ref 10–47)
AST: 19 U/L (ref 11–38)
Albumin: 3.8 g/dL (ref 3.3–5.5)
Alkaline Phosphatase: 86 U/L — ABNORMAL HIGH (ref 26–84)
BILIRUBIN TOTAL: 0.5 mg/dL (ref 0.20–1.60)
BUN, Bld: 11 mg/dL (ref 7–22)
CALCIUM: 9.4 mg/dL (ref 8.0–10.3)
CHLORIDE: 109 meq/L — AB (ref 98–108)
CO2: 26 meq/L (ref 18–33)
Creat: 1 mg/dl (ref 0.6–1.2)
GLUCOSE: 107 mg/dL (ref 73–118)
Potassium: 3.8 mEq/L (ref 3.3–4.7)
Sodium: 149 mEq/L — ABNORMAL HIGH (ref 128–145)
Total Protein: 7 g/dL (ref 6.4–8.1)

## 2017-11-07 NOTE — Progress Notes (Signed)
Hematology and Oncology Follow Up Visit  Jamie Mcdowell [id number] 1961-07-16 56 y.o. 11/07/2017   Principle Diagnosis:  Recurrent iron deficiency anemia  Current Therapy:   IV iron as indicated - last received in October 2018   Interim History:  Jamie Mcdowell is here today for follow-up. She is feeling fatigued and SOB with exertion. She has had several episodes of palpitations. Hgb is down at 10.2 with an MCV of 89. Iron studies are pending.  She has had no episodes of bleeding, bruising or petechiae. No lymphadenopathy found on exam.  She has had some nausea without vomiting possibly due to GERD.  No fever, chills, cough, rash, dizziness, chest pain, abdominal pain or changes in bowel or bladder habits.  No swelling, tenderness, numbness or tingling in her extremities. No c/o pain.  She has maintained a good appetite and is staying well hydrated. Her weight is stable.   ECOG Performance Status: 1 - Symptomatic but completely ambulatory  Medications:  Allergies as of 11/07/2017      Reactions   Ciprocinonide [fluocinolone]    Penicillins    Sulfa Antibiotics       Medication List        Accurate as of 11/07/17  4:54 PM. Always use your most recent med list.          cloNIDine 0.2 MG tablet Commonly known as:  CATAPRES Take 1 tablet (0.2 mg total) by mouth 2 (two) times daily.   omeprazole 10 MG capsule Commonly known as:  PRILOSEC Take 20 mg by mouth daily.   ondansetron 4 MG tablet Commonly known as:  ZOFRAN Take 1 tablet (4 mg total) by mouth 2 (two) times daily.   Vitamin D (Ergocalciferol) 50000 units Caps capsule Commonly known as:  DRISDOL       Allergies:  Allergies  Allergen Reactions  . Ciprocinonide [Fluocinolone]   . Penicillins   . Sulfa Antibiotics     Past Medical History, Surgical history, Social history, and Family History were reviewed and updated.  Review of Systems: All other 10 point review of systems is negative.   Physical  Exam:  weight is 179 lb (81.2 kg). Her oral temperature is 98 F (36.7 C). Her blood pressure is 121/85 and her pulse is 75. Her respiration is 18 and oxygen saturation is 100%.   Wt Readings from Last 3 Encounters:  11/07/17 179 lb (81.2 kg)  09/26/17 180 lb (81.6 kg)  06/28/17 182 lb (82.6 kg)    Ocular: Sclerae unicteric, pupils equal, round and reactive to light Ear-nose-throat: Oropharynx clear, dentition fair Lymphatic: No cervical, supraclavicular or axillary adenopathy Lungs no rales or rhonchi, good excursion bilaterally Heart regular rate and rhythm, no murmur appreciated Abd soft, nontender, positive bowel sounds, no liver or spleen tip palpated on exam, no fluid wave  MSK no focal spinal tenderness, no joint edema Neuro: non-focal, well-oriented, appropriate affect Breasts: Deferred   Lab Results  Component Value Date   WBC 4.9 11/07/2017   HGB 10.2 (L) 11/07/2017   HCT 33.3 (L) 11/07/2017   MCV 89 11/07/2017   PLT 373 11/07/2017   Lab Results  Component Value Date   FERRITIN 43 09/26/2017   IRON 37 (L) 09/26/2017   TIBC 316 09/26/2017   UIBC 279 09/26/2017   IRONPCTSAT 12 (L) 09/26/2017   Lab Results  Component Value Date   RETICCTPCT 2.9 (H) 12/12/2015   RBC 3.74 11/07/2017   RETICCTABS 139.2 12/12/2015   No results found for:  KPAFRELGTCHN, LAMBDASER, KAPLAMBRATIO No results found for: Kandis Cocking, [facility] Lab Results  Component Value Date   TOTALPROTELP 6.8 02/28/2012   ALBUMINELP 57.8 02/28/2012   A1GS 4.0 02/28/2012   A2GS 11.7 02/28/2012   BETS 6.7 02/28/2012   BETA2SER 5.2 02/28/2012   GAMS 14.6 02/28/2012   MSPIKE NOT DET 02/28/2012   SPEI * 02/28/2012     Chemistry      Component Value Date/Time   NA 149 (H) 11/07/2017 1404   NA 143 06/28/2017 1114   K 3.8 11/07/2017 1404   K 4.0 06/28/2017 1114   CL 109 (H) 11/07/2017 1404   CO2 26 11/07/2017 1404   CO2 23 06/28/2017 1114   BUN 11 11/07/2017 1404   BUN 11.0 06/28/2017 1114    CREATININE 1.0 11/07/2017 1404   CREATININE 0.7 06/28/2017 1114      Component Value Date/Time   CALCIUM 9.4 11/07/2017 1404   CALCIUM 9.5 06/28/2017 1114   ALKPHOS 86 (H) 11/07/2017 1404   ALKPHOS 88 06/28/2017 1114   AST 19 11/07/2017 1404   AST 16 06/28/2017 1114   ALT 17 11/07/2017 1404   ALT 13 06/28/2017 1114   BILITOT 0.50 11/07/2017 1404   BILITOT 0.30 06/28/2017 1114      Impression and Plan: Jamie Mcdowell is a very pleasant 56 yo caucasian female with iron deficiency anemia. She is symptomatic with fatigue, SOB with exertion and palpitations. We will see what her iron studies show and bring her back in next week for an infusion if needed.  We will plan to see her back in another 6 weeks for follow-up.  She will contact our office with any questions or concerns. We can certainly see her sooner if need be.   [name], NP 11/15/20184:54 PM

## 2017-11-08 LAB — FERRITIN: FERRITIN: 33 ng/mL (ref 9–269)

## 2017-11-08 LAB — IRON AND TIBC
%SAT: 8 % — ABNORMAL LOW (ref 21–57)
Iron: 28 ug/dL — ABNORMAL LOW (ref 41–142)
TIBC: 341 ug/dL (ref 236–444)
UIBC: 313 ug/dL (ref 120–384)

## 2017-11-08 LAB — RETICULOCYTES: Reticulocyte Count: 4.9 % — ABNORMAL HIGH (ref 0.6–2.6)

## 2017-11-12 ENCOUNTER — Ambulatory Visit (HOSPITAL_BASED_OUTPATIENT_CLINIC_OR_DEPARTMENT_OTHER): Payer: Managed Care, Other (non HMO)

## 2017-11-12 DIAGNOSIS — D509 Iron deficiency anemia, unspecified: Secondary | ICD-10-CM

## 2017-11-12 DIAGNOSIS — D500 Iron deficiency anemia secondary to blood loss (chronic): Secondary | ICD-10-CM

## 2017-11-12 MED ORDER — SODIUM CHLORIDE 0.9 % IV SOLN
510.0000 mg | Freq: Once | INTRAVENOUS | Status: AC
  Administered 2017-11-12: 510 mg via INTRAVENOUS
  Filled 2017-11-12: qty 17

## 2017-11-12 NOTE — Patient Instructions (Signed)

## 2017-11-21 ENCOUNTER — Ambulatory Visit (HOSPITAL_BASED_OUTPATIENT_CLINIC_OR_DEPARTMENT_OTHER): Payer: Managed Care, Other (non HMO)

## 2017-11-21 VITALS — BP 129/83 | HR 68 | Temp 98.1°F | Resp 17

## 2017-11-21 DIAGNOSIS — D509 Iron deficiency anemia, unspecified: Secondary | ICD-10-CM | POA: Diagnosis not present

## 2017-11-21 DIAGNOSIS — D500 Iron deficiency anemia secondary to blood loss (chronic): Secondary | ICD-10-CM

## 2017-11-21 MED ORDER — SODIUM CHLORIDE 0.9 % IV SOLN
510.0000 mg | Freq: Once | INTRAVENOUS | Status: AC
  Administered 2017-11-21: 510 mg via INTRAVENOUS
  Filled 2017-11-21: qty 17

## 2017-11-21 NOTE — Patient Instructions (Signed)

## 2017-12-19 ENCOUNTER — Other Ambulatory Visit (HOSPITAL_BASED_OUTPATIENT_CLINIC_OR_DEPARTMENT_OTHER): Payer: Managed Care, Other (non HMO)

## 2017-12-19 ENCOUNTER — Other Ambulatory Visit: Payer: Self-pay

## 2017-12-19 ENCOUNTER — Ambulatory Visit (HOSPITAL_BASED_OUTPATIENT_CLINIC_OR_DEPARTMENT_OTHER): Payer: Managed Care, Other (non HMO) | Admitting: Family

## 2017-12-19 ENCOUNTER — Encounter: Payer: Self-pay | Admitting: Family

## 2017-12-19 VITALS — BP 158/92 | HR 65 | Temp 97.9°F | Resp 18 | Wt 181.0 lb

## 2017-12-19 DIAGNOSIS — D500 Iron deficiency anemia secondary to blood loss (chronic): Secondary | ICD-10-CM

## 2017-12-19 DIAGNOSIS — D509 Iron deficiency anemia, unspecified: Secondary | ICD-10-CM | POA: Diagnosis not present

## 2017-12-19 LAB — CBC WITH DIFFERENTIAL (CANCER CENTER ONLY)
BASO#: 0 10*3/uL (ref 0.0–0.2)
BASO%: 0.7 % (ref 0.0–2.0)
EOS%: 1.8 % (ref 0.0–7.0)
Eosinophils Absolute: 0.1 10*3/uL (ref 0.0–0.5)
HCT: 38.6 % (ref 34.8–46.6)
HGB: 12.4 g/dL (ref 11.6–15.9)
LYMPH#: 1.4 10*3/uL (ref 0.9–3.3)
LYMPH%: 30 % (ref 14.0–48.0)
MCH: 28.6 pg (ref 26.0–34.0)
MCHC: 32.1 g/dL (ref 32.0–36.0)
MCV: 89 fL (ref 81–101)
MONO#: 0.3 10*3/uL (ref 0.1–0.9)
MONO%: 7 % (ref 0.0–13.0)
NEUT#: 2.8 10*3/uL (ref 1.5–6.5)
NEUT%: 60.5 % (ref 39.6–80.0)
PLATELETS: 306 10*3/uL (ref 145–400)
RBC: 4.33 10*6/uL (ref 3.70–5.32)
RDW: 15.7 % (ref 11.1–15.7)
WBC: 4.5 10*3/uL (ref 3.9–10.0)

## 2017-12-19 LAB — CMP (CANCER CENTER ONLY)
ALK PHOS: 88 U/L — AB (ref 26–84)
ALT: 16 U/L (ref 10–47)
AST: 16 U/L (ref 11–38)
Albumin: 3.6 g/dL (ref 3.3–5.5)
BUN: 12 mg/dL (ref 7–22)
CHLORIDE: 108 meq/L (ref 98–108)
CO2: 27 mEq/L (ref 18–33)
CREATININE: 0.9 mg/dL (ref 0.6–1.2)
Calcium: 9.5 mg/dL (ref 8.0–10.3)
GLUCOSE: 100 mg/dL (ref 73–118)
POTASSIUM: 4.1 meq/L (ref 3.3–4.7)
SODIUM: 148 meq/L — AB (ref 128–145)
Total Bilirubin: 0.5 mg/dl (ref 0.20–1.60)
Total Protein: 7 g/dL (ref 6.4–8.1)

## 2017-12-19 NOTE — Progress Notes (Signed)
Hematology and Oncology Follow Up Visit  Jamie Mcdowell [id number] [birth year] 56 y.o. 12/19/2017   Principle Diagnosis:  Recurrent iron deficiency anemia  Current Therapy:   IV iron as indicated - last received in October2018     Interim History:  Jamie Mcdowell is here today for follow-up. She is doing well and her energy has been a little better. She still has some occasional fatigue and palpitations.  Her SOB with over exertion is unchanged. Hgb is 12.4 with an MCV of 89. Platelet count is 306.  She has had no episodes of bleeding, no bruising or petechiae. No lymphadenopathy found on exam.  No fever, chills, n/v, cough, rash, dizziness, chest pain, abdominal pain or changes in bowel or bladder habits.  No swelling, tenderness, numbness or tingling in her extremities.  He has maintained a good appetite and is staying well hydrated. Her weight is stable.   ECOG Performance Status: 1 - Symptomatic but completely ambulatory  Medications:  Allergies as of 12/19/2017      Reactions   Ciprocinonide [fluocinolone]    Penicillins    Sulfa Antibiotics       Medication List        Accurate as of 12/19/17  3:28 PM. Always use your most recent med list.          cloNIDine 0.2 MG tablet Commonly known as:  CATAPRES Take 1 tablet (0.2 mg total) by mouth 2 (two) times daily.   omeprazole 10 MG capsule Commonly known as:  PRILOSEC Take 20 mg by mouth daily.   ondansetron 4 MG tablet Commonly known as:  ZOFRAN Take 1 tablet (4 mg total) by mouth 2 (two) times daily.   Vitamin D (Ergocalciferol) 50000 units Caps capsule Commonly known as:  DRISDOL       Allergies:  Allergies  Allergen Reactions  . Ciprocinonide [Fluocinolone]   . Penicillins   . Sulfa Antibiotics     Past Medical History, Surgical history, Social history, and Family History were reviewed and updated.  Review of Systems: All other 10 point review of systems is negative.   Physical Exam:  weight is 181  lb (82.1 kg). Her oral temperature is 97.9 F (36.6 C). Her blood pressure is 158/92 (abnormal) and her pulse is 65. Her respiration is 18 and oxygen saturation is 99%.   Wt Readings from Last 3 Encounters:  12/19/17 181 lb (82.1 kg)  11/07/17 179 lb (81.2 kg)  09/26/17 180 lb (81.6 kg)    Ocular: Sclerae unicteric, pupils equal, round and reactive to light Ear-nose-throat: Oropharynx clear, dentition fair Lymphatic: No cervical, supraclavicular or axillary adenopathy Lungs no rales or rhonchi, good excursion bilaterally Heart regular rate and rhythm, no murmur appreciated Abd soft, nontender, positive bowel sounds, no liver or spleen tip palpated on exam, no fluid wave  MSK no focal spinal tenderness, no joint edema Neuro: non-focal, well-oriented, appropriate affect Breasts: Deferred   Lab Results  Component Value Date   WBC 4.5 12/19/2017   HGB 12.4 12/19/2017   HCT 38.6 12/19/2017   MCV 89 12/19/2017   PLT 306 12/19/2017   Lab Results  Component Value Date   FERRITIN 33 11/07/2017   IRON 28 (L) 11/07/2017   TIBC 341 11/07/2017   UIBC 313 11/07/2017   IRONPCTSAT 8 (L) 11/07/2017   Lab Results  Component Value Date   RETICCTPCT 2.9 (H) 12/12/2015   RBC 4.33 12/19/2017   RETICCTABS 139.2 12/12/2015   No results found for: KPAFRELGTCHN, LAMBDASER,  KAPLAMBRATIO No results found for: Osborne Casco Lab Results  Component Value Date   TOTALPROTELP 6.8 02/28/2012   ALBUMINELP 57.8 02/28/2012   A1GS 4.0 02/28/2012   A2GS 11.7 02/28/2012   BETS 6.7 02/28/2012   BETA2SER 5.2 02/28/2012   GAMS 14.6 02/28/2012   MSPIKE NOT DET 02/28/2012   SPEI * 02/28/2012     Chemistry      Component Value Date/Time   NA 148 (H) 12/19/2017 1355   NA 143 06/28/2017 1114   K 4.1 12/19/2017 1355   K 4.0 06/28/2017 1114   CL 108 12/19/2017 1355   CO2 27 12/19/2017 1355   CO2 23 06/28/2017 1114   BUN 12 12/19/2017 1355   BUN 11.0 06/28/2017 1114   CREATININE 0.9  12/19/2017 1355   CREATININE 0.7 06/28/2017 1114      Component Value Date/Time   CALCIUM 9.5 12/19/2017 1355   CALCIUM 9.5 06/28/2017 1114   ALKPHOS 88 (H) 12/19/2017 1355   ALKPHOS 88 06/28/2017 1114   AST 16 12/19/2017 1355   AST 16 06/28/2017 1114   ALT 16 12/19/2017 1355   ALT 13 06/28/2017 1114   BILITOT 0.50 12/19/2017 1355   BILITOT 0.30 06/28/2017 1114      Impression and Plan: Jamie Mcdowell is a very pleasant 56 yo caucasian female with iron deficiency anemia. She is doing well and feels better after receiving 2 doses of IV iron in November. She is still having some fatigue and occasional palpitations.  We will see what her iron studies show and bring her back in next week for infusion if needed.  We will plan to see her back in another 6 weeks for follow-up.  She will contact our office with any questions or concerns. We can certainly see her sooner if need be.   [name], NP 12/27/20183:28 PM

## 2017-12-20 LAB — FERRITIN: Ferritin: 80 ng/ml (ref 9–269)

## 2017-12-20 LAB — IRON AND TIBC
%SAT: 15 % — AB (ref 21–57)
Iron: 44 ug/dL (ref 41–142)
TIBC: 283 ug/dL (ref 236–444)
UIBC: 239 ug/dL (ref 120–384)

## 2017-12-20 LAB — RETICULOCYTES: RETICULOCYTE COUNT: 3.6 % — AB (ref 0.6–2.6)

## 2017-12-24 DIAGNOSIS — Z9289 Personal history of other medical treatment: Secondary | ICD-10-CM

## 2017-12-24 HISTORY — DX: Personal history of other medical treatment: Z92.89

## 2017-12-27 ENCOUNTER — Ambulatory Visit (HOSPITAL_BASED_OUTPATIENT_CLINIC_OR_DEPARTMENT_OTHER): Payer: Managed Care, Other (non HMO)

## 2017-12-27 ENCOUNTER — Other Ambulatory Visit: Payer: Self-pay

## 2017-12-27 VITALS — BP 159/91 | HR 65 | Temp 98.3°F | Resp 18

## 2017-12-27 DIAGNOSIS — D500 Iron deficiency anemia secondary to blood loss (chronic): Secondary | ICD-10-CM

## 2017-12-27 DIAGNOSIS — D509 Iron deficiency anemia, unspecified: Secondary | ICD-10-CM | POA: Diagnosis not present

## 2017-12-27 MED ORDER — SODIUM CHLORIDE 0.9 % IV SOLN
510.0000 mg | Freq: Once | INTRAVENOUS | Status: AC
  Administered 2017-12-27: 510 mg via INTRAVENOUS
  Filled 2017-12-27: qty 17

## 2017-12-27 NOTE — Patient Instructions (Signed)

## 2018-01-03 ENCOUNTER — Other Ambulatory Visit: Payer: Self-pay

## 2018-01-03 ENCOUNTER — Inpatient Hospital Stay: Payer: Managed Care, Other (non HMO) | Attending: Hematology & Oncology

## 2018-01-03 VITALS — BP 134/84 | HR 52 | Temp 98.0°F | Resp 18

## 2018-01-03 DIAGNOSIS — D509 Iron deficiency anemia, unspecified: Secondary | ICD-10-CM | POA: Diagnosis not present

## 2018-01-03 DIAGNOSIS — D500 Iron deficiency anemia secondary to blood loss (chronic): Secondary | ICD-10-CM

## 2018-01-03 MED ORDER — SODIUM CHLORIDE 0.9 % IV SOLN
510.0000 mg | Freq: Once | INTRAVENOUS | Status: AC
  Administered 2018-01-03: 510 mg via INTRAVENOUS
  Filled 2018-01-03: qty 17

## 2018-01-03 MED ORDER — SODIUM CHLORIDE 0.9 % IV SOLN
Freq: Once | INTRAVENOUS | Status: AC
  Administered 2018-01-03: 11:00:00 via INTRAVENOUS

## 2018-01-03 NOTE — Patient Instructions (Signed)

## 2018-01-30 ENCOUNTER — Inpatient Hospital Stay: Payer: Managed Care, Other (non HMO) | Attending: Hematology & Oncology | Admitting: Family

## 2018-01-30 ENCOUNTER — Other Ambulatory Visit: Payer: Self-pay

## 2018-01-30 ENCOUNTER — Inpatient Hospital Stay: Payer: Managed Care, Other (non HMO)

## 2018-01-30 VITALS — BP 124/89 | HR 65 | Temp 98.1°F | Resp 18 | Wt 180.0 lb

## 2018-01-30 DIAGNOSIS — D509 Iron deficiency anemia, unspecified: Secondary | ICD-10-CM | POA: Diagnosis not present

## 2018-01-30 DIAGNOSIS — D500 Iron deficiency anemia secondary to blood loss (chronic): Secondary | ICD-10-CM

## 2018-01-30 LAB — CMP (CANCER CENTER ONLY)
ALBUMIN: 4 g/dL (ref 3.5–5.0)
ALK PHOS: 97 U/L (ref 40–150)
ALT: 13 U/L (ref 0–55)
AST: 16 U/L (ref 5–34)
Anion gap: 9 (ref 3–11)
BILIRUBIN TOTAL: 0.2 mg/dL (ref 0.2–1.2)
BUN: 14 mg/dL (ref 7–26)
CO2: 26 mmol/L (ref 22–29)
Calcium: 9.9 mg/dL (ref 8.4–10.4)
Chloride: 109 mmol/L (ref 98–109)
Creatinine: 0.77 mg/dL (ref 0.60–1.10)
GFR, Est AFR Am: >60 mL/min (ref >60)
GFR, Estimated: >60 mL/min (ref >60)
GLUCOSE: 95 mg/dL (ref 70–140)
POTASSIUM: 4.8 mmol/L (ref 3.5–5.1)
SODIUM: 144 mmol/L (ref 136–145)
TOTAL PROTEIN: 7.2 g/dL (ref 6.4–8.3)

## 2018-01-30 LAB — CBC WITH DIFFERENTIAL (CANCER CENTER ONLY)
BASOS ABS: 0 10*3/uL (ref 0.0–0.1)
BASOS PCT: 1 %
EOS ABS: 0.1 10*3/uL (ref 0.0–0.5)
Eosinophils Relative: 1 %
HEMATOCRIT: 42.1 % (ref 34.8–46.6)
Hemoglobin: 13.8 g/dL (ref 11.6–15.9)
Lymphocytes Relative: 30 %
Lymphs Abs: 1.5 10*3/uL (ref 0.9–3.3)
MCH: 29.1 pg (ref 26.0–34.0)
MCHC: 32.8 g/dL (ref 32.0–36.0)
MCV: 88.6 fL (ref 81.0–101.0)
Monocytes Absolute: 0.4 10*3/uL (ref 0.1–0.9)
Monocytes Relative: 7 %
NEUTROS ABS: 3 10*3/uL (ref 1.5–6.5)
NEUTROS PCT: 61 %
Platelet Count: 265 10*3/uL (ref 145–400)
RBC: 4.75 MIL/uL (ref 3.70–5.32)
RDW: 15.4 % (ref 11.1–15.7)
WBC Count: 4.9 10*3/uL (ref 3.9–10.0)

## 2018-01-30 LAB — RETICULOCYTES
RBC.: 4.73 MIL/uL (ref 3.70–5.45)
Retic Count, Absolute: 108.8 10*3/uL — ABNORMAL HIGH (ref 33.7–90.7)
Retic Ct Pct: 2.3 % — ABNORMAL HIGH (ref 0.7–2.1)

## 2018-01-30 NOTE — Progress Notes (Signed)
Hematology and Oncology Follow Up Visit  Jamie Mcdowell [id number] Mar 07, 1961 57 y.o. 01/30/2018   Principle Diagnosis:  Recurrent iron deficiency anemia  Current Therapy:   IV iron as indicated - last received in January 2019 x 2     Interim History:  Jamie Mcdowell is here today for follow-up. She is doing well but will still have intermittent episodes of fatigue. She has occasional palpitations and SOB with exertion.  No fever, chills, n/v, cough, rash, dizziness, SOB, chest pain, palpitations, abdominal pain or changes in bowel or bladder habits.  No bleeding, bruising or petechiae. No lymphadenopathy found on exam.  No swelling, tenderness, numbness or tingling. No c/o pain.  She has maintained a good appetite and is staying well hydrated. Her weight is stable.   ECOG Performance Status: 1 - Symptomatic but completely ambulatory  Medications:  Allergies as of 01/30/2018      Reactions   Ciprocinonide [fluocinolone]    Penicillins    Sulfa Antibiotics       Medication List        Accurate as of 01/30/18  2:37 PM. Always use your most recent med list.          cloNIDine 0.2 MG tablet Commonly known as:  CATAPRES Take 1 tablet (0.2 mg total) by mouth 2 (two) times daily.   omeprazole 10 MG capsule Commonly known as:  PRILOSEC Take 20 mg by mouth daily.   ondansetron 4 MG tablet Commonly known as:  ZOFRAN Take 1 tablet (4 mg total) by mouth 2 (two) times daily.   Vitamin D (Ergocalciferol) 50000 units Caps capsule Commonly known as:  DRISDOL       Allergies:  Allergies  Allergen Reactions  . Ciprocinonide [Fluocinolone]   . Penicillins   . Sulfa Antibiotics     Past Medical History, Surgical history, Social history, and Family History were reviewed and updated.  Review of Systems: All other 10 point review of systems is negative.   Physical Exam:  weight is 180 lb (81.6 kg). Her oral temperature is 98.1 F (36.7 C). Her blood pressure is 124/89 and her pulse  is 65. Her respiration is 18 and oxygen saturation is 100%.   Wt Readings from Last 3 Encounters:  01/30/18 180 lb (81.6 kg)  12/19/17 181 lb (82.1 kg)  11/07/17 179 lb (81.2 kg)    Ocular: Sclerae unicteric, pupils equal, round and reactive to light Ear-nose-throat: Oropharynx clear, dentition fair Lymphatic: No cervical, supraclavicular or axillary adenopathy Lungs no rales or rhonchi, good excursion bilaterally Heart regular rate and rhythm, no murmur appreciated Abd soft, nontender, positive bowel sounds, no liver or spleen tip palpated on exam, no fluid wave  MSK no focal spinal tenderness, no joint edema Neuro: non-focal, well-oriented, appropriate affect Breasts: Deferred   Lab Results  Component Value Date   WBC 4.9 01/30/2018   HGB 12.4 12/19/2017   HCT 42.1 01/30/2018   MCV 88.6 01/30/2018   PLT 265 01/30/2018   Lab Results  Component Value Date   FERRITIN 80 12/19/2017   IRON 44 12/19/2017   TIBC 283 12/19/2017   UIBC 239 12/19/2017   IRONPCTSAT 15 (L) 12/19/2017   Lab Results  Component Value Date   RETICCTPCT 2.9 (H) 12/12/2015   RBC 4.75 01/30/2018   RETICCTABS 139.2 12/12/2015   No results found for: KPAFRELGTCHN, LAMBDASER, KAPLAMBRATIO No results found for: IGGSERUM, Regino Bellow, IGMSERUM Lab Results  Component Value Date   TOTALPROTELP 6.8 02/28/2012   ALBUMINELP 57.8  02/28/2012   A1GS 4.0 02/28/2012   A2GS 11.7 02/28/2012   BETS 6.7 02/28/2012   BETA2SER 5.2 02/28/2012   GAMS 14.6 02/28/2012   MSPIKE NOT DET 02/28/2012   SPEI * 02/28/2012     Chemistry      Component Value Date/Time   NA 148 (H) 12/19/2017 1355   NA 143 06/28/2017 1114   K 4.1 12/19/2017 1355   K 4.0 06/28/2017 1114   CL 108 12/19/2017 1355   CO2 27 12/19/2017 1355   CO2 23 06/28/2017 1114   BUN 12 12/19/2017 1355   BUN 11.0 06/28/2017 1114   CREATININE 0.9 12/19/2017 1355   CREATININE 0.7 06/28/2017 1114      Component Value Date/Time   CALCIUM 9.5 12/19/2017 1355    CALCIUM 9.5 06/28/2017 1114   ALKPHOS 88 (H) 12/19/2017 1355   ALKPHOS 88 06/28/2017 1114   AST 16 12/19/2017 1355   AST 16 06/28/2017 1114   ALT 16 12/19/2017 1355   ALT 13 06/28/2017 1114   BILITOT 0.50 12/19/2017 1355   BILITOT 0.30 06/28/2017 1114       Impression and Plan: Jamie Mcdowell is a very pleasant 57 yo caucasian female with iron deficiency anemia. She is doing well but still having occasional fatigue, SOB and palpitations.  We will see what her iron studies show and bring her back in for infusion if needed.  We will see her back in another 6 weeks for follow-up and lab.  She will contact our office with any questions or concerns. We can certainly see her sooner if need be.   Jamie Peace, NP 2/7/20192:37 PM

## 2018-01-31 LAB — IRON AND TIBC
Iron: 49 ug/dL (ref 41–142)
SATURATION RATIOS: 20 % — AB (ref 21–57)
TIBC: 251 ug/dL (ref 236–444)
UIBC: 202 ug/dL

## 2018-01-31 LAB — FERRITIN: FERRITIN: 246 ng/mL (ref 9–269)

## 2018-02-07 ENCOUNTER — Other Ambulatory Visit: Payer: Self-pay

## 2018-02-07 ENCOUNTER — Inpatient Hospital Stay: Payer: Managed Care, Other (non HMO)

## 2018-02-07 VITALS — BP 147/87 | HR 57 | Temp 97.5°F | Resp 18

## 2018-02-07 DIAGNOSIS — D500 Iron deficiency anemia secondary to blood loss (chronic): Secondary | ICD-10-CM

## 2018-02-07 DIAGNOSIS — D509 Iron deficiency anemia, unspecified: Secondary | ICD-10-CM | POA: Diagnosis not present

## 2018-02-07 MED ORDER — SODIUM CHLORIDE 0.9 % IV SOLN
510.0000 mg | Freq: Once | INTRAVENOUS | Status: AC
  Administered 2018-02-07: 510 mg via INTRAVENOUS
  Filled 2018-02-07: qty 17

## 2018-02-07 NOTE — Patient Instructions (Signed)

## 2018-03-13 ENCOUNTER — Encounter: Payer: Self-pay | Admitting: Family

## 2018-03-13 ENCOUNTER — Inpatient Hospital Stay: Payer: Managed Care, Other (non HMO)

## 2018-03-13 ENCOUNTER — Inpatient Hospital Stay: Payer: Managed Care, Other (non HMO) | Attending: Hematology & Oncology | Admitting: Family

## 2018-03-13 ENCOUNTER — Other Ambulatory Visit: Payer: Self-pay

## 2018-03-13 VITALS — BP 149/87 | HR 74 | Temp 97.8°F | Resp 19 | Wt 181.0 lb

## 2018-03-13 DIAGNOSIS — D500 Iron deficiency anemia secondary to blood loss (chronic): Secondary | ICD-10-CM

## 2018-03-13 DIAGNOSIS — R5383 Other fatigue: Secondary | ICD-10-CM | POA: Diagnosis not present

## 2018-03-13 DIAGNOSIS — D509 Iron deficiency anemia, unspecified: Secondary | ICD-10-CM | POA: Insufficient documentation

## 2018-03-13 DIAGNOSIS — E559 Vitamin D deficiency, unspecified: Secondary | ICD-10-CM

## 2018-03-13 DIAGNOSIS — R0602 Shortness of breath: Secondary | ICD-10-CM | POA: Diagnosis not present

## 2018-03-13 DIAGNOSIS — R42 Dizziness and giddiness: Secondary | ICD-10-CM | POA: Insufficient documentation

## 2018-03-13 LAB — CMP (CANCER CENTER ONLY)
ALBUMIN: 4.1 g/dL (ref 3.5–5.0)
ALK PHOS: 104 U/L (ref 40–150)
ALT: 13 U/L (ref 0–55)
AST: 16 U/L (ref 5–34)
Anion gap: 12 — ABNORMAL HIGH (ref 3–11)
BUN: 13 mg/dL (ref 7–26)
CALCIUM: 10.2 mg/dL (ref 8.4–10.4)
CO2: 24 mmol/L (ref 22–29)
CREATININE: 0.79 mg/dL (ref 0.60–1.10)
Chloride: 110 mmol/L — ABNORMAL HIGH (ref 98–109)
GFR, Est AFR Am: >60 mL/min (ref >60)
GFR, Estimated: >60 mL/min (ref >60)
GLUCOSE: 90 mg/dL (ref 70–140)
Potassium: 4.4 mmol/L (ref 3.5–5.1)
Sodium: 146 mmol/L — ABNORMAL HIGH (ref 136–145)
Total Bilirubin: 0.4 mg/dL (ref 0.2–1.2)
Total Protein: 7.5 g/dL (ref 6.4–8.3)

## 2018-03-13 LAB — CBC WITH DIFFERENTIAL (CANCER CENTER ONLY)
BASOS PCT: 0 %
Basophils Absolute: 0 10*3/uL (ref 0.0–0.1)
EOS ABS: 0.1 10*3/uL (ref 0.0–0.5)
Eosinophils Relative: 1 %
HEMATOCRIT: 42.6 % (ref 34.8–46.6)
Hemoglobin: 14.3 g/dL (ref 11.6–15.9)
Lymphocytes Relative: 33 %
Lymphs Abs: 1.5 10*3/uL (ref 0.9–3.3)
MCH: 29.7 pg (ref 26.0–34.0)
MCHC: 33.6 g/dL (ref 32.0–36.0)
MCV: 88.6 fL (ref 81.0–101.0)
MONO ABS: 0.4 10*3/uL (ref 0.1–0.9)
Monocytes Relative: 9 %
Neutro Abs: 2.6 10*3/uL (ref 1.5–6.5)
Neutrophils Relative %: 57 %
Platelet Count: 270 10*3/uL (ref 145–400)
RBC: 4.81 MIL/uL (ref 3.70–5.32)
RDW: 14.4 % (ref 11.1–15.7)
WBC Count: 4.6 10*3/uL (ref 3.9–10.0)

## 2018-03-13 LAB — RETICULOCYTES
RBC.: 4.73 MIL/uL (ref 3.70–5.45)
Retic Count, Absolute: 132.4 10*3/uL — ABNORMAL HIGH (ref 33.7–90.7)
Retic Ct Pct: 2.8 % — ABNORMAL HIGH (ref 0.7–2.1)

## 2018-03-13 NOTE — Progress Notes (Signed)
Hematology and Oncology Follow Up Visit  Jamie Mcdowell [id number] [birth year] 57 y.o. 03/13/2018   Principle Diagnosis:  Recurrent iron deficiency anemia  Current Therapy:   IV iron as indicated - last received in January 2019 x 2   Interim History:  Jamie Mcdowell is here today for follow-up. She states that she is still having fatigue, dizziness, SOB and occasional palpitations.  She is concerned about the cost of the visits and infusions and has a high deductible ($6000). She is wanting to space out her appointments a little more. We discussed her contacting us if her symptoms worsen between visits.  She has had no episodes of bleeding. No bruising or petechiae.  No fever, chills, n/v, cough, rash, chest pain, palpitations, abdominal pain or changes in bowel or bladder habits.  No swelling or tenderness in her extremities. She has occasional numbness and tingling in her hands and feet.  No falls or syncopal episodes.  She has maintained a good appetite with iron rich foods and is staying well hydrated. Her weight is stable.   ECOG Performance Status: 1 - Symptomatic but completely ambulatory  Medications:  Allergies as of 03/13/2018      Reactions   Ciprocinonide [fluocinolone]    Penicillins    Sulfa Antibiotics       Medication List        Accurate as of 03/13/18  2:26 PM. Always use your most recent med list.          cloNIDine 0.2 MG tablet Commonly known as:  CATAPRES Take 1 tablet (0.2 mg total) by mouth 2 (two) times daily.   omeprazole 10 MG capsule Commonly known as:  PRILOSEC Take 20 mg by mouth daily.   ondansetron 4 MG tablet Commonly known as:  ZOFRAN Take 1 tablet (4 mg total) by mouth 2 (two) times daily.   Vitamin D (Ergocalciferol) 50000 units Caps capsule Commonly known as:  DRISDOL       Allergies:  Allergies  Allergen Reactions  . Ciprocinonide [Fluocinolone]   . Penicillins   . Sulfa Antibiotics     Past Medical History, Surgical  history, Social history, and Family History were reviewed and updated.  Review of Systems: All other 10 point review of systems is negative.   Physical Exam:  weight is 181 lb (82.1 kg). Her oral temperature is 97.8 F (36.6 C). Her blood pressure is 149/87 (abnormal) and her pulse is 74. Her respiration is 19 and oxygen saturation is 100%.   Wt Readings from Last 3 Encounters:  03/13/18 181 lb (82.1 kg)  01/30/18 180 lb (81.6 kg)  12/19/17 181 lb (82.1 kg)    Ocular: Sclerae unicteric, pupils equal, round and reactive to light Ear-nose-throat: Oropharynx clear, dentition fair Lymphatic: No cervical, supraclavicular or axillary adenopathy Lungs no rales or rhonchi, good excursion bilaterally Heart regular rate and rhythm, no murmur appreciated Abd soft, nontender, positive bowel sounds, no liver or spleen tip palpated on exam, no fluid wave  MSK no focal spinal tenderness, no joint edema Neuro: non-focal, well-oriented, appropriate affect Breasts: Deferred   Lab Results  Component Value Date   WBC 4.6 03/13/2018   HGB 12.4 12/19/2017   HCT 42.6 03/13/2018   MCV 88.6 03/13/2018   PLT 270 03/13/2018   Lab Results  Component Value Date   FERRITIN 246 01/30/2018   IRON 49 01/30/2018   TIBC 251 01/30/2018   UIBC 202 01/30/2018   IRONPCTSAT 20 (L) 01/30/2018   Lab Results  Component Value Date   RETICCTPCT 2.3 (H) 01/30/2018   RBC 4.81 03/13/2018   RETICCTABS 139.2 12/12/2015   No results found for: KPAFRELGTCHN, LAMBDASER, KAPLAMBRATIO No results found for: Kandis Cocking, [facility] Lab Results  Component Value Date   TOTALPROTELP 6.8 02/28/2012   ALBUMINELP 57.8 02/28/2012   A1GS 4.0 02/28/2012   A2GS 11.7 02/28/2012   BETS 6.7 02/28/2012   BETA2SER 5.2 02/28/2012   GAMS 14.6 02/28/2012   MSPIKE NOT DET 02/28/2012   SPEI * 02/28/2012     Chemistry      Component Value Date/Time   NA 144 01/30/2018 1357   NA 148 (H) 12/19/2017 1355   NA 143 06/28/2017 1114     K 4.8 01/30/2018 1357   K 4.1 12/19/2017 1355   K 4.0 06/28/2017 1114   CL 109 01/30/2018 1357   CL 108 12/19/2017 1355   CO2 26 01/30/2018 1357   CO2 27 12/19/2017 1355   CO2 23 06/28/2017 1114   BUN 14 01/30/2018 1357   BUN 12 12/19/2017 1355   BUN 11.0 06/28/2017 1114   CREATININE 0.77 01/30/2018 1357   CREATININE 0.9 12/19/2017 1355   CREATININE 0.7 06/28/2017 1114      Component Value Date/Time   CALCIUM 9.9 01/30/2018 1357   CALCIUM 9.5 12/19/2017 1355   CALCIUM 9.5 06/28/2017 1114   ALKPHOS 97 01/30/2018 1357   ALKPHOS 88 (H) 12/19/2017 1355   ALKPHOS 88 06/28/2017 1114   AST 16 01/30/2018 1357   AST 16 06/28/2017 1114   ALT 13 01/30/2018 1357   ALT 16 12/19/2017 1355   ALT 13 06/28/2017 1114   BILITOT 0.2 01/30/2018 1357   BILITOT 0.30 06/28/2017 1114      Impression and Plan: Jamie Mcdowell is a very pleasant 57 yo caucasian female with iron deficiency anemia. She is symptomatic with fatigue, SOB, dizziness and palpitations.  Unfortunately, the financial portion of her visits is causing a good bit of stress. She will stop and see Baxter Flattery on her way out to discuss possible financial aid.  We will see what her iron studies show and go from there.  We will do labs every 3 months and then plan to see her back for follow-up in 6 months. She is in agreement with the plan.  She will contact our office with any questions or concerns. We can certainly see her sooner if need be.    [name], NP 3/21/20192:26 PM

## 2018-03-14 ENCOUNTER — Other Ambulatory Visit: Payer: Self-pay | Admitting: Family

## 2018-03-14 ENCOUNTER — Telehealth: Payer: Self-pay | Admitting: *Deleted

## 2018-03-14 DIAGNOSIS — D500 Iron deficiency anemia secondary to blood loss (chronic): Secondary | ICD-10-CM

## 2018-03-14 LAB — IRON AND TIBC
IRON: 56 ug/dL (ref 41–142)
SATURATION RATIOS: 22 % (ref 21–57)
TIBC: 254 ug/dL (ref 236–444)
UIBC: 198 ug/dL

## 2018-03-14 LAB — VITAMIN D 25 HYDROXY (VIT D DEFICIENCY, FRACTURES): VIT D 25 HYDROXY: 23.3 ng/mL — AB (ref 30.0–100.0)

## 2018-03-14 LAB — FERRITIN: FERRITIN: 262 ng/mL (ref 9–269)

## 2018-03-14 MED ORDER — VITAMIN D (ERGOCALCIFEROL) 1.25 MG (50000 UNIT) PO CAPS: 50000.0000 [IU] | ORAL_CAPSULE | ORAL | 6 refills | Status: DC

## 2018-03-14 NOTE — Telephone Encounter (Addendum)
Patient is aware of results and new prescription   ----- Message from [name], NP sent at 03/14/2018  7:59 AM EDT ----- Regarding: vit D Vitamin D low, reordered her supplement. We discussed her restarting it yesterday at her visit if needed. Thank you!  Sarah  ----- Message ----- From: Buel Ream, Lab In [place] Sent: 03/13/2018   1:38 PM To: [name], NP

## 2018-03-21 ENCOUNTER — Other Ambulatory Visit: Payer: Self-pay | Admitting: Family Medicine

## 2018-03-21 DIAGNOSIS — Z1231 Encounter for screening mammogram for malignant neoplasm of breast: Secondary | ICD-10-CM

## 2018-04-24 ENCOUNTER — Ambulatory Visit
Admission: RE | Admit: 2018-04-24 | Discharge: 2018-04-24 | Disposition: A | Discharge status: Home / Self Care | Payer: Managed Care, Other (non HMO) | Source: Ambulatory Visit | Attending: Family Medicine | Admitting: Family Medicine

## 2018-04-24 DIAGNOSIS — Z1231 Encounter for screening mammogram for malignant neoplasm of breast: Secondary | ICD-10-CM

## 2018-04-28 ENCOUNTER — Other Ambulatory Visit: Payer: Self-pay | Admitting: Family Medicine

## 2018-04-28 DIAGNOSIS — R928 Other abnormal and inconclusive findings on diagnostic imaging of breast: Secondary | ICD-10-CM

## 2018-04-30 ENCOUNTER — Ambulatory Visit
Admission: RE | Admit: 2018-04-30 | Discharge: 2018-04-30 | Disposition: A | Discharge status: Home / Self Care | Payer: Managed Care, Other (non HMO) | Source: Ambulatory Visit | Attending: Family Medicine | Admitting: Family Medicine

## 2018-04-30 ENCOUNTER — Ambulatory Visit: Payer: Self-pay

## 2018-04-30 DIAGNOSIS — R928 Other abnormal and inconclusive findings on diagnostic imaging of breast: Secondary | ICD-10-CM

## 2018-05-01 ENCOUNTER — Inpatient Hospital Stay: Payer: Managed Care, Other (non HMO) | Attending: Hematology & Oncology

## 2018-05-01 DIAGNOSIS — D509 Iron deficiency anemia, unspecified: Secondary | ICD-10-CM | POA: Insufficient documentation

## 2018-05-01 DIAGNOSIS — E559 Vitamin D deficiency, unspecified: Secondary | ICD-10-CM

## 2018-05-01 DIAGNOSIS — D500 Iron deficiency anemia secondary to blood loss (chronic): Secondary | ICD-10-CM

## 2018-05-01 LAB — RETICULOCYTES
RBC.: 4.14 MIL/uL (ref 3.70–5.45)
RETIC COUNT ABSOLUTE: 207 10*3/uL — AB (ref 33.7–90.7)
Retic Ct Pct: 5 % — ABNORMAL HIGH (ref 0.7–2.1)

## 2018-05-01 LAB — CMP (CANCER CENTER ONLY)
ALT: 15 U/L (ref 0–55)
ANION GAP: 9 (ref 3–11)
AST: 16 U/L (ref 5–34)
Albumin: 4.1 g/dL (ref 3.5–5.0)
Alkaline Phosphatase: 91 U/L (ref 40–150)
BUN: 13 mg/dL (ref 7–26)
CALCIUM: 9.8 mg/dL (ref 8.4–10.4)
CHLORIDE: 108 mmol/L (ref 98–109)
CO2: 26 mmol/L (ref 22–29)
Creatinine: 0.83 mg/dL (ref 0.60–1.10)
GFR, Estimated: >60 mL/min (ref >60)
Glucose, Bld: 92 mg/dL (ref 70–140)
POTASSIUM: 3.9 mmol/L (ref 3.5–5.1)
SODIUM: 143 mmol/L (ref 136–145)
Total Bilirubin: 0.3 mg/dL (ref 0.2–1.2)
Total Protein: 7.2 g/dL (ref 6.4–8.3)

## 2018-05-01 LAB — CBC WITH DIFFERENTIAL (CANCER CENTER ONLY)
Basophils Absolute: 0 10*3/uL (ref 0.0–0.1)
Basophils Relative: 0 %
EOS PCT: 2 %
Eosinophils Absolute: 0.1 10*3/uL (ref 0.0–0.5)
HCT: 38.5 % (ref 34.8–46.6)
Hemoglobin: 12.6 g/dL (ref 11.6–15.9)
LYMPHS ABS: 1.5 10*3/uL (ref 0.9–3.3)
Lymphocytes Relative: 33 %
MCH: 29.8 pg (ref 26.0–34.0)
MCHC: 32.7 g/dL (ref 32.0–36.0)
MCV: 91 fL (ref 81.0–101.0)
MONOS PCT: 7 %
Monocytes Absolute: 0.3 10*3/uL (ref 0.1–0.9)
Neutro Abs: 2.6 10*3/uL (ref 1.5–6.5)
Neutrophils Relative %: 58 %
PLATELETS: 289 10*3/uL (ref 145–400)
RBC: 4.23 MIL/uL (ref 3.70–5.32)
RDW: 13.7 % (ref 11.1–15.7)
WBC Count: 4.6 10*3/uL (ref 3.9–10.0)

## 2018-05-02 ENCOUNTER — Telehealth: Payer: Self-pay | Admitting: *Deleted

## 2018-05-02 LAB — IRON AND TIBC
IRON: 46 ug/dL (ref 41–142)
Saturation Ratios: 17 % — ABNORMAL LOW (ref 21–57)
TIBC: 270 ug/dL (ref 236–444)
UIBC: 224 ug/dL

## 2018-05-02 LAB — FERRITIN: Ferritin: 139 ng/mL (ref 9–269)

## 2018-05-02 LAB — VITAMIN D 25 HYDROXY (VIT D DEFICIENCY, FRACTURES): Vit D, 25-Hydroxy: 26.1 ng/mL — ABNORMAL LOW (ref 30.0–100.0)

## 2018-05-02 NOTE — Telephone Encounter (Addendum)
Patient aware of results. Appointment made  ----- Message from [name], NP sent at 05/02/2018 11:27 AM EDT ----- Needs 1 dose of IV iron. Scheduling message was sent to [facility]. Continue on Vitamin D supplement. Thank you!   ----- Message ----- From: Interface, Lab In [place] Sent: 05/01/2018   2:20 PM To: [name], NP

## 2018-05-06 ENCOUNTER — Other Ambulatory Visit: Payer: Self-pay

## 2018-05-06 ENCOUNTER — Inpatient Hospital Stay: Payer: Managed Care, Other (non HMO)

## 2018-05-06 VITALS — BP 151/75 | HR 62 | Temp 97.9°F | Resp 18

## 2018-05-06 DIAGNOSIS — D500 Iron deficiency anemia secondary to blood loss (chronic): Secondary | ICD-10-CM

## 2018-05-06 DIAGNOSIS — D509 Iron deficiency anemia, unspecified: Secondary | ICD-10-CM | POA: Diagnosis not present

## 2018-05-06 MED ORDER — SODIUM CHLORIDE 0.9 % IV SOLN
510.0000 mg | Freq: Once | INTRAVENOUS | Status: AC
  Administered 2018-05-06: 510 mg via INTRAVENOUS
  Filled 2018-05-06: qty 17

## 2018-05-06 NOTE — Patient Instructions (Signed)

## 2018-06-04 ENCOUNTER — Encounter (HOSPITAL_BASED_OUTPATIENT_CLINIC_OR_DEPARTMENT_OTHER): Payer: Self-pay | Admitting: Emergency Medicine

## 2018-06-04 ENCOUNTER — Other Ambulatory Visit: Payer: Self-pay

## 2018-06-04 ENCOUNTER — Emergency Department (HOSPITAL_BASED_OUTPATIENT_CLINIC_OR_DEPARTMENT_OTHER): Payer: Managed Care, Other (non HMO)

## 2018-06-04 ENCOUNTER — Inpatient Hospital Stay (HOSPITAL_BASED_OUTPATIENT_CLINIC_OR_DEPARTMENT_OTHER)
Admission: EM | Admit: 2018-06-04 | Discharge: 2018-06-08 | DRG: 536 | Disposition: A | Discharge status: Home Health | Payer: Managed Care, Other (non HMO) | Attending: Internal Medicine | Admitting: Internal Medicine

## 2018-06-04 DIAGNOSIS — W19XXXA Unspecified fall, initial encounter: Secondary | ICD-10-CM

## 2018-06-04 DIAGNOSIS — S92502A Displaced unspecified fracture of left lesser toe(s), initial encounter for closed fracture: Secondary | ICD-10-CM | POA: Diagnosis present

## 2018-06-04 DIAGNOSIS — S32512A Fracture of superior rim of left pubis, initial encounter for closed fracture: Secondary | ICD-10-CM

## 2018-06-04 DIAGNOSIS — Z881 Allergy status to other antibiotic agents status: Secondary | ICD-10-CM

## 2018-06-04 DIAGNOSIS — E669 Obesity, unspecified: Secondary | ICD-10-CM | POA: Diagnosis present

## 2018-06-04 DIAGNOSIS — Z6830 Body mass index (BMI) 30.0-30.9, adult: Secondary | ICD-10-CM

## 2018-06-04 DIAGNOSIS — M25552 Pain in left hip: Secondary | ICD-10-CM | POA: Diagnosis not present

## 2018-06-04 DIAGNOSIS — Z882 Allergy status to sulfonamides status: Secondary | ICD-10-CM

## 2018-06-04 DIAGNOSIS — I10 Essential (primary) hypertension: Secondary | ICD-10-CM | POA: Diagnosis present

## 2018-06-04 DIAGNOSIS — E876 Hypokalemia: Secondary | ICD-10-CM | POA: Diagnosis not present

## 2018-06-04 DIAGNOSIS — Z87891 Personal history of nicotine dependence: Secondary | ICD-10-CM

## 2018-06-04 DIAGNOSIS — S329XXA Fracture of unspecified parts of lumbosacral spine and pelvis, initial encounter for closed fracture: Secondary | ICD-10-CM | POA: Diagnosis present

## 2018-06-04 DIAGNOSIS — S92521A Displaced fracture of medial phalanx of right lesser toe(s), initial encounter for closed fracture: Secondary | ICD-10-CM

## 2018-06-04 DIAGNOSIS — R339 Retention of urine, unspecified: Secondary | ICD-10-CM | POA: Diagnosis not present

## 2018-06-04 DIAGNOSIS — S00419A Abrasion of unspecified ear, initial encounter: Secondary | ICD-10-CM | POA: Diagnosis present

## 2018-06-04 DIAGNOSIS — D509 Iron deficiency anemia, unspecified: Secondary | ICD-10-CM | POA: Diagnosis present

## 2018-06-04 DIAGNOSIS — H8110 Benign paroxysmal vertigo, unspecified ear: Secondary | ICD-10-CM | POA: Diagnosis not present

## 2018-06-04 DIAGNOSIS — S92512A Displaced fracture of proximal phalanx of left lesser toe(s), initial encounter for closed fracture: Secondary | ICD-10-CM | POA: Diagnosis present

## 2018-06-04 DIAGNOSIS — R338 Other retention of urine: Secondary | ICD-10-CM | POA: Diagnosis not present

## 2018-06-04 DIAGNOSIS — D500 Iron deficiency anemia secondary to blood loss (chronic): Secondary | ICD-10-CM

## 2018-06-04 DIAGNOSIS — S32592A Other specified fracture of left pubis, initial encounter for closed fracture: Secondary | ICD-10-CM | POA: Diagnosis not present

## 2018-06-04 DIAGNOSIS — Z880 Allergy status to penicillin: Secondary | ICD-10-CM

## 2018-06-04 DIAGNOSIS — W109XXA Fall (on) (from) unspecified stairs and steps, initial encounter: Secondary | ICD-10-CM | POA: Diagnosis present

## 2018-06-04 MED ORDER — KETOROLAC TROMETHAMINE 30 MG/ML IJ SOLN
30.0000 mg | Freq: Once | INTRAMUSCULAR | Status: AC
  Administered 2018-06-04: 30 mg via INTRAVENOUS
  Filled 2018-06-04: qty 1

## 2018-06-04 MED ORDER — KETOROLAC TROMETHAMINE 60 MG/2ML IM SOLN
30.0000 mg | Freq: Once | INTRAMUSCULAR | Status: DC
  Filled 2018-06-04: qty 2

## 2018-06-04 MED ORDER — KETOROLAC TROMETHAMINE 30 MG/ML IJ SOLN
INTRAMUSCULAR | Status: AC
  Filled 2018-06-04: qty 1

## 2018-06-04 NOTE — ED Notes (Signed)
Patient transported to X-ray 

## 2018-06-04 NOTE — ED Triage Notes (Signed)
Patient states fall in garage on steps while taking trash out. Complains of left hip and right knee pain. Denies LOC.

## 2018-06-04 NOTE — ED Provider Notes (Addendum)
[facility] EMERGENCY DEPARTMENT Provider Note   CSN: [id number] Arrival date & time: 06/04/18  2314     History   Chief Complaint Chief Complaint  Patient presents with  . Fall    HPI Stephannie H Criscuolo is a 57 y.o. female.  The history is provided by the patient.  Fall  This is a new problem. The current episode started 1 to 2 hours ago. The problem occurs constantly. The problem has not changed since onset.Pertinent negatives include no chest pain, no abdominal pain, no headaches and no shortness of breath. Nothing aggravates the symptoms. Nothing relieves the symptoms. She has tried nothing for the symptoms. The treatment provided no relief.  Patient complains of left hip and toe pain.  Did hit head, no LOC.  Given Fentanyl en route and this has not helped.    Past Medical History:  Diagnosis Date  . Anemia   . Hypertension     Patient Active Problem List   Diagnosis Date Noted  . HTN (hypertension) 03/12/2013  . Iron deficiency anemia 10/20/2011    History reviewed. No pertinent surgical history.   OB History   None      Home Medications    Prior to Admission medications   Medication Sig Start Date End Date Taking? Authorizing Provider  cloNIDine (CATAPRES) 0.2 MG tablet Take 1 tablet (0.2 mg total) by mouth 2 (two) times daily. 06/02/15   Volanda Napoleon, MD  omeprazole (PRILOSEC) 10 MG capsule Take 20 mg by mouth daily.     [provider]  ondansetron (ZOFRAN) 4 MG tablet Take 1 tablet (4 mg total) by mouth 2 (two) times daily. 01/17/17   [place], Holli Humbles, NP  Vitamin D, Ergocalciferol, (DRISDOL) 50000 units CAPS capsule Take 1 capsule (50,000 Units total) by mouth every 7 (seven) days. 03/14/18   [place], Holli Humbles, NP    Family History No family history on file.  Social History Social History   Tobacco Use  . Smoking status: Former Smoker    Packs/day: 0.50    Years: 8.00    Pack years: 4.00    Types: Cigarettes    Start date:  01/19/1975    Last attempt to quit: 01/19/1983    Years since quitting: 35.3  . Smokeless tobacco: Never Used  . Tobacco comment: quit 31 years ago  Substance Use Topics  . Alcohol use: No    Alcohol/week: 0.0 oz  . Drug use: Not on file     Allergies   Ciprocinonide [fluocinolone]; Penicillins; and Sulfa antibiotics   Review of Systems Review of Systems  Respiratory: Negative for shortness of breath.   Cardiovascular: Negative for chest pain.  Gastrointestinal: Negative for abdominal pain.  Musculoskeletal: Positive for arthralgias. Negative for back pain, neck pain and neck stiffness.  Neurological: Negative for headaches.  All other systems reviewed and are negative.    Physical Exam Updated Vital Signs BP (!) 146/98 (BP Location: Left Arm)   Pulse 92   Temp 98.4 F (36.9 C) (Oral)   Resp 18   Ht 5\' 4"  (1.626 m)   Wt 82.1 kg (181 lb)   SpO2 100%   BMI 31.07 kg/m   Physical Exam  Constitutional: She appears well-developed and well-nourished. No distress.  HENT:  Head: Normocephalic. Head is without raccoon's eyes and without Battle's sign.    Right Ear: No mastoid tenderness. No hemotympanum.  Left Ear: No mastoid tenderness. No hemotympanum.  Nose: Nose normal.  Mouth/Throat: Oropharynx  is clear and moist.  Eyes: Pupils are equal, round, and reactive to light. Conjunctivae are normal.  Neck: Normal range of motion. Neck supple.  Cardiovascular: Normal rate, regular rhythm, normal heart sounds and intact distal pulses.  Pulmonary/Chest: Effort normal and breath sounds normal. No stridor. She has no wheezes. She has no rales.  Abdominal: Soft. Bowel sounds are normal. She exhibits no mass. There is no tenderness. There is no rebound and no guarding.  Musculoskeletal: She exhibits no deformity.       Cervical back: Normal.       Thoracic back: Normal.       Lumbar back: Normal.  Neurological: She is alert. She displays normal reflexes. She exhibits normal  muscle tone.  Skin: Skin is warm and dry. Capillary refill takes less than 2 seconds.  Psychiatric: She has a normal mood and affect.     ED Treatments / Results  Labs (all labs ordered are listed, but only abnormal results are displayed) Results for orders placed or performed in visit on 05/01/18  CBC with Differential ([facility] Only)  Result Value Ref Range   WBC Count 4.6 3.9 - 10.0 K/uL   RBC 4.23 3.70 - 5.32 MIL/uL   Hemoglobin 12.6 11.6 - 15.9 g/dL   HCT 38.5 34.8 - 46.6 %   MCV 91.0 81.0 - 101.0 fL   MCH 29.8 26.0 - 34.0 pg   MCHC 32.7 32.0 - 36.0 g/dL   RDW 13.7 11.1 - 15.7 %   Platelet Count 289 145 - 400 K/uL   Neutrophils Relative % 58 %   Neutro Abs 2.6 1.5 - 6.5 K/uL   Lymphocytes Relative 33 %   Lymphs Abs 1.5 0.9 - 3.3 K/uL   Monocytes Relative 7 %   Monocytes Absolute 0.3 0.1 - 0.9 K/uL   Eosinophils Relative 2 %   Eosinophils Absolute 0.1 0.0 - 0.5 K/uL   Basophils Relative 0 %   Basophils Absolute 0.0 0.0 - 0.1 K/uL  CMP ([facility] only)  Result Value Ref Range   Sodium 143 136 - 145 mmol/L   Potassium 3.9 3.5 - 5.1 mmol/L   Chloride 108 98 - 109 mmol/L   CO2 26 22 - 29 mmol/L   Glucose, Bld 92 70 - 140 mg/dL   BUN 13 7 - 26 mg/dL   Creatinine 0.83 0.60 - 1.10 mg/dL   Calcium 9.8 8.4 - 10.4 mg/dL   Total Protein 7.2 6.4 - 8.3 g/dL   Albumin 4.1 3.5 - 5.0 g/dL   AST 16 5 - 34 U/L   ALT 15 0 - 55 U/L   Alkaline Phosphatase 91 40 - 150 U/L   Total Bilirubin 0.3 0.2 - 1.2 mg/dL   GFR, Est Non Af Am >60 >60 mL/min   GFR, Est AFR Am >60 >60 mL/min   Anion gap 9 3 - 11  Iron and TIBC  Result Value Ref Range   Iron 46 41 - 142 ug/dL   TIBC 270 236 - 444 ug/dL   Saturation Ratios 17 (L) 21 - 57 %   UIBC 224 ug/dL  Ferritin  Result Value Ref Range   Ferritin 139 9 - 269 ng/mL  Reticulocytes  Result Value Ref Range   Retic Ct Pct 5.0 (H) 0.7 - 2.1 %   RBC. 4.14 3.70 - 5.45 MIL/uL   Retic Count, Absolute 207.0 (H) 33.7 - 90.7 K/uL    Vitamin D 25 hydroxy  Result Value Ref Range  Vit D, 25-Hydroxy 26.1 (L) 30.0 - 100.0 ng/mL   Dg Chest 2 View  Result Date: 06/05/2018 CLINICAL DATA:  Fall down steps.  Left groin pain. EXAM: CHEST - 2 VIEW COMPARISON:  Radiograph 06/23/2017 FINDINGS: The cardiomediastinal contours are normal. Moderate to large hiatal hernia. Pulmonary vasculature is normal. No consolidation, pleural effusion, or pneumothorax. No acute osseous abnormalities are seen. IMPRESSION: 1. No acute chest findings. 2. Hiatal hernia. Electronically Signed   By: [name] M.D.   On: 06/05/2018 00:17   Dg Foot 2 Views Left  Result Date: 06/05/2018 CLINICAL DATA:  Fall with left second toe pain EXAM: LEFT FOOT - 2 VIEW COMPARISON:  None. FINDINGS: There is a minimally displaced, oblique fracture of the midshaft of the left second proximal phalanx. This is best demonstrated on the lateral projection. No other fracture or dislocation. IMPRESSION: Minimally displaced oblique fracture of the proximal phalanx of the left second toe. Electronically Signed   By: Ulyses Jarred M.D.   On: 06/05/2018 00:44   Dg Hip Unilat With Pelvis 2-3 Views Left  Result Date: 06/05/2018 CLINICAL DATA:  Fall down stairs with left groin pain. Initial encounter. EXAM: DG HIP (WITH OR WITHOUT PELVIS) 2-3V LEFT COMPARISON:  None. FINDINGS: Displaced mildly comminuted left inferior pubic ramus fracture. Minimally displaced superior pubic ramus fracture at the puboacetabular junction. Pubic symphysis remains congruent. No additional acute fracture of the pelvis or left. IMPRESSION: Left superior and inferior pubic rami fractures, inferior ramus fracture is displaced and comminuted. Electronically Signed   By: [name] M.D.   On: 06/05/2018 00:16    Radiology No results found.  Procedures Procedures (including critical care time)  Medications Ordered in ED Medications  ondansetron (ZOFRAN) injection 4 mg (has no administration in time  range)  ketorolac (TORADOL) 30 MG/ML injection 30 mg (30 mg Intravenous Given 06/04/18 2330)  morphine 4 MG/ML injection 4 mg (4 mg Intravenous Given 06/05/18 0047)   The area on the ear is abraded off.  Attempting to close it will pucker the the helix of the ear.  Wound sealer applied and ooze stopped.  Will steri strip over the area and apply bulky dressing for comfort.    Case d/w Dr. [name] of ortho.  Admit to medicine for pain control.  No need for intervention at this time   Final Clinical Impressions(s) / ED Diagnoses   Final diagnoses:  Fall, initial encounter   Will admit for pain control as patient is not improving with medication.      Azekiel Cremer, MD 06/05/18 Gifford Shave, Laurance Heide, MD 06/05/18 8280    Veatrice Kells, MD 06/05/18 0349

## 2018-06-05 ENCOUNTER — Observation Stay (HOSPITAL_BASED_OUTPATIENT_CLINIC_OR_DEPARTMENT_OTHER): Payer: Managed Care, Other (non HMO)

## 2018-06-05 ENCOUNTER — Inpatient Hospital Stay: Payer: Managed Care, Other (non HMO)

## 2018-06-05 ENCOUNTER — Encounter (HOSPITAL_BASED_OUTPATIENT_CLINIC_OR_DEPARTMENT_OTHER): Payer: Self-pay | Admitting: Emergency Medicine

## 2018-06-05 ENCOUNTER — Emergency Department (HOSPITAL_BASED_OUTPATIENT_CLINIC_OR_DEPARTMENT_OTHER): Payer: Managed Care, Other (non HMO)

## 2018-06-05 DIAGNOSIS — E876 Hypokalemia: Secondary | ICD-10-CM | POA: Diagnosis not present

## 2018-06-05 DIAGNOSIS — S92512A Displaced fracture of proximal phalanx of left lesser toe(s), initial encounter for closed fracture: Secondary | ICD-10-CM | POA: Diagnosis not present

## 2018-06-05 DIAGNOSIS — Z6830 Body mass index (BMI) 30.0-30.9, adult: Secondary | ICD-10-CM | POA: Diagnosis not present

## 2018-06-05 DIAGNOSIS — M25552 Pain in left hip: Secondary | ICD-10-CM | POA: Diagnosis present

## 2018-06-05 DIAGNOSIS — D509 Iron deficiency anemia, unspecified: Secondary | ICD-10-CM | POA: Diagnosis not present

## 2018-06-05 DIAGNOSIS — Z87891 Personal history of nicotine dependence: Secondary | ICD-10-CM | POA: Diagnosis not present

## 2018-06-05 DIAGNOSIS — S00419A Abrasion of unspecified ear, initial encounter: Secondary | ICD-10-CM | POA: Diagnosis not present

## 2018-06-05 DIAGNOSIS — R338 Other retention of urine: Secondary | ICD-10-CM | POA: Diagnosis not present

## 2018-06-05 DIAGNOSIS — Z880 Allergy status to penicillin: Secondary | ICD-10-CM | POA: Diagnosis not present

## 2018-06-05 DIAGNOSIS — S92521A Displaced fracture of medial phalanx of right lesser toe(s), initial encounter for closed fracture: Secondary | ICD-10-CM

## 2018-06-05 DIAGNOSIS — S32592A Other specified fracture of left pubis, initial encounter for closed fracture: Secondary | ICD-10-CM | POA: Diagnosis present

## 2018-06-05 DIAGNOSIS — W19XXXA Unspecified fall, initial encounter: Secondary | ICD-10-CM | POA: Diagnosis not present

## 2018-06-05 DIAGNOSIS — Z882 Allergy status to sulfonamides status: Secondary | ICD-10-CM | POA: Diagnosis not present

## 2018-06-05 DIAGNOSIS — R339 Retention of urine, unspecified: Secondary | ICD-10-CM | POA: Diagnosis not present

## 2018-06-05 DIAGNOSIS — W109XXA Fall (on) (from) unspecified stairs and steps, initial encounter: Secondary | ICD-10-CM | POA: Diagnosis not present

## 2018-06-05 DIAGNOSIS — S329XXA Fracture of unspecified parts of lumbosacral spine and pelvis, initial encounter for closed fracture: Secondary | ICD-10-CM | POA: Diagnosis present

## 2018-06-05 DIAGNOSIS — H8110 Benign paroxysmal vertigo, unspecified ear: Secondary | ICD-10-CM | POA: Diagnosis not present

## 2018-06-05 DIAGNOSIS — I10 Essential (primary) hypertension: Secondary | ICD-10-CM | POA: Diagnosis not present

## 2018-06-05 DIAGNOSIS — S32512A Fracture of superior rim of left pubis, initial encounter for closed fracture: Secondary | ICD-10-CM | POA: Diagnosis not present

## 2018-06-05 DIAGNOSIS — S92502A Displaced unspecified fracture of left lesser toe(s), initial encounter for closed fracture: Secondary | ICD-10-CM | POA: Diagnosis present

## 2018-06-05 DIAGNOSIS — Z881 Allergy status to other antibiotic agents status: Secondary | ICD-10-CM | POA: Diagnosis not present

## 2018-06-05 DIAGNOSIS — E669 Obesity, unspecified: Secondary | ICD-10-CM | POA: Diagnosis not present

## 2018-06-05 LAB — BASIC METABOLIC PANEL
ANION GAP: 7 (ref 5–15)
BUN: 18 mg/dL (ref 6–20)
CALCIUM: 8.5 mg/dL — AB (ref 8.9–10.3)
CHLORIDE: 112 mmol/L — AB (ref 101–111)
CO2: 22 mmol/L (ref 22–32)
Creatinine, Ser: 0.8 mg/dL (ref 0.44–1.00)
GFR calc non Af Amer: >60 mL/min (ref >60)
GLUCOSE: 106 mg/dL — AB (ref 65–99)
POTASSIUM: 3.4 mmol/L — AB (ref 3.5–5.1)
Sodium: 141 mmol/L (ref 135–145)

## 2018-06-05 LAB — CBC WITH DIFFERENTIAL/PLATELET
BASOS ABS: 0 10*3/uL (ref 0.0–0.1)
BASOS PCT: 0 %
Eosinophils Absolute: 0 10*3/uL (ref 0.0–0.7)
Eosinophils Relative: 0 %
HEMATOCRIT: 31 % — AB (ref 36.0–46.0)
HEMOGLOBIN: 10.1 g/dL — AB (ref 12.0–15.0)
LYMPHS PCT: 10 %
Lymphs Abs: 1 10*3/uL (ref 0.7–4.0)
MCH: 30.4 pg (ref 26.0–34.0)
MCHC: 32.6 g/dL (ref 30.0–36.0)
MCV: 93.4 fL (ref 78.0–100.0)
MONO ABS: 0.5 10*3/uL (ref 0.1–1.0)
Monocytes Relative: 5 %
NEUTROS ABS: 8.6 10*3/uL — AB (ref 1.7–7.7)
NEUTROS PCT: 85 %
Platelets: 313 10*3/uL (ref 150–400)
RBC: 3.32 MIL/uL — AB (ref 3.87–5.11)
RDW: 14.5 % (ref 11.5–15.5)
WBC: 10.2 10*3/uL (ref 4.0–10.5)

## 2018-06-05 LAB — HIV ANTIBODY (ROUTINE TESTING W REFLEX): HIV Screen 4th Generation wRfx: NONREACTIVE

## 2018-06-05 MED ORDER — CLONIDINE HCL 0.2 MG PO TABS
0.2000 mg | ORAL_TABLET | Freq: Every day | ORAL | Status: DC
  Administered 2018-06-05 – 2018-06-07 (×3): 0.2 mg via ORAL
  Filled 2018-06-05 (×3): qty 1

## 2018-06-05 MED ORDER — "THROMBI-PAD 3""X3"" EX PADS"
MEDICATED_PAD | CUTANEOUS | Status: AC
  Filled 2018-06-05: qty 1

## 2018-06-05 MED ORDER — POTASSIUM CHLORIDE CRYS ER 20 MEQ PO TBCR
20.0000 meq | EXTENDED_RELEASE_TABLET | Freq: Two times a day (BID) | ORAL | Status: AC
  Administered 2018-06-05 (×2): 20 meq via ORAL
  Filled 2018-06-05 (×2): qty 1

## 2018-06-05 MED ORDER — ONDANSETRON HCL 4 MG PO TABS
4.0000 mg | ORAL_TABLET | Freq: Four times a day (QID) | ORAL | Status: DC | PRN
  Administered 2018-06-05 – 2018-06-08 (×4): 4 mg via ORAL
  Filled 2018-06-05 (×5): qty 1

## 2018-06-05 MED ORDER — MORPHINE SULFATE (PF) 4 MG/ML IV SOLN
4.0000 mg | Freq: Once | INTRAVENOUS | Status: AC
  Administered 2018-06-05: 4 mg via INTRAVENOUS

## 2018-06-05 MED ORDER — MORPHINE SULFATE (PF) 4 MG/ML IV SOLN
4.0000 mg | INTRAVENOUS | Status: DC | PRN
  Administered 2018-06-05: 4 mg via INTRAVENOUS
  Filled 2018-06-05: qty 1

## 2018-06-05 MED ORDER — ONDANSETRON HCL 4 MG/2ML IJ SOLN
4.0000 mg | Freq: Four times a day (QID) | INTRAMUSCULAR | Status: DC | PRN
  Administered 2018-06-05 – 2018-06-08 (×4): 4 mg via INTRAVENOUS
  Filled 2018-06-05 (×5): qty 2

## 2018-06-05 MED ORDER — PANTOPRAZOLE SODIUM 40 MG PO TBEC
40.0000 mg | DELAYED_RELEASE_TABLET | Freq: Every day | ORAL | Status: DC
  Administered 2018-06-05 – 2018-06-08 (×4): 40 mg via ORAL
  Filled 2018-06-05 (×4): qty 1

## 2018-06-05 MED ORDER — ONDANSETRON HCL 4 MG/2ML IJ SOLN
4.0000 mg | Freq: Once | INTRAMUSCULAR | Status: AC
  Administered 2018-06-05: 4 mg via INTRAVENOUS
  Filled 2018-06-05: qty 2

## 2018-06-05 MED ORDER — ACETAMINOPHEN 650 MG RE SUPP: 650.0000 mg | Freq: Four times a day (QID) | RECTAL | Status: DC | PRN

## 2018-06-05 MED ORDER — MORPHINE SULFATE (PF) 4 MG/ML IV SOLN
4.0000 mg | Freq: Once | INTRAVENOUS | Status: AC
  Administered 2018-06-05: 4 mg via INTRAVENOUS
  Filled 2018-06-05: qty 1

## 2018-06-05 MED ORDER — CLONIDINE HCL 0.1 MG PO TABS
0.1000 mg | ORAL_TABLET | Freq: Every day | ORAL | Status: DC
  Administered 2018-06-05 – 2018-06-07 (×3): 0.1 mg via ORAL
  Filled 2018-06-05 (×4): qty 1

## 2018-06-05 MED ORDER — KETOROLAC TROMETHAMINE 30 MG/ML IJ SOLN
30.0000 mg | Freq: Four times a day (QID) | INTRAMUSCULAR | Status: DC
  Administered 2018-06-05 – 2018-06-06 (×5): 30 mg via INTRAVENOUS
  Filled 2018-06-05 (×5): qty 1

## 2018-06-05 MED ORDER — MORPHINE SULFATE (PF) 4 MG/ML IV SOLN
INTRAVENOUS | Status: AC
  Filled 2018-06-05: qty 1

## 2018-06-05 MED ORDER — ACETAMINOPHEN 325 MG PO TABS
650.0000 mg | ORAL_TABLET | Freq: Four times a day (QID) | ORAL | Status: DC | PRN
  Administered 2018-06-05 (×2): 650 mg via ORAL
  Filled 2018-06-05 (×2): qty 2

## 2018-06-05 MED ORDER — VITAMIN D (ERGOCALCIFEROL) 1.25 MG (50000 UNIT) PO CAPS
50000.0000 [IU] | ORAL_CAPSULE | ORAL | Status: DC
  Administered 2018-06-05: 50000 [IU] via ORAL
  Filled 2018-06-05: qty 1

## 2018-06-05 MED ORDER — ENOXAPARIN SODIUM 40 MG/0.4ML ~~LOC~~ SOLN
40.0000 mg | Freq: Every day | SUBCUTANEOUS | Status: DC
  Administered 2018-06-06 – 2018-06-08 (×3): 40 mg via SUBCUTANEOUS
  Filled 2018-06-05 (×4): qty 0.4

## 2018-06-05 NOTE — Progress Notes (Signed)
Progress Note    Jamie Mcdowell  DJS:[id number] DOB: March 15, 1961  DOA: 06/04/2018 PCP: Carol Ada, MD    Brief Narrative:   Chief complaint: F/U pelvic/toe fx  Medical records reviewed and are as summarized below:  Jamie Mcdowell is an 57 y.o. female with a PMH of hypertension who fell down the stairs resulting in left superior/inferior pubic rami fractures and a left second toe fracture.  Assessment/Plan:   Principal Problem:   Closed fracture of multiple pubic rami, left,/pelvic fracture closed fracture of phalanx of left second toe Plain films personally reviewed. None of her fractures are surgical.  Dr. [name] consulted.  Left toe splinted.  Pain control with morphine and Toradol.  PT/OT.  Active Problems:   Urinary retention Likely related to pelvic swelling/pain/injury. Will bladder scan.  I&O cath Q 6 hours PRN.    Hypokalemia Will replace.    HTN (hypertension) Continue clonidine.  Blood pressure currently controlled.    Obesity, BMI 30.0-30.9, adult Body mass index is 30.92 kg/m.   Family Communication/Anticipated D/C date and plan/Code Status   DVT prophylaxis: Lovenox ordered. Code Status: Full Code.  Family Communication: No family at the bedside.  Declines my offer to call. Disposition Plan: Likely will need SNF for rehab.   Medical Consultants:    Dr. [name], Orthopedics.   Anti-Infectives:    None  Subjective:   Tearful, discouraged, having some pelvic pain. Reports that she has not voided overnight or this morning.  Objective:    Vitals:   06/04/18 2330 06/04/18 2331 06/05/18 0228 06/05/18 0430  BP: (!) 146/98  (!) 151/94 137/81  Pulse: 92  82 70  Resp: 18  18 18   Temp: 98.4 F (36.9 C)  98.4 F (36.9 C) 98.2 F (36.8 C)  TempSrc: Oral  Oral Oral  SpO2: 100%  100% 100%  Weight:  82.1 kg (181 lb)  81.7 kg (180 lb 1.9 oz)  Height:  5\' 4"  (1.626 m)  5\' 4"  (1.626 m)    Intake/Output Summary (Last 24 hours) at 06/05/2018  0812 Last data filed at 06/05/2018 0053 Gross per 24 hour  Intake -  Output 200 ml  Net -200 ml   Filed Weights   06/04/18 2331 06/05/18 0430  Weight: 82.1 kg (181 lb) 81.7 kg (180 lb 1.9 oz)    Exam: General: Tearful. Cardiovascular: Heart sounds show a regular rate, and rhythm. No gallops or rubs. No murmurs. No JVD. Lungs: Clear to auscultation bilaterally with good air movement. No rales, rhonchi or wheezes. Abdomen: Soft, suprapubic tenderness and bladder distention, normal active bowel sounds. No masses. No hepatosplenomegaly. Neurological: Alert and oriented 3. Moves all extremities 4 with equal strength but pain limits ROM/strength in lower extremities. Cranial nerves II through XII grossly intact. Skin: Warm and dry. No rashes or lesions. Extremities: No clubbing or cyanosis. Trace edema. Pedal pulses 2+. Psychiatric: Mood and affect are tearful/depressed. Insight and judgment are fair.   Data Reviewed:   I have personally reviewed following labs and imaging studies:  Labs: Labs show the following:   Basic Metabolic Panel: Recent Labs  Lab 06/05/18 0033  NA 141  K 3.4*  CL 112*  CO2 22  GLUCOSE 106*  BUN 18  CREATININE 0.80  CALCIUM 8.5*   GFR Estimated Creatinine Clearance: 82.2 mL/min (by C-G formula based on SCr of 0.8 mg/dL).  CBC: Recent Labs  Lab 06/05/18 0033  WBC 10.2  NEUTROABS 8.6*  HGB 10.1*  HCT  31.0*  MCV 93.4  PLT 313    Microbiology No results found for this or any previous visit (from the past 240 hour(s)).  Procedures and diagnostic studies:  Dg Chest 2 View  Result Date: 06/05/2018 CLINICAL DATA:  Fall down steps.  Left groin pain. EXAM: CHEST - 2 VIEW COMPARISON:  Radiograph 06/23/2017 FINDINGS: The cardiomediastinal contours are normal. Moderate to large hiatal hernia. Pulmonary vasculature is normal. No consolidation, pleural effusion, or pneumothorax. No acute osseous abnormalities are seen. IMPRESSION: 1. No acute chest  findings. 2. Hiatal hernia. Electronically Signed   By: [name] M.D.   On: 06/05/2018 00:17   Ct Head Wo Contrast  Result Date: 06/05/2018 CLINICAL DATA:  Fall with headache and nausea EXAM: CT HEAD WITHOUT CONTRAST CT CERVICAL SPINE WITHOUT CONTRAST TECHNIQUE: Multidetector CT imaging of the head and cervical spine was performed following the standard protocol without intravenous contrast. Multiplanar CT image reconstructions of the cervical spine were also generated. COMPARISON:  None. FINDINGS: CT HEAD FINDINGS Brain: There is no mass, hemorrhage or extra-axial collection. The size and configuration of the ventricles and extra-axial CSF spaces are normal. There is no acute or chronic infarction. The brain parenchyma is normal. Vascular: No abnormal hyperdensity of the major intracranial arteries or dural venous sinuses. No intracranial atherosclerosis. Skull: The visualized skull base, calvarium and extracranial soft tissues are normal. Sinuses/Orbits: No fluid levels or advanced mucosal thickening of the visualized paranasal sinuses. No mastoid or middle ear effusion. The orbits are normal. CT CERVICAL SPINE FINDINGS Alignment: Mild grade 1 anterolisthesis at C3-C4, likely due to facet hypertrophy. Facets are aligned. Occipital condyles are normally positioned. Skull base and vertebrae: No acute fracture. Soft tissues and spinal canal: No prevertebral fluid or swelling. No visible canal hematoma. Disc levels: Severe right C2-3 facet hypertrophy. No spinal canal or neural foraminal stenosis. Otherwise mild multilevel degenerative disc disease. Upper chest: No pneumothorax, pulmonary nodule or pleural effusion. Other: Normal visualized paraspinal cervical soft tissues. IMPRESSION: 1. Normal brain. 2. No acute abnormality of the cervical spine. 3. Mild multilevel degenerative disc disease. Electronically Signed   By: Ulyses Jarred M.D.   On: 06/05/2018 00:54   Ct Cervical Spine Wo Contrast  Result  Date: 06/05/2018 CLINICAL DATA:  Fall with headache and nausea EXAM: CT HEAD WITHOUT CONTRAST CT CERVICAL SPINE WITHOUT CONTRAST TECHNIQUE: Multidetector CT imaging of the head and cervical spine was performed following the standard protocol without intravenous contrast. Multiplanar CT image reconstructions of the cervical spine were also generated. COMPARISON:  None. FINDINGS: CT HEAD FINDINGS Brain: There is no mass, hemorrhage or extra-axial collection. The size and configuration of the ventricles and extra-axial CSF spaces are normal. There is no acute or chronic infarction. The brain parenchyma is normal. Vascular: No abnormal hyperdensity of the major intracranial arteries or dural venous sinuses. No intracranial atherosclerosis. Skull: The visualized skull base, calvarium and extracranial soft tissues are normal. Sinuses/Orbits: No fluid levels or advanced mucosal thickening of the visualized paranasal sinuses. No mastoid or middle ear effusion. The orbits are normal. CT CERVICAL SPINE FINDINGS Alignment: Mild grade 1 anterolisthesis at C3-C4, likely due to facet hypertrophy. Facets are aligned. Occipital condyles are normally positioned. Skull base and vertebrae: No acute fracture. Soft tissues and spinal canal: No prevertebral fluid or swelling. No visible canal hematoma. Disc levels: Severe right C2-3 facet hypertrophy. No spinal canal or neural foraminal stenosis. Otherwise mild multilevel degenerative disc disease. Upper chest: No pneumothorax, pulmonary nodule or pleural effusion. Other: Normal  visualized paraspinal cervical soft tissues. IMPRESSION: 1. Normal brain. 2. No acute abnormality of the cervical spine. 3. Mild multilevel degenerative disc disease. Electronically Signed   By: Ulyses Jarred M.D.   On: 06/05/2018 00:54   Dg Knee Complete 4 Views Left  Result Date: 06/05/2018 CLINICAL DATA:  Left knee pain after fall. EXAM: LEFT KNEE - COMPLETE 4+ VIEW COMPARISON:  None. FINDINGS: No evidence  of fracture, dislocation, or joint effusion. Minimal osteoarthritis with peripheral spurring and spurring of the tibial spines. Incidental growth arrest line in the distal femur. Soft tissues are unremarkable. IMPRESSION: Mild degenerative change of the left knee without acute fracture. Electronically Signed   By: [name] M.D.   On: 06/05/2018 03:35   Dg Foot 2 Views Left  Result Date: 06/05/2018 CLINICAL DATA:  Fall with left second toe pain EXAM: LEFT FOOT - 2 VIEW COMPARISON:  None. FINDINGS: There is a minimally displaced, oblique fracture of the midshaft of the left second proximal phalanx. This is best demonstrated on the lateral projection. No other fracture or dislocation. IMPRESSION: Minimally displaced oblique fracture of the proximal phalanx of the left second toe. Electronically Signed   By: Ulyses Jarred M.D.   On: 06/05/2018 00:44   Dg Hip Unilat With Pelvis 2-3 Views Left  Result Date: 06/05/2018 CLINICAL DATA:  Fall down stairs with left groin pain. Initial encounter. EXAM: DG HIP (WITH OR WITHOUT PELVIS) 2-3V LEFT COMPARISON:  None. FINDINGS: Displaced mildly comminuted left inferior pubic ramus fracture. Minimally displaced superior pubic ramus fracture at the puboacetabular junction. Pubic symphysis remains congruent. No additional acute fracture of the pelvis or left. IMPRESSION: Left superior and inferior pubic rami fractures, inferior ramus fracture is displaced and comminuted. Electronically Signed   By: [name] M.D.   On: 06/05/2018 00:16    Medications:   . cloNIDine  0.1 mg Oral Daily  . cloNIDine  0.2 mg Oral QHS  . enoxaparin (LOVENOX) injection  40 mg Subcutaneous Daily  . ketorolac  30 mg Intravenous Q6H  . morphine      . pantoprazole  40 mg Oral Daily  . THROMBI-PAD      . Vitamin D (Ergocalciferol)  50,000 Units Oral Q Thu   Continuous Infusions:   LOS: 0 days   Jacquelynn Cree  [facility] Pager [phone]. If unable to reach  me by pager, please call my cell phone at [phone].  *Please refer to [email or web address], password [email or web address] to get updated schedule on who will round on this patient, as hospitalists switch teams weekly. If 7PM-7AM, please contact night-coverage at [email or web address], password [email or web address] for any overnight needs.  06/05/2018, 8:12 AM

## 2018-06-05 NOTE — ED Notes (Signed)
Female external catheter applied.

## 2018-06-05 NOTE — Evaluation (Signed)
Physical Therapy Evaluation Patient Details Name: Jamie Mcdowell MRN: [id number] DOB: December 06, 1961 Today's Date: 06/05/2018   History of Present Illness  57 yo female admitted with L superior/inferior pubic rami fractures, L toe fx after falling down stairs at home.   Clinical Impression  On eval, pt required Min assist for mobility. She walked ~15 feet with a RW. Moderate pain with activity. Pt was tearful and anxious. Ambulation distance was limited by pain, fatigue. Husband was present during session. Discussed d/c plan-pt plans to return home. Husband stated he could be home with her and assist as needed. Recommend HHPT and home health aide, if possible. Pt will also need a RW, 3n1, and a hospital bed.     Follow Up Recommendations Home health PT;Supervision/Assistance - 24 hour    Equipment Recommendations  Rolling walker with 5" wheels;Hospital bed;3in1 (PT)    Recommendations for Other Services       Precautions / Restrictions Precautions Precautions: Fall Restrictions Weight Bearing Restrictions: No      Mobility  Bed Mobility Overal bed mobility: Needs Assistance Bed Mobility: Supine to Sit     Supine to sit: Min guard;HOB elevated     General bed mobility comments: Increased time. Pt used her UE to assist L LE off bed. Pt c/o some dizziness.   Transfers Overall transfer level: Needs assistance Equipment used: Rolling walker (2 wheeled) Transfers: Sit to/from Stand Sit to Stand: Min assist;From elevated surface         General transfer comment: Assist to rise, stabilize, control descent. VCs safety, technique, hand placement.   Ambulation/Gait Ambulation/Gait assistance: Min assist Gait Distance (Feet): 15 Feet Assistive device: Rolling walker (2 wheeled) Gait Pattern/deviations: Step-to pattern;Antalgic;Decreased stance time - left     General Gait Details: VCs safety, technique, sequence. Very slow gait speed. Distance limited by pain, fatigue.    Stairs            Wheelchair Mobility    Modified Rankin (Stroke Patients Only)       Balance Overall balance assessment: History of Falls;Needs assistance         Standing balance support: Bilateral upper extremity supported Standing balance-Leahy Scale: Poor                               Pertinent Vitals/Pain Pain Assessment: Faces Faces Pain Scale: Hurts whole lot Pain Location: L groing, L toe Pain Descriptors / Indicators: Sore;Sharp;Aching Pain Intervention(s): Limited activity within patient's tolerance;Repositioned    Home Living Family/patient expects to be discharged to:: Private residence Living Arrangements: Spouse/significant other Available Help at Discharge: Family Type of Home: House Home Access: Stairs to enter Entrance Stairs-Rails: Right Entrance Stairs-Number of Steps: 4 Home Layout: Two level;1/2 bath on main level(recliner/couch on 1st level) Home Equipment: None      Prior Function Level of Independence: Independent               Hand Dominance        Extremity/Trunk Assessment   Upper Extremity Assessment Upper Extremity Assessment: Defer to OT evaluation    Lower Extremity Assessment Lower Extremity Assessment: LLE deficits/detail LLE Deficits / Details: Pt able to weightbear but she does c/o sharp groin pain LLE: Unable to fully assess due to pain    Cervical / Trunk Assessment Cervical / Trunk Assessment: Normal  Communication   Communication: No difficulties  Cognition Arousal/Alertness: Awake/alert Behavior During Therapy: Anxious Overall Cognitive Status: Within  Functional Limits for tasks assessed                                        General Comments      Exercises     Assessment/Plan    PT Assessment Patient needs continued PT services  PT Problem List Decreased strength;Decreased balance;Decreased activity tolerance;Decreased range of motion;Decreased  mobility;Pain;Decreased knowledge of use of DME       PT Treatment Interventions DME instruction;Gait training;Functional mobility training;Therapeutic activities;Patient/family education;Balance training;Therapeutic exercise;Stair training    PT Goals (Current goals can be found in the Care Plan section)  Acute Rehab PT Goals Patient Stated Goal: less pain. to heal.  PT Goal Formulation: With patient/family Time For Goal Achievement: 06/19/18 Potential to Achieve Goals: Good    Frequency Min 3X/week   Barriers to discharge        Co-evaluation               AM-PAC PT "6 Clicks" Daily Activity  Outcome Measure Difficulty turning over in bed (including adjusting bedclothes, sheets and blankets)?: A Lot Difficulty moving from lying on back to sitting on the side of the bed? : A Lot Difficulty sitting down on and standing up from a chair with arms (e.g., wheelchair, bedside commode, etc,.)?: Unable Help needed moving to and from a bed to chair (including a wheelchair)?: A Little Help needed walking in hospital room?: A Little Help needed climbing 3-5 steps with a railing? : A Lot 6 Click Score: 13    End of Session Equipment Utilized During Treatment: Gait belt Activity Tolerance: Patient limited by fatigue;Patient limited by pain Patient left: in chair;with call bell/phone within reach;with family/visitor present   PT Visit Diagnosis: Pain;Difficulty in walking, not elsewhere classified (R26.2) Pain - Right/Left: Left Pain - part of body: Ankle and joints of foot;Hip    Time: 4967-5916 PT Time Calculation (min) (ACUTE ONLY): 18 min   Charges:   PT Evaluation $PT Eval Moderate Complexity: 1 Mod     PT G Codes:          Weston Anna, MPT Pager: [phone]

## 2018-06-05 NOTE — H&P (Addendum)
History and Physical    Jamie Mcdowell HKV:[id number] DOB: 01/08/1961 DOA: 06/04/2018  PCP: Carol Ada, MD  Patient coming from: Home  I have personally briefly reviewed patient's old medical records in [place]  Chief Complaint: Fall down stairs  HPI: Jamie Mcdowell is a 57 y.o. female with medical history significant of HTN.  Fell down stairs yesterday evening.  Presents to ED at [facility] with pain in L groin, knee, left 2nd toe.  Did hit head, no LOC.   ED Course: Imaging shows L superior and inferior pubic rami fx with the inferior fx being comminuted.  L 2nd toe fx.  L knee, CT head, CT neck, CXR are all neg.   Review of Systems: As per HPI otherwise 10 point review of systems negative.   Past Medical History:  Diagnosis Date  . Anemia   . Hypertension     History reviewed. No pertinent surgical history.   reports that she quit smoking about 35 years ago. Her smoking use included cigarettes. She started smoking about 43 years ago. She has a 4.00 pack-year smoking history. She has never used smokeless tobacco. She reports that she does not drink alcohol. Her drug history is not on file.  Allergies  Allergen Reactions  . Penicillins Anaphylaxis  . Ciprocinonide [Fluocinolone] Other (See Comments)    unknown  . Sulfa Antibiotics Rash    No family history on file.   Prior to Admission medications   Medication Sig Start Date End Date Taking? Authorizing Provider  acetaminophen (TYLENOL) 500 MG tablet Take 1,000 mg by mouth every 6 (six) hours as needed for moderate pain.   Yes [provider]  cloNIDine (CATAPRES) 0.2 MG tablet Take 1 tablet (0.2 mg total) by mouth 2 (two) times daily. Patient taking differently: Take 0.1-0.2 mg by mouth See admin instructions. 0.1 mg in the am and 0.2 mg in the evening 06/02/15  Yes Ennever, Rudell Cobb, MD  diphenhydrAMINE (SOMINEX) 25 MG tablet Take 25 mg by mouth every other day.   Yes [provider]  omeprazole  (PRILOSEC) 20 MG capsule Take 20 mg by mouth daily.   Yes [provider]  Vitamin D, Ergocalciferol, (DRISDOL) 50000 units CAPS capsule Take 1 capsule (50,000 Units total) by mouth every 7 (seven) days. Patient taking differently: Take 50,000 Units by mouth every Thursday.  03/14/18  Yes [place], Holli Humbles, NP    Physical Exam: Vitals:   06/04/18 2330 06/04/18 2331 06/05/18 0228 06/05/18 0430  BP: (!) 146/98  (!) 151/94 137/81  Pulse: 92  82 70  Resp: 18  18 18   Temp: 98.4 F (36.9 C)  98.4 F (36.9 C) 98.2 F (36.8 C)  TempSrc: Oral  Oral Oral  SpO2: 100%  100% 100%  Weight:  82.1 kg (181 lb)  81.7 kg (180 lb 1.9 oz)  Height:  5\' 4"  (1.626 m)  5\' 4"  (1.626 m)    Constitutional: NAD, calm, comfortable Eyes: PERRL, lids and conjunctivae normal ENMT: Mucous membranes are moist. Posterior pharynx clear of any exudate or lesions.Normal dentition.  Neck: normal, supple, no masses, no thyromegaly Respiratory: clear to auscultation bilaterally, no wheezing, no crackles. Normal respiratory effort. No accessory muscle use.  Cardiovascular: Regular rate and rhythm, no murmurs / rubs / gallops. No extremity edema. 2+ pedal pulses. No carotid bruits.  Abdomen: no tenderness, no masses palpated. No hepatosplenomegaly. Bowel sounds positive.  Musculoskeletal: L 2nd toe splinted.  L hip TTP. Skin: no rashes,  lesions, ulcers. No induration Neurologic: CN 2-12 grossly intact. Sensation intact, DTR normal. Strength 5/5 in all 4.  Psychiatric: Normal judgment and insight. Alert and oriented x 3. Normal mood.    Labs on Admission: I have personally reviewed following labs and imaging studies  CBC: Recent Labs  Lab 06/05/18 0033  WBC 10.2  NEUTROABS 8.6*  HGB 10.1*  HCT 31.0*  MCV 93.4  PLT 427   Basic Metabolic Panel: Recent Labs  Lab 06/05/18 0033  NA 141  K 3.4*  CL 112*  CO2 22  GLUCOSE 106*  BUN 18  CREATININE 0.80  CALCIUM 8.5*   GFR: Estimated Creatinine  Clearance: 82.2 mL/min (by C-G formula based on SCr of 0.8 mg/dL). Liver Function Tests: No results for input(s): AST, ALT, ALKPHOS, BILITOT, PROT, ALBUMIN in the last 168 hours. No results for input(s): LIPASE, AMYLASE in the last 168 hours. No results for input(s): AMMONIA in the last 168 hours. Coagulation Profile: No results for input(s): INR, PROTIME in the last 168 hours. Cardiac Enzymes: No results for input(s): CKTOTAL, CKMB, CKMBINDEX, TROPONINI in the last 168 hours. BNP (last 3 results) No results for input(s): PROBNP in the last 8760 hours. HbA1C: No results for input(s): HGBA1C in the last 72 hours. CBG: No results for input(s): GLUCAP in the last 168 hours. Lipid Profile: No results for input(s): CHOL, HDL, LDLCALC, TRIG, CHOLHDL, LDLDIRECT in the last 72 hours. Thyroid Function Tests: No results for input(s): TSH, T4TOTAL, FREET4, T3FREE, THYROIDAB in the last 72 hours. Anemia Panel: No results for input(s): VITAMINB12, FOLATE, FERRITIN, TIBC, IRON, RETICCTPCT in the last 72 hours. Urine analysis:    Component Value Date/Time   COLORURINE AMBER (A) 06/20/2016 2105   APPEARANCEUR CLOUDY (A) 06/20/2016 2105   LABSPEC 1.005 06/20/2016 2105   PHURINE 6.0 06/20/2016 2105   GLUCOSEU NEGATIVE 06/20/2016 2105   HGBUR LARGE (A) 06/20/2016 2105   BILIRUBINUR NEGATIVE 06/20/2016 2105   [place] 06/20/2016 2105   PROTEINUR 100 (A) 06/20/2016 2105   UROBILINOGEN 0.2 04/11/2011 1740   NITRITE POSITIVE (A) 06/20/2016 2105   LEUKOCYTESUR LARGE (A) 06/20/2016 2105    Radiological Exams on Admission: Dg Chest 2 View  Result Date: 06/05/2018 CLINICAL DATA:  Fall down steps.  Left groin pain. EXAM: CHEST - 2 VIEW COMPARISON:  Radiograph 06/23/2017 FINDINGS: The cardiomediastinal contours are normal. Moderate to large hiatal hernia. Pulmonary vasculature is normal. No consolidation, pleural effusion, or pneumothorax. No acute osseous abnormalities are seen. IMPRESSION: 1.  No acute chest findings. 2. Hiatal hernia. Electronically Signed   By: [name] M.D.   On: 06/05/2018 00:17   Ct Head Wo Contrast  Result Date: 06/05/2018 CLINICAL DATA:  Fall with headache and nausea EXAM: CT HEAD WITHOUT CONTRAST CT CERVICAL SPINE WITHOUT CONTRAST TECHNIQUE: Multidetector CT imaging of the head and cervical spine was performed following the standard protocol without intravenous contrast. Multiplanar CT image reconstructions of the cervical spine were also generated. COMPARISON:  None. FINDINGS: CT HEAD FINDINGS Brain: There is no mass, hemorrhage or extra-axial collection. The size and configuration of the ventricles and extra-axial CSF spaces are normal. There is no acute or chronic infarction. The brain parenchyma is normal. Vascular: No abnormal hyperdensity of the major intracranial arteries or dural venous sinuses. No intracranial atherosclerosis. Skull: The visualized skull base, calvarium and extracranial soft tissues are normal. Sinuses/Orbits: No fluid levels or advanced mucosal thickening of the visualized paranasal sinuses. No mastoid or middle ear effusion. The orbits are normal.  CT CERVICAL SPINE FINDINGS Alignment: Mild grade 1 anterolisthesis at C3-C4, likely due to facet hypertrophy. Facets are aligned. Occipital condyles are normally positioned. Skull base and vertebrae: No acute fracture. Soft tissues and spinal canal: No prevertebral fluid or swelling. No visible canal hematoma. Disc levels: Severe right C2-3 facet hypertrophy. No spinal canal or neural foraminal stenosis. Otherwise mild multilevel degenerative disc disease. Upper chest: No pneumothorax, pulmonary nodule or pleural effusion. Other: Normal visualized paraspinal cervical soft tissues. IMPRESSION: 1. Normal brain. 2. No acute abnormality of the cervical spine. 3. Mild multilevel degenerative disc disease. Electronically Signed   By: Ulyses Jarred M.D.   On: 06/05/2018 00:54   Ct Cervical Spine Wo  Contrast  Result Date: 06/05/2018 CLINICAL DATA:  Fall with headache and nausea EXAM: CT HEAD WITHOUT CONTRAST CT CERVICAL SPINE WITHOUT CONTRAST TECHNIQUE: Multidetector CT imaging of the head and cervical spine was performed following the standard protocol without intravenous contrast. Multiplanar CT image reconstructions of the cervical spine were also generated. COMPARISON:  None. FINDINGS: CT HEAD FINDINGS Brain: There is no mass, hemorrhage or extra-axial collection. The size and configuration of the ventricles and extra-axial CSF spaces are normal. There is no acute or chronic infarction. The brain parenchyma is normal. Vascular: No abnormal hyperdensity of the major intracranial arteries or dural venous sinuses. No intracranial atherosclerosis. Skull: The visualized skull base, calvarium and extracranial soft tissues are normal. Sinuses/Orbits: No fluid levels or advanced mucosal thickening of the visualized paranasal sinuses. No mastoid or middle ear effusion. The orbits are normal. CT CERVICAL SPINE FINDINGS Alignment: Mild grade 1 anterolisthesis at C3-C4, likely due to facet hypertrophy. Facets are aligned. Occipital condyles are normally positioned. Skull base and vertebrae: No acute fracture. Soft tissues and spinal canal: No prevertebral fluid or swelling. No visible canal hematoma. Disc levels: Severe right C2-3 facet hypertrophy. No spinal canal or neural foraminal stenosis. Otherwise mild multilevel degenerative disc disease. Upper chest: No pneumothorax, pulmonary nodule or pleural effusion. Other: Normal visualized paraspinal cervical soft tissues. IMPRESSION: 1. Normal brain. 2. No acute abnormality of the cervical spine. 3. Mild multilevel degenerative disc disease. Electronically Signed   By: Ulyses Jarred M.D.   On: 06/05/2018 00:54   Dg Knee Complete 4 Views Left  Result Date: 06/05/2018 CLINICAL DATA:  Left knee pain after fall. EXAM: LEFT KNEE - COMPLETE 4+ VIEW COMPARISON:  None.  FINDINGS: No evidence of fracture, dislocation, or joint effusion. Minimal osteoarthritis with peripheral spurring and spurring of the tibial spines. Incidental growth arrest line in the distal femur. Soft tissues are unremarkable. IMPRESSION: Mild degenerative change of the left knee without acute fracture. Electronically Signed   By: [name] M.D.   On: 06/05/2018 03:35   Dg Foot 2 Views Left  Result Date: 06/05/2018 CLINICAL DATA:  Fall with left second toe pain EXAM: LEFT FOOT - 2 VIEW COMPARISON:  None. FINDINGS: There is a minimally displaced, oblique fracture of the midshaft of the left second proximal phalanx. This is best demonstrated on the lateral projection. No other fracture or dislocation. IMPRESSION: Minimally displaced oblique fracture of the proximal phalanx of the left second toe. Electronically Signed   By: Ulyses Jarred M.D.   On: 06/05/2018 00:44   Dg Hip Unilat With Pelvis 2-3 Views Left  Result Date: 06/05/2018 CLINICAL DATA:  Fall down stairs with left groin pain. Initial encounter. EXAM: DG HIP (WITH OR WITHOUT PELVIS) 2-3V LEFT COMPARISON:  None. FINDINGS: Displaced mildly comminuted left inferior pubic ramus fracture.  Minimally displaced superior pubic ramus fracture at the puboacetabular junction. Pubic symphysis remains congruent. No additional acute fracture of the pelvis or left. IMPRESSION: Left superior and inferior pubic rami fractures, inferior ramus fracture is displaced and comminuted. Electronically Signed   By: [name] M.D.   On: 06/05/2018 00:16    EKG: Independently reviewed.  Assessment/Plan Principal Problem:   Closed fracture of multiple pubic rami, left, initial encounter ([facility]) Active Problems:   HTN (hypertension)   Pelvic fracture (HCC)   Closed fracture of phalanx of left second toe    1. Closed fx of L superior and inferior pubic rami - 1. Non-surgical per Dr. [name] 2. Pain control - morphine PRN, scheduled toradol 3. PT/OT  eval and treat 2. HTN - 1. Continue Clonidine 3. L second toe fx - 1. Splinted 2. Also non-surgical.  DVT prophylaxis: Lovenox Code Status: Full Family Communication: No family in room Disposition Plan: TBD Consults called: EDP spoke with Dr [name] Admission status: Place in [place], [place] [facility] Pager [phone] Only works nights!  If 7AM-7PM, please contact the primary day team physician taking care of patient  [email or web address] Password [facility]  06/05/2018, 5:56 AM

## 2018-06-05 NOTE — Plan of Care (Signed)
Mechanical fall, fracture of inferior and superior pubic rami on L.  Also has toe fx.  Non-surgical per Dr. [name].  Getting admitted for pain control.  Will put to med-surg obs.

## 2018-06-05 NOTE — Evaluation (Signed)
Occupational Therapy Evaluation Patient Details Name: Jamie Mcdowell MRN: [id number] DOB: 08/16/1961 Today's Date: 06/05/2018    History of Present Illness 57 yo female admitted with L superior/inferior pubic rami fractures, L toe fx after falling down stairs at home.    Clinical Impression   Pt admitted s/p fall down the stairs with the above fractures.   Pt currently with functional limitations due to the deficits listed below (see OT Problem List).  Pt will benefit from skilled OT to increase their safety and independence with ADL and functional mobility for ADL to facilitate discharge to venue listed below.      Follow Up Recommendations  Home health OT;Supervision/Assistance - 24 hour    Equipment Recommendations  3 in 1 bedside commode    Recommendations for Other Services       Precautions / Restrictions Precautions Precautions: Fall Restrictions Weight Bearing Restrictions: No      Mobility Bed Mobility Overal bed mobility: Needs Assistance Bed Mobility: Supine to Sit     Supine to sit: Min guard;HOB elevated     General bed mobility comments: Increased time. Pt used her UE to assist L LE off bed. Pt c/o some dizziness.   Transfers Overall transfer level: Needs assistance Equipment used: Rolling walker (2 wheeled) Transfers: Sit to/from Stand Sit to Stand: Min assist;From elevated surface         General transfer comment: Assist to rise, stabilize, control descent. VCs safety, technique, hand placement.     Balance Overall balance assessment: History of Falls;Needs assistance         Standing balance support: Bilateral upper extremity supported Standing balance-Leahy Scale: Poor                             ADL either performed or assessed with clinical judgement   ADL Overall ADL's : Needs assistance/impaired Eating/Feeding: Set up;Sitting   Grooming: Sitting;Set up   Upper Body Bathing: Set up;Sitting   Lower Body Bathing:  Maximal assistance;Sit to/from stand;Cueing for sequencing;Cueing for safety   Upper Body Dressing : Set up;Sitting   Lower Body Dressing: Maximal assistance;Sit to/from stand;Cueing for sequencing;Cueing for safety   Toilet Transfer: RW;BSC;+2 for physical assistance;+2 for safety/equipment;Cueing for safety;Cueing for sequencing;Maximal assistance   Toileting- Clothing Manipulation and Hygiene: +2 for physical assistance;+2 for safety/equipment;Total assistance;Sit to/from stand         General ADL Comments: pt in significant pain     Vision Patient Visual Report: No change from baseline       Perception     Praxis      Pertinent Vitals/Pain Pain Assessment: No/denies pain Faces Pain Scale: Hurts whole lot Pain Location: L groing, L toe Pain Descriptors / Indicators: Sore;Sharp;Aching Pain Intervention(s): Limited activity within patient's tolerance;Repositioned     Hand Dominance     Extremity/Trunk Assessment Upper Extremity Assessment Upper Extremity Assessment: Generalized weakness   Lower Extremity Assessment Lower Extremity Assessment: LLE deficits/detail LLE Deficits / Details: Pt able to weightbear but she does c/o sharp groin pain LLE: Unable to fully assess due to pain   Cervical / Trunk Assessment Cervical / Trunk Assessment: Normal   Communication Communication Communication: No difficulties   Cognition Arousal/Alertness: Awake/alert Behavior During Therapy: Anxious Overall Cognitive Status: Within Functional Limits for tasks assessed  Home Living Family/patient expects to be discharged to:: Private residence Living Arrangements: Spouse/significant other Available Help at Discharge: Family Type of Home: House Home Access: Stairs to enter Technical brewer of Steps: 4 Entrance Stairs-Rails: Right Home Layout: Two level;1/2 bath on main level(recliner/couch on 1st  level) Alternate Level Stairs-Number of Steps: 1 flight             Home Equipment: None          Prior Functioning/Environment Level of Independence: Independent                 OT Problem List: Decreased strength;Impaired balance (sitting and/or standing);Decreased activity tolerance;Decreased knowledge of precautions;Decreased safety awareness;Pain;Decreased knowledge of use of DME or AE      OT Treatment/Interventions: Self-care/ADL training;Patient/family education;DME and/or AE instruction    OT Goals(Current goals can be found in the care plan section) Acute Rehab OT Goals Patient Stated Goal: less pain. to heal.  OT Goal Formulation: With patient Time For Goal Achievement: 06/19/18 ADL Goals Pt Will Perform Upper Body Dressing: with set-up;sitting Pt Will Perform Lower Body Dressing: with supervision;sit to/from stand Pt Will Transfer to Toilet: with supervision;bedside commode Pt Will Perform Toileting - Clothing Manipulation and hygiene: with supervision;sit to/from stand  OT Frequency: Min 2X/week   Barriers to D/C:               AM-PAC PT "6 Clicks" Daily Activity     Outcome Measure Help from another person eating meals?: A Little Help from another person taking care of personal grooming?: A Little Help from another person toileting, which includes using toliet, bedpan, or urinal?: Total Help from another person bathing (including washing, rinsing, drying)?: A Lot Help from another person to put on and taking off regular upper body clothing?: A Lot Help from another person to put on and taking off regular lower body clothing?: Total 6 Click Score: 12   End of Session Equipment Utilized During Treatment: Rolling walker Nurse Communication: Mobility status  Activity Tolerance: Patient tolerated treatment well Patient left: in chair  OT Visit Diagnosis: Unsteadiness on feet (R26.81);History of falling (Z91.81);Pain                Time:  8828-0034 OT Time Calculation (min): 18 min Charges:  OT General Charges $OT Visit: 1 Visit OT Evaluation $OT Eval Moderate Complexity: 1 Mod G-Codes:     Kari Baars, [place]  Betsy Pries 06/05/2018, 1:55 PM

## 2018-06-06 DIAGNOSIS — Z882 Allergy status to sulfonamides status: Secondary | ICD-10-CM | POA: Diagnosis not present

## 2018-06-06 DIAGNOSIS — Z87891 Personal history of nicotine dependence: Secondary | ICD-10-CM | POA: Diagnosis not present

## 2018-06-06 DIAGNOSIS — Z881 Allergy status to other antibiotic agents status: Secondary | ICD-10-CM | POA: Diagnosis not present

## 2018-06-06 DIAGNOSIS — S32592A Other specified fracture of left pubis, initial encounter for closed fracture: Secondary | ICD-10-CM | POA: Diagnosis present

## 2018-06-06 DIAGNOSIS — E876 Hypokalemia: Secondary | ICD-10-CM | POA: Diagnosis not present

## 2018-06-06 DIAGNOSIS — M25552 Pain in left hip: Secondary | ICD-10-CM | POA: Diagnosis present

## 2018-06-06 DIAGNOSIS — R338 Other retention of urine: Secondary | ICD-10-CM | POA: Diagnosis not present

## 2018-06-06 DIAGNOSIS — S32512A Fracture of superior rim of left pubis, initial encounter for closed fracture: Secondary | ICD-10-CM | POA: Diagnosis not present

## 2018-06-06 DIAGNOSIS — H8110 Benign paroxysmal vertigo, unspecified ear: Secondary | ICD-10-CM | POA: Diagnosis not present

## 2018-06-06 DIAGNOSIS — W109XXA Fall (on) (from) unspecified stairs and steps, initial encounter: Secondary | ICD-10-CM | POA: Diagnosis present

## 2018-06-06 DIAGNOSIS — S92502D Displaced unspecified fracture of left lesser toe(s), subsequent encounter for fracture with routine healing: Secondary | ICD-10-CM | POA: Diagnosis not present

## 2018-06-06 DIAGNOSIS — D500 Iron deficiency anemia secondary to blood loss (chronic): Secondary | ICD-10-CM | POA: Diagnosis not present

## 2018-06-06 DIAGNOSIS — R42 Dizziness and giddiness: Secondary | ICD-10-CM | POA: Diagnosis not present

## 2018-06-06 DIAGNOSIS — R339 Retention of urine, unspecified: Secondary | ICD-10-CM | POA: Diagnosis not present

## 2018-06-06 DIAGNOSIS — D509 Iron deficiency anemia, unspecified: Secondary | ICD-10-CM | POA: Diagnosis present

## 2018-06-06 DIAGNOSIS — Z6830 Body mass index (BMI) 30.0-30.9, adult: Secondary | ICD-10-CM | POA: Diagnosis not present

## 2018-06-06 DIAGNOSIS — I10 Essential (primary) hypertension: Secondary | ICD-10-CM | POA: Diagnosis not present

## 2018-06-06 DIAGNOSIS — W19XXXA Unspecified fall, initial encounter: Secondary | ICD-10-CM | POA: Diagnosis not present

## 2018-06-06 DIAGNOSIS — E669 Obesity, unspecified: Secondary | ICD-10-CM | POA: Diagnosis present

## 2018-06-06 DIAGNOSIS — S00419A Abrasion of unspecified ear, initial encounter: Secondary | ICD-10-CM | POA: Diagnosis present

## 2018-06-06 DIAGNOSIS — Z880 Allergy status to penicillin: Secondary | ICD-10-CM | POA: Diagnosis not present

## 2018-06-06 DIAGNOSIS — S92512A Displaced fracture of proximal phalanx of left lesser toe(s), initial encounter for closed fracture: Secondary | ICD-10-CM | POA: Diagnosis present

## 2018-06-06 LAB — IRON AND TIBC
IRON: 35 ug/dL (ref 28–170)
Saturation Ratios: 13 % (ref 10.4–31.8)
TIBC: 271 ug/dL (ref 250–450)
UIBC: 236 ug/dL

## 2018-06-06 LAB — FERRITIN: FERRITIN: 85 ng/mL (ref 11–307)

## 2018-06-06 LAB — RETICULOCYTES
RBC.: 3.19 MIL/uL — ABNORMAL LOW (ref 3.87–5.11)
Retic Count, Absolute: 274.3 10*3/uL — ABNORMAL HIGH (ref 19.0–186.0)
Retic Ct Pct: 8.6 % — ABNORMAL HIGH (ref 0.4–3.1)

## 2018-06-06 LAB — CBC WITH DIFFERENTIAL/PLATELET
BASOS ABS: 0 10*3/uL (ref 0.0–0.1)
Basophils Relative: 1 %
EOS PCT: 3 %
Eosinophils Absolute: 0.2 10*3/uL (ref 0.0–0.7)
HCT: 30.3 % — ABNORMAL LOW (ref 36.0–46.0)
Hemoglobin: 9.5 g/dL — ABNORMAL LOW (ref 12.0–15.0)
LYMPHS PCT: 37 %
Lymphs Abs: 1.7 10*3/uL (ref 0.7–4.0)
MCH: 29.8 pg (ref 26.0–34.0)
MCHC: 31.4 g/dL (ref 30.0–36.0)
MCV: 95 fL (ref 78.0–100.0)
MONO ABS: 0.4 10*3/uL (ref 0.1–1.0)
Monocytes Relative: 8 %
Neutro Abs: 2.4 10*3/uL (ref 1.7–7.7)
Neutrophils Relative %: 51 %
PLATELETS: 267 10*3/uL (ref 150–400)
RBC: 3.19 MIL/uL — ABNORMAL LOW (ref 3.87–5.11)
RDW: 15.2 % (ref 11.5–15.5)
WBC: 4.7 10*3/uL (ref 4.0–10.5)

## 2018-06-06 MED ORDER — DIAZEPAM 2 MG PO TABS
2.0000 mg | ORAL_TABLET | Freq: Four times a day (QID) | ORAL | Status: DC | PRN
  Administered 2018-06-06: 2 mg via ORAL
  Filled 2018-06-06 (×3): qty 1

## 2018-06-06 MED ORDER — POTASSIUM CHLORIDE IN NACL 20-0.9 MEQ/L-% IV SOLN
INTRAVENOUS | Status: DC
  Administered 2018-06-06: 11:00:00 via INTRAVENOUS
  Filled 2018-06-06: qty 1000

## 2018-06-06 MED ORDER — FAMOTIDINE IN NACL 20-0.9 MG/50ML-% IV SOLN
20.0000 mg | Freq: Two times a day (BID) | INTRAVENOUS | Status: DC
  Administered 2018-06-06 (×2): 20 mg via INTRAVENOUS
  Filled 2018-06-06 (×2): qty 50

## 2018-06-06 MED ORDER — PROMETHAZINE HCL 25 MG/ML IJ SOLN: 12.5000 mg | INTRAMUSCULAR | Status: DC | PRN

## 2018-06-06 MED ORDER — SODIUM CHLORIDE 0.9 % IV SOLN
510.0000 mg | Freq: Once | INTRAVENOUS | Status: DC
  Filled 2018-06-06: qty 17

## 2018-06-06 MED ORDER — OXYCODONE HCL 5 MG PO TABS
5.0000 mg | ORAL_TABLET | ORAL | Status: DC | PRN
  Administered 2018-06-06 – 2018-06-07 (×3): 5 mg via ORAL
  Filled 2018-06-06 (×3): qty 1

## 2018-06-06 MED ORDER — LORAZEPAM 2 MG/ML IJ SOLN
1.0000 mg | Freq: Once | INTRAMUSCULAR | Status: AC
  Administered 2018-06-06: 1 mg via INTRAVENOUS
  Filled 2018-06-06: qty 1

## 2018-06-06 MED ORDER — ACETAMINOPHEN 500 MG PO TABS
1000.0000 mg | ORAL_TABLET | Freq: Four times a day (QID) | ORAL | Status: DC | PRN
  Administered 2018-06-07 – 2018-06-08 (×4): 1000 mg via ORAL
  Filled 2018-06-06 (×4): qty 2

## 2018-06-06 MED ORDER — SODIUM CHLORIDE 0.9 % IV SOLN
510.0000 mg | Freq: Once | INTRAVENOUS | Status: AC
  Administered 2018-06-06: 510 mg via INTRAVENOUS
  Filled 2018-06-06: qty 17

## 2018-06-06 MED ORDER — POLYETHYLENE GLYCOL 3350 17 G PO PACK
17.0000 g | PACK | Freq: Every day | ORAL | Status: DC
  Administered 2018-06-06 – 2018-06-08 (×3): 17 g via ORAL
  Filled 2018-06-06 (×3): qty 1

## 2018-06-06 NOTE — Progress Notes (Addendum)
Discharge planning, spoke with patient and mother at bedside. They have no preference for [facility] agency, contacted [facility], [facility], and [facility], none of these could accept referral. Contacted [facility] to arrange through them, intake # [id number]. They will contact the patient once [facility] agency secured. Needs hospital bed, RW,  and 3n1, contacted [facility] to deliver to her. [phone]

## 2018-06-06 NOTE — Plan of Care (Signed)

## 2018-06-06 NOTE — Progress Notes (Signed)
    Durable Medical Equipment  (From admission, onward)        Start     Ordered   06/06/18 0954  For home use only DME Bedside commode  Once    Question:  Patient needs a bedside commode to treat with the following condition  Answer:  Closed fracture of multiple pubic rami, left, initial encounter [facility])   06/06/18 0953   06/06/18 0952  For home use only DME Walker  Once    Question:  Patient needs a walker to treat with the following condition  Answer:  Closed fracture of multiple pubic rami, left, initial encounter [facility])   06/06/18 0953   06/06/18 0951  For home use only DME Hospital bed  Once    Question Answer Comment  Patient has (list medical condition): Pelvic fracture. Unable to climb steps to her bedroom.   Bed type Semi-electric      06/06/18 0951

## 2018-06-06 NOTE — Progress Notes (Addendum)
Progress Note    Jamie Mcdowell  LNL:[id number] DOB: Jan 01, 1961  DOA: 06/04/2018 PCP: Carol Ada, MD    Brief Narrative:   Chief complaint: F/U pelvic/toe fx  Medical records reviewed and are as summarized below:  Jamie Mcdowell is an 57 y.o. female with a PMH of hypertension who fell down the stairs resulting in left superior/inferior pubic rami fractures and a left second toe fracture.  Assessment/Plan:   Principal Problem:   Closed fracture of multiple pubic rami, left,/pelvic fracture closed fracture of phalanx of left second toe Plain films personally reviewed. None of her fractures are surgical.  Dr. [name] consulted.  Left toe splinted.  Pain control with morphine and Toradol.  Unfortunately, has been too nauseated and has had persistent vomiting, so there may be a delay in moving her to oral medications for pain control. Start IVF. Stop Toradol and start IV pepcid. PT/OT performed with recommendations for home health services and 24-hour supervision. CM consult for home needs. Will need BSC, RW and a hospital bed since she is unable to climb stairs and her bedroom is on the second floor of her home.  Active Problems:   Iron deficiency anemia Receives monthly iron infusions at Dr. [name] office and is due.  Iron and hgb low.  Will give Feraheme today.    Nausea, dizziness, headache Had some vomiting this morning and was unable to eat breakfast. Took Zofran. Start IVF.  Add Phenergan for breakthrough vomiting. Stop Toradol. Start IV Pepcid.    Urinary retention Resolved.    Hypokalemia Provided with supplementation.    HTN (hypertension) Continue clonidine.  Blood pressure currently controlled.    Obesity, BMI 30.0-30.9, adult Body mass index is 30.92 kg/m.   Family Communication/Anticipated D/C date and plan/Code Status   DVT prophylaxis: Lovenox ordered. Code Status: Full Code.  Family Communication: Mother at the bedside.   Disposition Plan: Home  tomorrow if able to tolerate orals and pain can be controlled with oral pain medications.   Medical Consultants:    Dr. [name], Orthopedics.   Anti-Infectives:    None  Subjective:   Remains tearful and anxious. Reports nausea with an episode of vomiting. Unable to eat breakfast. Able to ambulate 5 feet with PT yesterday and sat up in the chair for an hour. Still requiring IV pain medications due to inability to tolerate oral.    Objective:    Vitals:   06/05/18 2047 06/05/18 2237 06/06/18 0536 06/06/18 0537  BP: (!) 156/89  (!) 145/75 (!) 145/75  Pulse: 66  70 67  Resp: 18  18 14   Temp: 98.1 F (36.7 C) 98.5 F (36.9 C) 98.6 F (37 C) 98.6 F (37 C)  TempSrc: Oral Oral Oral Oral  SpO2: 100%  98% 98%  Weight:      Height:        Intake/Output Summary (Last 24 hours) at 06/06/2018 0724 Last data filed at 06/06/2018 0550 Gross per 24 hour  Intake 540 ml  Output 1525 ml  Net -985 ml   Filed Weights   06/04/18 2331 06/05/18 0430  Weight: 82.1 kg (181 lb) 81.7 kg (180 lb 1.9 oz)    Exam: General: Anxious and tearful. Cardiovascular: Heart sounds show a regular rate, and rhythm. No gallops or rubs. No murmurs. No JVD. Lungs: Clear to auscultation bilaterally with good air movement. No rales, rhonchi or wheezes. Abdomen: Soft, nontender, nondistended with normal active bowel sounds. No masses. No hepatosplenomegaly. Skin: Warm  and dry. No rashes or lesions. Extremities: No clubbing or cyanosis. No edema. Pedal pulses 2+.  Data Reviewed:   I have personally reviewed following labs and imaging studies:  Labs: Labs show the following:   Basic Metabolic Panel: Recent Labs  Lab 06/05/18 0033  NA 141  K 3.4*  CL 112*  CO2 22  GLUCOSE 106*  BUN 18  CREATININE 0.80  CALCIUM 8.5*   GFR Estimated Creatinine Clearance: 82.2 mL/min (by C-G formula based on SCr of 0.8 mg/dL).  CBC: Recent Labs  Lab 06/05/18 0033 06/06/18 0507  WBC 10.2 4.7  NEUTROABS  8.6* 2.4  HGB 10.1* 9.5*  HCT 31.0* 30.3*  MCV 93.4 95.0  PLT 313 267    Microbiology No results found for this or any previous visit (from the past 240 hour(s)).  Procedures and diagnostic studies:  Dg Chest 2 View  Result Date: 06/05/2018 CLINICAL DATA:  Fall down steps.  Left groin pain. EXAM: CHEST - 2 VIEW COMPARISON:  Radiograph 06/23/2017 FINDINGS: The cardiomediastinal contours are normal. Moderate to large hiatal hernia. Pulmonary vasculature is normal. No consolidation, pleural effusion, or pneumothorax. No acute osseous abnormalities are seen. IMPRESSION: 1. No acute chest findings. 2. Hiatal hernia. Electronically Signed   By: [name] M.D.   On: 06/05/2018 00:17   Ct Head Wo Contrast  Result Date: 06/05/2018 CLINICAL DATA:  Fall with headache and nausea EXAM: CT HEAD WITHOUT CONTRAST CT CERVICAL SPINE WITHOUT CONTRAST TECHNIQUE: Multidetector CT imaging of the head and cervical spine was performed following the standard protocol without intravenous contrast. Multiplanar CT image reconstructions of the cervical spine were also generated. COMPARISON:  None. FINDINGS: CT HEAD FINDINGS Brain: There is no mass, hemorrhage or extra-axial collection. The size and configuration of the ventricles and extra-axial CSF spaces are normal. There is no acute or chronic infarction. The brain parenchyma is normal. Vascular: No abnormal hyperdensity of the major intracranial arteries or dural venous sinuses. No intracranial atherosclerosis. Skull: The visualized skull base, calvarium and extracranial soft tissues are normal. Sinuses/Orbits: No fluid levels or advanced mucosal thickening of the visualized paranasal sinuses. No mastoid or middle ear effusion. The orbits are normal. CT CERVICAL SPINE FINDINGS Alignment: Mild grade 1 anterolisthesis at C3-C4, likely due to facet hypertrophy. Facets are aligned. Occipital condyles are normally positioned. Skull base and vertebrae: No acute fracture.  Soft tissues and spinal canal: No prevertebral fluid or swelling. No visible canal hematoma. Disc levels: Severe right C2-3 facet hypertrophy. No spinal canal or neural foraminal stenosis. Otherwise mild multilevel degenerative disc disease. Upper chest: No pneumothorax, pulmonary nodule or pleural effusion. Other: Normal visualized paraspinal cervical soft tissues. IMPRESSION: 1. Normal brain. 2. No acute abnormality of the cervical spine. 3. Mild multilevel degenerative disc disease. Electronically Signed   By: Ulyses Jarred M.D.   On: 06/05/2018 00:54   Ct Cervical Spine Wo Contrast  Result Date: 06/05/2018 CLINICAL DATA:  Fall with headache and nausea EXAM: CT HEAD WITHOUT CONTRAST CT CERVICAL SPINE WITHOUT CONTRAST TECHNIQUE: Multidetector CT imaging of the head and cervical spine was performed following the standard protocol without intravenous contrast. Multiplanar CT image reconstructions of the cervical spine were also generated. COMPARISON:  None. FINDINGS: CT HEAD FINDINGS Brain: There is no mass, hemorrhage or extra-axial collection. The size and configuration of the ventricles and extra-axial CSF spaces are normal. There is no acute or chronic infarction. The brain parenchyma is normal. Vascular: No abnormal hyperdensity of the major intracranial arteries or dural venous  sinuses. No intracranial atherosclerosis. Skull: The visualized skull base, calvarium and extracranial soft tissues are normal. Sinuses/Orbits: No fluid levels or advanced mucosal thickening of the visualized paranasal sinuses. No mastoid or middle ear effusion. The orbits are normal. CT CERVICAL SPINE FINDINGS Alignment: Mild grade 1 anterolisthesis at C3-C4, likely due to facet hypertrophy. Facets are aligned. Occipital condyles are normally positioned. Skull base and vertebrae: No acute fracture. Soft tissues and spinal canal: No prevertebral fluid or swelling. No visible canal hematoma. Disc levels: Severe right C2-3 facet  hypertrophy. No spinal canal or neural foraminal stenosis. Otherwise mild multilevel degenerative disc disease. Upper chest: No pneumothorax, pulmonary nodule or pleural effusion. Other: Normal visualized paraspinal cervical soft tissues. IMPRESSION: 1. Normal brain. 2. No acute abnormality of the cervical spine. 3. Mild multilevel degenerative disc disease. Electronically Signed   By: Ulyses Jarred M.D.   On: 06/05/2018 00:54   Dg Knee Complete 4 Views Left  Result Date: 06/05/2018 CLINICAL DATA:  Left knee pain after fall. EXAM: LEFT KNEE - COMPLETE 4+ VIEW COMPARISON:  None. FINDINGS: No evidence of fracture, dislocation, or joint effusion. Minimal osteoarthritis with peripheral spurring and spurring of the tibial spines. Incidental growth arrest line in the distal femur. Soft tissues are unremarkable. IMPRESSION: Mild degenerative change of the left knee without acute fracture. Electronically Signed   By: [name] M.D.   On: 06/05/2018 03:35   Dg Foot 2 Views Left  Result Date: 06/05/2018 CLINICAL DATA:  Fall with left second toe pain EXAM: LEFT FOOT - 2 VIEW COMPARISON:  None. FINDINGS: There is a minimally displaced, oblique fracture of the midshaft of the left second proximal phalanx. This is best demonstrated on the lateral projection. No other fracture or dislocation. IMPRESSION: Minimally displaced oblique fracture of the proximal phalanx of the left second toe. Electronically Signed   By: Ulyses Jarred M.D.   On: 06/05/2018 00:44   Dg Hip Unilat With Pelvis 2-3 Views Left  Result Date: 06/05/2018 CLINICAL DATA:  Fall down stairs with left groin pain. Initial encounter. EXAM: DG HIP (WITH OR WITHOUT PELVIS) 2-3V LEFT COMPARISON:  None. FINDINGS: Displaced mildly comminuted left inferior pubic ramus fracture. Minimally displaced superior pubic ramus fracture at the puboacetabular junction. Pubic symphysis remains congruent. No additional acute fracture of the pelvis or left. IMPRESSION:  Left superior and inferior pubic rami fractures, inferior ramus fracture is displaced and comminuted. Electronically Signed   By: [name] M.D.   On: 06/05/2018 00:16    Medications:   . cloNIDine  0.1 mg Oral Daily  . cloNIDine  0.2 mg Oral QHS  . enoxaparin (LOVENOX) injection  40 mg Subcutaneous Daily  . ketorolac  30 mg Intravenous Q6H  . pantoprazole  40 mg Oral Daily  . Vitamin D (Ergocalciferol)  50,000 Units Oral Q Thu   Continuous Infusions:   LOS: 0 days   Jacquelynn Cree  [facility] Pager [phone]. If unable to reach me by pager, please call my cell phone at [phone].  *Please refer to [email or web address], password [email or web address] to get updated schedule on who will round on this patient, as hospitalists switch teams weekly. If 7PM-7AM, please contact night-coverage at [email or web address], password [email or web address] for any overnight needs.  06/06/2018, 7:24 AM

## 2018-06-06 NOTE — Progress Notes (Signed)
Physical Therapy Treatment Patient Details Name: Jamie Mcdowell MRN: [id number] DOB: 11-Nov-1961 Today's Date: 06/06/2018    History of Present Illness 57 yo female admitted with L superior/inferior pubic rami fractures, L toe fx after falling down stairs at home.     PT Comments    Progressing slowly with mobility. Overall, pt was Min guard assist for mobility. She pivoted to bsc then walked ~15 feet in the room with a RW. Pt reports increased pain on today. Plan is for possible d/c home on tomorrow. Will try to practice stair negotiation prior to d/c.     Follow Up Recommendations  Home health PT;Supervision/Assistance - 24 hour     Equipment Recommendations  Rolling walker with 5" wheels;Hospital bed;3in1 (PT)    Recommendations for Other Services       Precautions / Restrictions Precautions Precautions: Fall Required Braces or Orthoses: Other Brace/Splint Other Brace/Splint: L post op shoe Restrictions Weight Bearing Restrictions: No    Mobility  Bed Mobility Overal bed mobility: Needs Assistance Bed Mobility: Supine to Sit     Supine to sit: Min guard;HOB elevated     General bed mobility comments: Increased time. Pt used her UE to assist L LE off bed. Pt c/o some dizziness.   Transfers Overall transfer level: Needs assistance Equipment used: Rolling walker (2 wheeled) Transfers: Sit to/from Stand Sit to Stand: Min guard assist;From elevated surface         General transfer comment: Assist to rise, stabilize, control descent. VCs safety, technique, hand placement. Stand pivot, bed to bsc, with RW. Increased time. Close guard for safety. Pt c/o some dizziness.   Ambulation/Gait Ambulation/Gait assistance: Min guard Gait Distance (Feet): 15 Feet Assistive device: Rolling walker (2 wheeled) Gait Pattern/deviations: Step-to pattern;Antalgic     General Gait Details: VCs safety, technique, sequence. Very slow gait speed. Distance limited by pain, fatigue,  drowsiness from ativan.    Stairs             Wheelchair Mobility    Modified Rankin (Stroke Patients Only)       Balance                                            Cognition Arousal/Alertness: Awake/alert Behavior During Therapy: WFL for tasks assessed/performed Overall Cognitive Status: Within Functional Limits for tasks assessed                                        Exercises      General Comments        Pertinent Vitals/Pain Pain Assessment: Faces Faces Pain Scale: Hurts whole lot Pain Location: L groin, L toe Pain Descriptors / Indicators: Sore;Sharp;Aching Pain Intervention(s): Limited activity within patient's tolerance;Monitored during session;Repositioned    Home Living                      Prior Function            PT Goals (current goals can now be found in the care plan section) Progress towards PT goals: Progressing toward goals    Frequency    Min 3X/week      PT Plan Current plan remains appropriate    Co-evaluation  AM-PAC PT "6 Clicks" Daily Activity  Outcome Measure  Difficulty turning over in bed (including adjusting bedclothes, sheets and blankets)?: A Lot Difficulty moving from lying on back to sitting on the side of the bed? : A Lot Difficulty sitting down on and standing up from a chair with arms (e.g., wheelchair, bedside commode, etc,.)?: A Lot Help needed moving to and from a bed to chair (including a wheelchair)?: A Little Help needed walking in hospital room?: A Little Help needed climbing 3-5 steps with a railing? : A Lot 6 Click Score: 14    End of Session Equipment Utilized During Treatment: Gait belt Activity Tolerance: Patient limited by fatigue;Patient limited by pain Patient left: in chair;with call bell/phone within reach;with family/visitor present   PT Visit Diagnosis: Pain;Difficulty in walking, not elsewhere classified (R26.2) Pain -  Right/Left: Left Pain - part of body: Ankle and joints of foot;Hip     Time: 9030-0923 PT Time Calculation (min) (ACUTE ONLY): 14 min  Charges:  $Gait Training: 8-22 mins                    G Codes:         Weston Anna, MPT Pager: [phone]

## 2018-06-07 DIAGNOSIS — W19XXXA Unspecified fall, initial encounter: Secondary | ICD-10-CM

## 2018-06-07 DIAGNOSIS — Z6830 Body mass index (BMI) 30.0-30.9, adult: Secondary | ICD-10-CM

## 2018-06-07 DIAGNOSIS — R42 Dizziness and giddiness: Secondary | ICD-10-CM

## 2018-06-07 DIAGNOSIS — D500 Iron deficiency anemia secondary to blood loss (chronic): Secondary | ICD-10-CM

## 2018-06-07 DIAGNOSIS — E876 Hypokalemia: Secondary | ICD-10-CM

## 2018-06-07 DIAGNOSIS — S32512A Fracture of superior rim of left pubis, initial encounter for closed fracture: Secondary | ICD-10-CM

## 2018-06-07 DIAGNOSIS — I10 Essential (primary) hypertension: Secondary | ICD-10-CM

## 2018-06-07 DIAGNOSIS — S32592A Other specified fracture of left pubis, initial encounter for closed fracture: Principal | ICD-10-CM

## 2018-06-07 DIAGNOSIS — S92502D Displaced unspecified fracture of left lesser toe(s), subsequent encounter for fracture with routine healing: Secondary | ICD-10-CM

## 2018-06-07 DIAGNOSIS — R338 Other retention of urine: Secondary | ICD-10-CM

## 2018-06-07 LAB — URINALYSIS, ROUTINE W REFLEX MICROSCOPIC
Bacteria, UA: NONE SEEN
Bilirubin Urine: NEGATIVE
Glucose, UA: NEGATIVE mg/dL
Hgb urine dipstick: NEGATIVE
Ketones, ur: NEGATIVE mg/dL
Nitrite: NEGATIVE
Protein, ur: NEGATIVE mg/dL
Specific Gravity, Urine: 1.005 (ref 1.005–1.030)
pH: 5 (ref 5.0–8.0)

## 2018-06-07 MED ORDER — FAMOTIDINE 20 MG PO TABS
20.0000 mg | ORAL_TABLET | Freq: Two times a day (BID) | ORAL | Status: DC
Start: 2018-06-07 — End: 2018-06-08
  Administered 2018-06-07 – 2018-06-08 (×3): 20 mg via ORAL
  Filled 2018-06-07 (×3): qty 1

## 2018-06-07 MED ORDER — BISACODYL 10 MG RE SUPP
10.0000 mg | Freq: Every day | RECTAL | Status: DC | PRN
  Administered 2018-06-07: 10 mg via RECTAL
  Filled 2018-06-07: qty 1

## 2018-06-07 NOTE — Progress Notes (Signed)
Physical Therapy Treatment Patient Details Name: Jamie Mcdowell MRN: [id number] DOB: Feb 06, 1961 Today's Date: 06/07/2018    History of Present Illness 57 yo female admitted with L superior/inferior pubic rami fractures, L toe fx after falling down stairs at home.     PT Comments    Pt  Continues to be anxious regarding mobility, Vestibular order noted, checked positional BPs and pt with elevated BP today despite meds--pt symptoms appear to be related to BP as she describes "lightheadedness" and feeling a "rush" when she sits up; completed stand pivot x2 and no further dizziness noted; will continue to monitor for symptoms and assess further if  Indicated; unable to complete stairs since pt lunch arrived, will return later this pm if schedule permits  Supine BP 761/60 Sitting BP 145/95 Standing BP 142/106  HR in 70s all positions  Follow Up Recommendations  Home health PT;Supervision/Assistance - 24 hour     Equipment Recommendations  Rolling walker with 5" wheels;Hospital bed;3in1 (PT)    Recommendations for Other Services       Precautions / Restrictions Precautions Precautions: Fall Other Brace/Splint: L post op shoe Restrictions Weight Bearing Restrictions: No    Mobility  Bed Mobility Overal bed mobility: Needs Assistance Bed Mobility: Supine to Sit     Supine to sit: Min guard;HOB elevated     General bed mobility comments: incr time, min/guard for safety; pt comlained of mild dizziness or "lightheadedness" and feeling a "rush" when sitting  Transfers Overall transfer level: Needs assistance Equipment used: Rolling walker (2 wheeled) Transfers: Sit to/from [facility] Sit to Stand: Min guard;Min assist Stand pivot transfers: Min guard       General transfer comment: Assist to control descent, stabilize, control descent. VCs safety, technique, hand placement. Stand pivot, bed to bsc, with RW. Increased time.   Ambulation/Gait Ambulation/Gait  assistance: Min guard Gait Distance (Feet): 6 Feet Assistive device: Rolling walker (2 wheeled) Gait Pattern/deviations: Step-to pattern;Antalgic     General Gait Details: veral cues for safety, technique, decr speed, amb to  chair only because pt lunch arrived--no dizziness ater initial transition   Stairs             Wheelchair Mobility    Modified Rankin (Stroke Patients Only)       Balance Overall balance assessment: History of Falls;Needs assistance Sitting-balance support: No upper extremity supported;Feet supported Sitting balance-Leahy Scale: Good     Standing balance support: Single extremity supported;Bilateral upper extremity supported;During functional activity Standing balance-Leahy Scale: Poor                              Cognition Arousal/Alertness: Awake/alert Behavior During Therapy: WFL for tasks assessed/performed Overall Cognitive Status: Within Functional Limits for tasks assessed                                 General Comments: pt tearful due to dizzy spell she had earlier this morning after returning to bathroom and back to bed per RN and pt report      Exercises      General Comments        Pertinent Vitals/Pain Pain Assessment: 0-10 Pain Score: 4  Pain Location: L groin, L toe Pain Descriptors / Indicators: Sore;Aching Pain Intervention(s): Monitored during session;Premedicated before session    Home Living  Prior Function            PT Goals (current goals can now be found in the care plan section) Acute Rehab PT Goals Patient Stated Goal: less pain. to heal.  PT Goal Formulation: With patient/family Time For Goal Achievement: 06/19/18 Potential to Achieve Goals: Good Progress towards PT goals: Progressing toward goals    Frequency    Min 3X/week      PT Plan Current plan remains appropriate    Co-evaluation              AM-PAC PT "6 Clicks" Daily  Activity  Outcome Measure  Difficulty turning over in bed (including adjusting bedclothes, sheets and blankets)?: A Lot Difficulty moving from lying on back to sitting on the side of the bed? : A Lot Difficulty sitting down on and standing up from a chair with arms (e.g., wheelchair, bedside commode, etc,.)?: A Lot Help needed moving to and from a bed to chair (including a wheelchair)?: A Little Help needed walking in hospital room?: A Little Help needed climbing 3-5 steps with a railing? : A Lot 6 Click Score: 14    End of Session Equipment Utilized During Treatment: Gait belt Activity Tolerance: Patient tolerated treatment well;Patient limited by fatigue Patient left: in chair;with call bell/phone within reach;with chair alarm set   PT Visit Diagnosis: Pain;Difficulty in walking, not elsewhere classified (R26.2) Pain - Right/Left: Left Pain - part of body: Ankle and joints of foot;Hip     Time: 1130-1155 PT Time Calculation (min) (ACUTE ONLY): 25 min  Charges:  $Therapeutic Activity: 23-37 mins                    G CodesKenyon Ana, PT Pager: [phone] 06/07/2018    [facility] 06/07/2018, 12:57 PM

## 2018-06-07 NOTE — Progress Notes (Signed)
Occupational Therapy Treatment Patient Details Name: Jamie Mcdowell MRN: [id number] DOB: 09/18/1961 Today's Date: 06/07/2018    History of present illness 57 yo female admitted with L superior/inferior pubic rami fractures, L toe fx after falling down stairs at home.    OT comments  Pt in bed and tearful upon arrival. Pt c/o some dizziness earlier this morning after return from using [facility]. Pt's RN informed OT PTA. Pt participated in transfers to [facility] with OT, toileting and UB dressing. Pt and spouse educated extensively on home safety for ADLs and ADL mobility and using DME. Pt/spous concerned about getting house on steps; PT will see pt today and address this. OT will continue to follow acutely  Follow Up Recommendations  Home health OT;Supervision/Assistance - 24 hour    Equipment Recommendations  3 in 1 bedside commode;Other (comment)(reacher, LH bath sponge, sock aid)    Recommendations for Other Services      Precautions / Restrictions Precautions Precautions: Fall Other Brace/Splint: L post op shoe Restrictions Weight Bearing Restrictions: No       Mobility Bed Mobility Overal bed mobility: Needs Assistance Bed Mobility: Supine to Sit     Supine to sit: Min guard;HOB elevated     General bed mobility comments: Increased time. Pt used her UE to assist L LE off bed. Pt c/o some dizziness.   Transfers Overall transfer level: Needs assistance Equipment used: Rolling walker (2 wheeled) Transfers: Sit to/from Stand Sit to Stand: Min guard         General transfer comment: Assist to rise, stabilize, control descent. VCs safety, technique, hand placement. Stand pivot, bed to bsc, with RW. Increased time.     Balance Overall balance assessment: History of Falls;Needs assistance Sitting-balance support: No upper extremity supported;Feet supported Sitting balance-Leahy Scale: Good     Standing balance support: Bilateral upper extremity supported;During functional  activity Standing balance-Leahy Scale: Poor                             ADL either performed or assessed with clinical judgement   ADL Overall ADL's : Needs assistance/impaired                                             Vision Patient Visual Report: No change from baseline     Perception     Praxis      Cognition Arousal/Alertness: Awake/alert Behavior During Therapy: WFL for tasks assessed/performed Overall Cognitive Status: Within Functional Limits for tasks assessed                                 General Comments: pt tearful due to dizzy spell she had earlier this morning after returning to bathroom and back to bed per RN and pt report        Exercises     Shoulder Instructions       General Comments      Pertinent Vitals/ Pain       Pain Assessment: 0-10 Pain Score: 3  Pain Location: L groin, L toe. pt pre medicated Pain Descriptors / Indicators: Sore;Aching Pain Intervention(s): Limited activity within patient's tolerance;Monitored during session;Repositioned;Premedicated before session  Home Living  Prior Functioning/Environment              Frequency  Min 2X/week        Progress Toward Goals  OT Goals(current goals can now be found in the care plan section)  Progress towards OT goals: Progressing toward goals     Plan Discharge plan remains appropriate    Co-evaluation                 AM-PAC PT "6 Clicks" Daily Activity     Outcome Measure   Help from another person eating meals?: None Help from another person taking care of personal grooming?: A Little Help from another person toileting, which includes using toliet, bedpan, or urinal?: A Lot Help from another person bathing (including washing, rinsing, drying)?: A Lot Help from another person to put on and taking off regular upper body clothing?: None Help from another  person to put on and taking off regular lower body clothing?: Total 6 Click Score: 16    End of Session Equipment Utilized During Treatment: Rolling walker;Other (comment)(BSC)  OT Visit Diagnosis: Unsteadiness on feet (R26.81);History of falling (Z91.81);Pain Pain - Right/Left: Left Pain - part of body: (pelvis)   Activity Tolerance Patient tolerated treatment well(c/o some dizziness)   Patient Left in chair   Nurse Communication      Functional Assessment Tool Used: AM-PAC 6 Clicks Daily Activity   Time: 1001-1040 OT Time Calculation (min): 39 min  Charges: OT G-codes **NOT FOR INPATIENT CLASS** Functional Assessment Tool Used: AM-PAC 6 Clicks Daily Activity OT General Charges $OT Visit: 1 Visit OT Treatments $Self Care/Home Management : 8-22 mins $Therapeutic Activity: 23-37 mins    Britt Bottom 06/07/2018, 11:15 AM

## 2018-06-07 NOTE — Progress Notes (Signed)
The patient is receiving Pepcid by the intravenous route.  Based on criteria approved by the Pharmacy and [place], the medication is being converted to the equivalent oral dose form.  These criteria include: -No active GI bleeding -Able to tolerate diet of full liquids (or better) or tube feeding -Able to tolerate other medications by the oral or enteral route  If you have any questions about this conversion, please contact the Pharmacy Department (phone [phone]).  Thank you.  Royetta Asal, PharmD, BCPS Pager [phone] 06/07/2018 11:25 AM

## 2018-06-07 NOTE — Progress Notes (Signed)
PROGRESS NOTE    Jamie Mcdowell   AOZ:[id number]  DOB: 05/20/1961  DOA: 06/04/2018 PCP: Carol Ada, MD   Brief Narrative:  Jamie Mcdowell  is an 57 y.o. female with a PMH of hypertension who fell down the stairs resulting in left superior/inferior pubic rami fractures and a left second toe fracture.  Subjective: Felt like she was spinning today when she leaned her head back in the recliner and then tried to lean if forwards. The sensation was temporary but very severe.     Assessment & Plan:   Principal Problem:   Closed fracture of multiple pubic rami, left, initial encounter    Closed fracture of phalanx of left second toe - conservative management recommended by ortho - will go home with HHPT- need stair training today  Active Problems:   Iron deficiency anemia - receives months Iron infusions by dr Marin Olp- given an infusion yesterday  Dizziness - today's symptoms sound to be BPPV- have consulted PT for a vestibular eval today  Vomiting - no complaints of this today- she was started on pepcid in addition to PPI    HTN (hypertension)  - [place]  Obesity Body mass index is 30.92 kg/m.     Hypokalemia - replacement ordered yesterday    Acute urinary retention - due to fractures- has resolved  DVT prophylaxis: Lovenox Code Status: Full code Family Communication:  Disposition Plan: home when BPPV assessed for, stair practice done and hospital bed made available Consultants:   ortho Procedures:   none Antimicrobials:  Anti-infectives (From admission, onward)   None       Objective: Vitals:   06/06/18 1353 06/06/18 1359 06/06/18 2039 06/07/18 0609  BP: (!) 147/87 (!) 147/87 (!) 157/95 (!) 143/77  Pulse:  79 78 70  Resp:  18 17 16   Temp:  97.8 F (36.6 C) 99 F (37.2 C) 98.3 F (36.8 C)  TempSrc:  Oral Oral Oral  SpO2:  100% 100% 95%  Weight:      Height:        Intake/Output Summary (Last 24 hours) at 06/07/2018 1224 Last data filed at  06/07/2018 1024 Gross per 24 hour  Intake 840 ml  Output 1450 ml  Net -610 ml   Filed Weights   06/04/18 2331 06/05/18 0430  Weight: 82.1 kg (181 lb) 81.7 kg (180 lb 1.9 oz)    Examination: General exam: Appears comfortable  HEENT: PERRLA, oral mucosa moist, no sclera icterus or thrush Respiratory system: Clear to auscultation. Respiratory effort normal. Cardiovascular system: S1 & S2 heard, RRR.   Gastrointestinal system: Abdomen soft, non-tender, nondistended. Normal bowel sound. No organomegaly Central nervous system: Alert and oriented. No focal neurological deficits. Extremities: No cyanosis, clubbing or edema Skin: No rashes or ulcers Psychiatry:  Mood & affect appropriate.     Data Reviewed: I have personally reviewed following labs and imaging studies  CBC: Recent Labs  Lab 06/05/18 0033 06/06/18 0507  WBC 10.2 4.7  NEUTROABS 8.6* 2.4  HGB 10.1* 9.5*  HCT 31.0* 30.3*  MCV 93.4 95.0  PLT 313 962   Basic Metabolic Panel: Recent Labs  Lab 06/05/18 0033  NA 141  K 3.4*  CL 112*  CO2 22  GLUCOSE 106*  BUN 18  CREATININE 0.80  CALCIUM 8.5*   GFR: Estimated Creatinine Clearance: 82.2 mL/min (by C-G formula based on SCr of 0.8 mg/dL). Liver Function Tests: No results for input(s): AST, ALT, ALKPHOS, BILITOT, PROT, ALBUMIN in the last 168 hours.  No results for input(s): LIPASE, AMYLASE in the last 168 hours. No results for input(s): AMMONIA in the last 168 hours. Coagulation Profile: No results for input(s): INR, PROTIME in the last 168 hours. Cardiac Enzymes: No results for input(s): CKTOTAL, CKMB, CKMBINDEX, TROPONINI in the last 168 hours. BNP (last 3 results) No results for input(s): PROBNP in the last 8760 hours. HbA1C: No results for input(s): HGBA1C in the last 72 hours. CBG: No results for input(s): GLUCAP in the last 168 hours. Lipid Profile: No results for input(s): CHOL, HDL, LDLCALC, TRIG, CHOLHDL, LDLDIRECT in the last 72 hours. Thyroid  Function Tests: No results for input(s): TSH, T4TOTAL, FREET4, T3FREE, THYROIDAB in the last 72 hours. Anemia Panel: Recent Labs    06/06/18 0507  FERRITIN 85  TIBC 271  IRON 35  RETICCTPCT 8.6*   Urine analysis:    Component Value Date/Time   COLORURINE AMBER (A) 06/20/2016 2105   APPEARANCEUR CLOUDY (A) 06/20/2016 2105   LABSPEC 1.005 06/20/2016 2105   PHURINE 6.0 06/20/2016 2105   GLUCOSEU NEGATIVE 06/20/2016 2105   HGBUR LARGE (A) 06/20/2016 2105   BILIRUBINUR NEGATIVE 06/20/2016 2105   KETONESUR NEGATIVE 06/20/2016 2105   PROTEINUR 100 (A) 06/20/2016 2105   UROBILINOGEN 0.2 04/11/2011 1740   NITRITE POSITIVE (A) 06/20/2016 2105   LEUKOCYTESUR LARGE (A) 06/20/2016 2105   Sepsis Labs: @LABRCNTIP (procalcitonin:4,lacticidven:4) )No results found for this or any previous visit (from the past 240 hour(s)).       Radiology Studies: No results found.    Scheduled Meds: . cloNIDine  0.1 mg Oral Daily  . cloNIDine  0.2 mg Oral QHS  . enoxaparin (LOVENOX) injection  40 mg Subcutaneous Daily  . famotidine  20 mg Oral BID  . pantoprazole  40 mg Oral Daily  . polyethylene glycol  17 g Oral Daily  . Vitamin D (Ergocalciferol)  50,000 Units Oral Q Thu   Continuous Infusions: . ferumoxytol       LOS: 1 day    Time spent in minutes: 35    Debbe Odea, MD [facility] Pager: [email or web address] Password [facility] 06/07/2018, 12:24 PM

## 2018-06-07 NOTE — Progress Notes (Signed)
06/07/18 1600  PT Visit Information  Last PT Received On 06/07/18  Pt progressing, fatigues easily; wants to shower prior to d/c tomorrow--discussed transfer technique (pt is not on OT schedule for Sunday); pt did well with stairs but would like to practice again, possibly utilize a different technique for better pain control; pt continues to report dizziness, no nystagmus noted  (saccades and  smooth pursuits appear WNL); will further assess next session if pt symptoms continue;  Assistance Needed +1  History of Present Illness 57 yo female admitted with L superior/inferior pubic rami fractures, L toe fx after falling down stairs at home.   Subjective Data  Patient Stated Goal less pain. to heal.   Precautions  Precautions Fall  Other Brace/Splint L post op shoe  Restrictions  Weight Bearing Restrictions No  Pain Assessment  Pain Assessment 0-10  Pain Score 4  Pain Location L groin, L toe  Pain Descriptors / Indicators Sore;Aching  Pain Intervention(s) Monitored during session  Cognition  Arousal/Alertness Awake/alert  Behavior During Therapy WFL for tasks assessed/performed  Overall Cognitive Status Within Functional Limits for tasks assessed  Bed Mobility  Overal bed mobility Needs Assistance  Bed Mobility Supine to Sit;Sit to Supine  Supine to sit Min guard  Sit to supine Min assist  General bed mobility comments assist with LLE, instructed in sheet loop and gait belt as simulated leg lifter so pt may self assist with less pain  Transfers  Overall transfer level Needs assistance  Equipment used Rolling walker (2 wheeled)  Transfers Sit to/from Stand  Sit to Stand Min guard;Min assist  General transfer comment Assist to control descent, stabilize, control descent. VCs safety, technique, hand placement.   Ambulation/Gait  Ambulation/Gait assistance Min guard  Gait Distance (Feet) 40 Feet  Assistive device Rolling walker (2 wheeled)  Gait Pattern/deviations Step-to  pattern;Antalgic  General Gait Details veral cues for safety, technique, decr speed, amb to  chair only because pt lunch arrived--no dizziness ater initial transition  Stairs Yes  Stairs assistance Min assist  Stair Management One rail Right;One rail Left;Step to pattern;Sideways  Number of Stairs 3  General stair comments cues for technique and sequence  PT - End of Session  Equipment Utilized During Treatment Gait belt  Activity Tolerance Patient tolerated treatment well  Patient left in bed;with call bell/phone within reach;with family/visitor present   PT - Assessment/Plan  PT Plan Current plan remains appropriate  PT Visit Diagnosis Pain;Difficulty in walking, not elsewhere classified (R26.2)  Pain - Right/Left Left  Pain - part of body Ankle and joints of foot;Hip  PT Frequency (ACUTE ONLY) Min 3X/week  Follow Up Recommendations Home health PT;Supervision/Assistance - 24 hour  PT equipment Rolling walker with 5" wheels;Hospital bed;3in1 (PT)  AM-PAC PT "6 Clicks" Daily Activity Outcome Measure  Difficulty turning over in bed (including adjusting bedclothes, sheets and blankets)? 2  Difficulty moving from lying on back to sitting on the side of the bed?  2  Difficulty sitting down on and standing up from a chair with arms (e.g., wheelchair, bedside commode, etc,.)? 2  Help needed moving to and from a bed to chair (including a wheelchair)? 3  Help needed walking in hospital room? 3  Help needed climbing 3-5 steps with a railing?  3  6 Click Score 15  Mobility G Code  CK  PT Goal Progression  Progress towards PT goals Progressing toward goals  Acute Rehab PT Goals  PT Goal Formulation With patient/family  Time  For Goal Achievement 06/19/18  Potential to Achieve Goals Good  PT Time Calculation  PT Start Time (ACUTE ONLY) 1426  PT Stop Time (ACUTE ONLY) 1500  PT Time Calculation (min) (ACUTE ONLY) 34 min  PT General Charges  $$ ACUTE PT VISIT 1 Visit  PT Treatments  $Gait  Training 23-37 mins

## 2018-06-08 MED ORDER — FAMOTIDINE 20 MG PO TABS: 20.0000 mg | ORAL_TABLET | Freq: Two times a day (BID) | ORAL | 0 refills | Status: DC

## 2018-06-08 MED ORDER — POLYETHYLENE GLYCOL 3350 17 G PO PACK: 17.0000 g | PACK | Freq: Every day | ORAL | 0 refills | Status: DC

## 2018-06-08 MED ORDER — BISACODYL 10 MG RE SUPP: 10.0000 mg | Freq: Every day | RECTAL | 0 refills | Status: DC | PRN

## 2018-06-08 NOTE — Progress Notes (Signed)
Physical Therapy Treatment Patient Details Name: Jamie Mcdowell MRN: [id number] DOB: 09/19/1961 Today's Date: 06/08/2018    History of Present Illness 57 yo female admitted with L superior/inferior pubic rami fractures, L toe fx after falling down stairs at home.     PT Comments    Asked to see pt again due to c/o dizziness on yesterday. Began session with gait training and stair training since pt was already up-see details below. Pt did fairly well. She did not c/o dizziness until she was assisted back into bed (supine). Since she reported spinning and c/o nausea, proceeded with vestibular testing. Supine head roll test (-) bilaterally. Observed some low amplitude torsional nystagmus bilaterally during [employer] and pt c/o spinning that lasted for ~5-10 seconds with L side appearing worse than R. Performed Epley maneuver x 1 for L side and x 1 for R side. Pt still c/o dizziness and nausea after treatment. She will likely require continued treatment if symptoms persist. Educated pt on compensation strategies (slow movements/position changes, avoiding turning head and body quickly and/or at the same time, etc). Due to limited mobility at present, pt will likely require 1-2 weeks of HHPT. Recommend OP PT vestibular eval/tx, if symptoms persist, once she is able to get in/out of home well enough.  If she could get a HHPT PT/OT with vestibular treatment experience, that would be ideal!     Follow Up Recommendations  Home health PT;Supervision/Assistance - 24 hour     Equipment Recommendations  Rolling walker with 5" wheels;Hospital bed;3in1 (PT)    Recommendations for Other Services       Precautions / Restrictions Precautions Precautions: Fall Required Braces or Orthoses: Other Brace/Splint Other Brace/Splint: L post op shoe Restrictions Weight Bearing Restrictions: No    Mobility  Bed Mobility Overal bed mobility: Needs Assistance Bed Mobility: Sidelying to Sit   Sidelying  to sit: Min assist   Sit to supine: Min assist   General bed mobility comments: assist for LEs. Reviewed possible use of leg lifter. Multiple transitions due to vestibular testing performed.   Transfers Overall transfer level: Needs assistance Equipment used: Rolling walker (2 wheeled) Transfers: Sit to/from Stand Sit to Stand: Min guard Stand pivot transfers: Min guard       General transfer comment: close guard for safety. VCs safety, technique, hand placement, compensation strategies for dizziness  Ambulation/Gait Ambulation/Gait assistance: Min guard Gait Distance (Feet): 30 Feet Assistive device: Rolling walker (2 wheeled) Gait Pattern/deviations: Step-to pattern     General Gait Details: close guard for safety. No c/o dizziness but pt did c/o nausea. slow gait speed.    Stairs Stairs: Yes Stairs assistance: Min guard Stair Management: One rail Right;Step to pattern;With crutches Number of Stairs: 2 General stair comments: up and over portable steps with 1 crutch, 1 rail. VCs safety, technique, sequence. Issued handout for pt and husband to review as well.    Wheelchair Mobility    Modified Rankin (Stroke Patients Only)       Balance Overall balance assessment: History of Falls;Needs assistance           Standing balance-Leahy Scale: Poor Standing balance comment: requiring RW presently                            Cognition Arousal/Alertness: Awake/alert Behavior During Therapy: Anxious Overall Cognitive Status: Within Functional Limits for tasks assessed  General Comments: pt is anxious on today. tearful at times. also shivering at end of session due to feeling cold-warm blanket given and increased temp in room      Exercises      General Comments        Pertinent Vitals/Pain Pain Assessment: 0-10 Pain Score: 5  Pain Location: L groin, L toe Pain Descriptors / Indicators:  Sore;Aching Pain Intervention(s): Monitored during session;Limited activity within patient's tolerance    Home Living                      Prior Function            PT Goals (current goals can now be found in the care plan section) Progress towards PT goals: Progressing toward goals    Frequency    Min 3X/week      PT Plan Current plan remains appropriate    Co-evaluation              AM-PAC PT "6 Clicks" Daily Activity  Outcome Measure  Difficulty turning over in bed (including adjusting bedclothes, sheets and blankets)?: A Lot Difficulty moving from lying on back to sitting on the side of the bed? : A Lot Difficulty sitting down on and standing up from a chair with arms (e.g., wheelchair, bedside commode, etc,.)?: A Lot Help needed moving to and from a bed to chair (including a wheelchair)?: A Little Help needed walking in hospital room?: A Little Help needed climbing 3-5 steps with a railing? : A Little 6 Click Score: 15    End of Session Equipment Utilized During Treatment: Gait belt Activity Tolerance: Patient tolerated treatment well(still some c/o dizziness, nausea) Patient left: in chair;with call bell/phone within reach;with family/visitor present   PT Visit Diagnosis: Pain;Difficulty in walking, not elsewhere classified (R26.2) Pain - Right/Left: Left Pain - part of body: Ankle and joints of foot;Hip     Time: 1121-1218 PT Time Calculation (min) (ACUTE ONLY): 57 min  Charges:  $Gait Training: 23-37 mins $Therapeutic Activity: 38-52 mins                    G Codes:          Weston Anna, MPT Pager: [phone]

## 2018-06-08 NOTE — Progress Notes (Addendum)
Discharge Planning:Faxed dc summary and HH orders to [facility] to continue to arrange [facility]. Pt received DME. Jonnie Finner RN CCM Case Mgmt phone [phone]

## 2018-06-08 NOTE — Discharge Summary (Signed)
Physician Discharge Summary  Jamie Mcdowell:[id number] DOB: Nov 06, 1961 DOA: 06/04/2018  PCP: Carol Ada, MD  Admit date: 06/04/2018 Discharge date: 06/08/2018  Admitted From: home  Disposition:  home   Recommendations for Outpatient Follow-up:  1. Dr [name] in 3 wks  Home Health:  ordered  Equipment/Devices:  ordered    Discharge Condition:  stable   CODE STATUS:  Full code   Consultations:  Phone consult with ortho in ED    Discharge Diagnoses:  Principal Problem:   Closed fracture of multiple pubic rami, left, initial encounter ([place]) Active Problems:   Iron deficiency anemia   Closed fracture of phalanx of left second toe   Hypokalemia   HTN (hypertension)   BMI 30.0-30.9,adult   Acute urinary retention     Brief Summary: Jamie Mcdowell  is an 57 y.o.femalewith a PMH of hypertension who fell down the stairs resulting in left superior/inferior pubic rami fractures and a left second toe fracture.  Hospital Course:  Principal Problem:   Closed fracture of multiple pubic rami, left, initial encounter    Closed fracture of phalanx of left second toe - conservative management recommended by ortho - she is choosing to use Tylenol for pain relief - will go home with HHPT/OT and a hospital bed  Active Problems:   Iron deficiency anemia - receives months Iron infusions by dr Marin Olp- given an infusion while in the hospital  Dizziness - 1 episode yesterday and subsequently no further dizziness noted  Vomiting  - she was started on pepcid in addition to PPI- no further complaints of this    HTN (hypertension)  - Clonidne  Obesity Body mass index is 30.92 kg/m.     Hypokalemia - replacement ordered      Acute urinary retention - due to fractures- has resolved  Discharge Exam: Vitals:   06/07/18 2004 06/08/18 0609  BP: (!) 153/97 122/68  Pulse:  69  Resp:  16  Temp:  99.4 F (37.4 C)  SpO2:  96%   Vitals:   06/07/18 1330 06/07/18 2001  06/07/18 2004 06/08/18 0609  BP: 140/84 (!) 172/98 (!) 153/97 122/68  Pulse: 76 83  69  Resp: 14 20  16   Temp: 98.3 F (36.8 C) 98.4 F (36.9 C)  99.4 F (37.4 C)  TempSrc: Oral Oral  Oral  SpO2: 100% 100%  96%  Weight:      Height:        General: Pt is alert, awake, not in acute distress Cardiovascular: RRR, S1/S2 +, no rubs, no gallops Respiratory: CTA bilaterally, no wheezing, no rhonchi Abdominal: Soft, NT, ND, bowel sounds + Extremities: no edema, no cyanosis   Discharge Instructions  Discharge Instructions    Diet - low sodium heart healthy   Complete by:  As directed    Increase activity slowly   Complete by:  As directed      Allergies as of 06/08/2018      Reactions   Penicillins Anaphylaxis   Ciprocinonide [fluocinolone] Other (See Comments)   unknown   Sulfa Antibiotics Rash      Medication List    TAKE these medications   acetaminophen 500 MG tablet Commonly known as:  TYLENOL Take 1,000 mg by mouth every 6 (six) hours as needed for moderate pain.   bisacodyl 10 MG suppository Commonly known as:  DULCOLAX Place 1 suppository (10 mg total) rectally daily as needed for moderate constipation.   cloNIDine 0.2 MG tablet Commonly known as:  CATAPRES Take 1 tablet (0.2 mg total) by mouth 2 (two) times daily. What changed:    how much to take  when to take this  additional instructions   diphenhydrAMINE 25 MG tablet Commonly known as:  SOMINEX Take 25 mg by mouth every other day.   famotidine 20 MG tablet Commonly known as:  PEPCID Take 1 tablet (20 mg total) by mouth 2 (two) times daily.   omeprazole 20 MG capsule Commonly known as:  PRILOSEC Take 20 mg by mouth daily.   polyethylene glycol packet Commonly known as:  MIRALAX / GLYCOLAX Take 17 g by mouth daily.   Vitamin D (Ergocalciferol) 50000 units Caps capsule Commonly known as:  DRISDOL Take 1 capsule (50,000 Units total) by mouth every 7 (seven) days. What changed:  when to  take this            Durable Medical Equipment  (From admission, onward)        Start     Ordered   06/06/18 0954  For home use only DME Bedside commode  Once    Question:  Patient needs a bedside commode to treat with the following condition  Answer:  Closed fracture of multiple pubic rami, left, initial encounter [facility])   06/06/18 0953   06/06/18 0952  For home use only DME Walker  Once    Question:  Patient needs a walker to treat with the following condition  Answer:  Closed fracture of multiple pubic rami, left, initial encounter [facility])   06/06/18 0953   06/06/18 0951  For home use only DME Hospital bed  Once    Question Answer Comment  Patient has (list medical condition): Pelvic fracture. Unable to climb steps to her bedroom.   Bed type Semi-electric      06/06/18 0951     Follow-up Information    [facility] Follow up.   Why:  They will contact you regarding agency that they arrange. Intake #[id number] Contact information: [phone]       Marybelle Killings, MD. Schedule an appointment as soon as possible for a visit in 2 week(s).   Specialty:  Orthopedic Surgery Why:  ED doctor spoke with him over the phone about your case. Please follow up with him in 2-3 wks to ensure fractures are healing.  Contact information: [place] [address] [phone]        Carol Ada, MD Follow up in 1 week(s).   Specialty:  Family Medicine Contact information: [address], [address] [place] [address] [phone]          Allergies  Allergen Reactions  . Penicillins Anaphylaxis  . Ciprocinonide [Fluocinolone] Other (See Comments)    unknown  . Sulfa Antibiotics Rash     Procedures/Studies:    Dg Chest 2 View  Result Date: 06/05/2018 CLINICAL DATA:  Fall down steps.  Left groin pain. EXAM: CHEST - 2 VIEW COMPARISON:  Radiograph 06/23/2017 FINDINGS: The cardiomediastinal contours are normal. Moderate to large hiatal hernia.  Pulmonary vasculature is normal. No consolidation, pleural effusion, or pneumothorax. No acute osseous abnormalities are seen. IMPRESSION: 1. No acute chest findings. 2. Hiatal hernia. Electronically Signed   By: [name] M.D.   On: 06/05/2018 00:17   Ct Head Wo Contrast  Result Date: 06/05/2018 CLINICAL DATA:  Fall with headache and nausea EXAM: CT HEAD WITHOUT CONTRAST CT CERVICAL SPINE WITHOUT CONTRAST TECHNIQUE: Multidetector CT imaging of the head and cervical spine was performed following  the standard protocol without intravenous contrast. Multiplanar CT image reconstructions of the cervical spine were also generated. COMPARISON:  None. FINDINGS: CT HEAD FINDINGS Brain: There is no mass, hemorrhage or extra-axial collection. The size and configuration of the ventricles and extra-axial CSF spaces are normal. There is no acute or chronic infarction. The brain parenchyma is normal. Vascular: No abnormal hyperdensity of the major intracranial arteries or dural venous sinuses. No intracranial atherosclerosis. Skull: The visualized skull base, calvarium and extracranial soft tissues are normal. Sinuses/Orbits: No fluid levels or advanced mucosal thickening of the visualized paranasal sinuses. No mastoid or middle ear effusion. The orbits are normal. CT CERVICAL SPINE FINDINGS Alignment: Mild grade 1 anterolisthesis at C3-C4, likely due to facet hypertrophy. Facets are aligned. Occipital condyles are normally positioned. Skull base and vertebrae: No acute fracture. Soft tissues and spinal canal: No prevertebral fluid or swelling. No visible canal hematoma. Disc levels: Severe right C2-3 facet hypertrophy. No spinal canal or neural foraminal stenosis. Otherwise mild multilevel degenerative disc disease. Upper chest: No pneumothorax, pulmonary nodule or pleural effusion. Other: Normal visualized paraspinal cervical soft tissues. IMPRESSION: 1. Normal brain. 2. No acute abnormality of the cervical spine. 3.  Mild multilevel degenerative disc disease. Electronically Signed   By: Ulyses Jarred M.D.   On: 06/05/2018 00:54   Ct Cervical Spine Wo Contrast  Result Date: 06/05/2018 CLINICAL DATA:  Fall with headache and nausea EXAM: CT HEAD WITHOUT CONTRAST CT CERVICAL SPINE WITHOUT CONTRAST TECHNIQUE: Multidetector CT imaging of the head and cervical spine was performed following the standard protocol without intravenous contrast. Multiplanar CT image reconstructions of the cervical spine were also generated. COMPARISON:  None. FINDINGS: CT HEAD FINDINGS Brain: There is no mass, hemorrhage or extra-axial collection. The size and configuration of the ventricles and extra-axial CSF spaces are normal. There is no acute or chronic infarction. The brain parenchyma is normal. Vascular: No abnormal hyperdensity of the major intracranial arteries or dural venous sinuses. No intracranial atherosclerosis. Skull: The visualized skull base, calvarium and extracranial soft tissues are normal. Sinuses/Orbits: No fluid levels or advanced mucosal thickening of the visualized paranasal sinuses. No mastoid or middle ear effusion. The orbits are normal. CT CERVICAL SPINE FINDINGS Alignment: Mild grade 1 anterolisthesis at C3-C4, likely due to facet hypertrophy. Facets are aligned. Occipital condyles are normally positioned. Skull base and vertebrae: No acute fracture. Soft tissues and spinal canal: No prevertebral fluid or swelling. No visible canal hematoma. Disc levels: Severe right C2-3 facet hypertrophy. No spinal canal or neural foraminal stenosis. Otherwise mild multilevel degenerative disc disease. Upper chest: No pneumothorax, pulmonary nodule or pleural effusion. Other: Normal visualized paraspinal cervical soft tissues. IMPRESSION: 1. Normal brain. 2. No acute abnormality of the cervical spine. 3. Mild multilevel degenerative disc disease. Electronically Signed   By: Ulyses Jarred M.D.   On: 06/05/2018 00:54   Dg Knee Complete 4  Views Left  Result Date: 06/05/2018 CLINICAL DATA:  Left knee pain after fall. EXAM: LEFT KNEE - COMPLETE 4+ VIEW COMPARISON:  None. FINDINGS: No evidence of fracture, dislocation, or joint effusion. Minimal osteoarthritis with peripheral spurring and spurring of the tibial spines. Incidental growth arrest line in the distal femur. Soft tissues are unremarkable. IMPRESSION: Mild degenerative change of the left knee without acute fracture. Electronically Signed   By: [name] M.D.   On: 06/05/2018 03:35   Dg Foot 2 Views Left  Result Date: 06/05/2018 CLINICAL DATA:  Fall with left second toe pain EXAM: LEFT FOOT - 2  VIEW COMPARISON:  None. FINDINGS: There is a minimally displaced, oblique fracture of the midshaft of the left second proximal phalanx. This is best demonstrated on the lateral projection. No other fracture or dislocation. IMPRESSION: Minimally displaced oblique fracture of the proximal phalanx of the left second toe. Electronically Signed   By: Ulyses Jarred M.D.   On: 06/05/2018 00:44   Dg Hip Unilat With Pelvis 2-3 Views Left  Result Date: 06/05/2018 CLINICAL DATA:  Fall down stairs with left groin pain. Initial encounter. EXAM: DG HIP (WITH OR WITHOUT PELVIS) 2-3V LEFT COMPARISON:  None. FINDINGS: Displaced mildly comminuted left inferior pubic ramus fracture. Minimally displaced superior pubic ramus fracture at the puboacetabular junction. Pubic symphysis remains congruent. No additional acute fracture of the pelvis or left. IMPRESSION: Left superior and inferior pubic rami fractures, inferior ramus fracture is displaced and comminuted. Electronically Signed   By: [name] M.D.   On: 06/05/2018 00:16     The results of significant diagnostics from this hospitalization (including imaging, microbiology, ancillary and laboratory) are listed below for reference.     Microbiology: No results found for this or any previous visit (from the past 240 hour(s)).   Labs: BNP  (last 3 results) No results for input(s): BNP in the last 8760 hours. Basic Metabolic Panel: Recent Labs  Lab 06/05/18 0033  NA 141  K 3.4*  CL 112*  CO2 22  GLUCOSE 106*  BUN 18  CREATININE 0.80  CALCIUM 8.5*   Liver Function Tests: No results for input(s): AST, ALT, ALKPHOS, BILITOT, PROT, ALBUMIN in the last 168 hours. No results for input(s): LIPASE, AMYLASE in the last 168 hours. No results for input(s): AMMONIA in the last 168 hours. CBC: Recent Labs  Lab 06/05/18 0033 06/06/18 0507  WBC 10.2 4.7  NEUTROABS 8.6* 2.4  HGB 10.1* 9.5*  HCT 31.0* 30.3*  MCV 93.4 95.0  PLT 313 267   Cardiac Enzymes: No results for input(s): CKTOTAL, CKMB, CKMBINDEX, TROPONINI in the last 168 hours. BNP: Invalid input(s): POCBNP CBG: No results for input(s): GLUCAP in the last 168 hours. D-Dimer No results for input(s): DDIMER in the last 72 hours. Hgb A1c No results for input(s): HGBA1C in the last 72 hours. Lipid Profile No results for input(s): CHOL, HDL, LDLCALC, TRIG, CHOLHDL, LDLDIRECT in the last 72 hours. Thyroid function studies No results for input(s): TSH, T4TOTAL, T3FREE, THYROIDAB in the last 72 hours.  Invalid input(s): FREET3 Anemia work up Recent Labs    06/06/18 0507  FERRITIN 85  TIBC 271  IRON 35  RETICCTPCT 8.6*   Urinalysis    Component Value Date/Time   COLORURINE STRAW (A) 06/07/2018 1932   APPEARANCEUR CLEAR 06/07/2018 1932   LABSPEC 1.005 06/07/2018 1932   PHURINE 5.0 06/07/2018 1932   GLUCOSEU NEGATIVE 06/07/2018 1932   HGBUR NEGATIVE 06/07/2018 1932   BILIRUBINUR NEGATIVE 06/07/2018 1932   KETONESUR NEGATIVE 06/07/2018 1932   PROTEINUR NEGATIVE 06/07/2018 1932   UROBILINOGEN 0.2 04/11/2011 1740   NITRITE NEGATIVE 06/07/2018 1932   LEUKOCYTESUR TRACE (A) 06/07/2018 1932   Sepsis Labs Invalid input(s): PROCALCITONIN,  WBC,  LACTICIDVEN Microbiology No results found for this or any previous visit (from the past 240 hour(s)).   Time  coordinating discharge in minutes: 65  SIGNED:   Debbe Odea, MD  [facility] 06/08/2018, 10:12 AM Pager   If 7PM-7AM, please contact night-coverage [email or web address] Password [email or web address]

## 2018-06-08 NOTE — Progress Notes (Signed)
Pt was discharged home today. Instructions were reviewed with patient, and questions were answered. Pt was taken to main entrance via wheelchair by NT.  

## 2018-06-09 ENCOUNTER — Other Ambulatory Visit: Payer: Self-pay | Admitting: Internal Medicine

## 2018-06-09 MED ORDER — DIAZEPAM 5 MG PO TABS
5.0000 mg | ORAL_TABLET | Freq: Three times a day (TID) | ORAL | 0 refills | Status: DC | PRN
Start: 2018-06-09 — End: 2018-08-14

## 2018-06-09 NOTE — Progress Notes (Signed)
Received a phone call from my admin assistant that the patient had called to request a prescription for Valium.  We had discussed treating her pain with Valium while she was in the hospital because it was more effective for her (likely a combination of muscle relaxant and anti-anxiety effects). Authorized #[id number] Valium to take Q 8 hours PRN. May need to pick up a written prescription as I could not e-prescribe.  Margreta Journey Rama 06/09/2018 5:44 PM

## 2018-06-10 ENCOUNTER — Telehealth (INDEPENDENT_AMBULATORY_CARE_PROVIDER_SITE_OTHER): Payer: Self-pay | Admitting: Orthopaedic Surgery

## 2018-06-10 NOTE — Telephone Encounter (Signed)
Everlean Patterson Smith-nurse case manager with [employer] called advised she is following patient for case management for about a month. The number to contact Everlean Patterson is [phone]

## 2018-06-11 NOTE — Telephone Encounter (Signed)
noted 

## 2018-06-13 ENCOUNTER — Telehealth: Payer: Self-pay | Admitting: *Deleted

## 2018-07-11 ENCOUNTER — Inpatient Hospital Stay: Payer: Managed Care, Other (non HMO)

## 2018-07-11 ENCOUNTER — Other Ambulatory Visit: Payer: Self-pay | Admitting: *Deleted

## 2018-07-11 ENCOUNTER — Inpatient Hospital Stay: Payer: Managed Care, Other (non HMO) | Attending: Hematology & Oncology | Admitting: Family

## 2018-07-11 VITALS — BP 133/64 | HR 84 | Temp 98.3°F | Wt 173.2 lb

## 2018-07-11 DIAGNOSIS — D509 Iron deficiency anemia, unspecified: Secondary | ICD-10-CM | POA: Insufficient documentation

## 2018-07-11 DIAGNOSIS — D500 Iron deficiency anemia secondary to blood loss (chronic): Secondary | ICD-10-CM

## 2018-07-11 DIAGNOSIS — D649 Anemia, unspecified: Secondary | ICD-10-CM

## 2018-07-11 LAB — CBC WITH DIFFERENTIAL (CANCER CENTER ONLY)
BASOS ABS: 0 10*3/uL (ref 0.0–0.1)
BASOS PCT: 1 %
Eosinophils Absolute: 0.1 10*3/uL (ref 0.0–0.5)
Eosinophils Relative: 1 %
HEMATOCRIT: 25 % — AB (ref 34.8–46.6)
HEMOGLOBIN: 7.4 g/dL — AB (ref 11.6–15.9)
Lymphocytes Relative: 40 %
Lymphs Abs: 1.8 10*3/uL (ref 0.9–3.3)
MCH: 28.9 pg (ref 26.0–34.0)
MCHC: 29.6 g/dL — AB (ref 32.0–36.0)
MCV: 97.7 fL (ref 81.0–101.0)
MONOS PCT: 7 %
Monocytes Absolute: 0.3 10*3/uL (ref 0.1–0.9)
NEUTROS ABS: 2.3 10*3/uL (ref 1.5–6.5)
NEUTROS PCT: 51 %
Platelet Count: 362 10*3/uL (ref 145–400)
RBC: 2.56 MIL/uL — AB (ref 3.70–5.32)
RDW: 14.9 % (ref 11.1–15.7)
WBC: 4.4 10*3/uL (ref 3.9–10.0)

## 2018-07-11 LAB — PREPARE RBC (CROSSMATCH)

## 2018-07-11 MED ORDER — FERUMOXYTOL INJECTION 510 MG/17 ML
510.0000 mg | Freq: Once | INTRAVENOUS | Status: AC
  Administered 2018-07-11: 510 mg via INTRAVENOUS
  Filled 2018-07-11: qty 17

## 2018-07-11 NOTE — Progress Notes (Signed)
Hematology and Oncology Follow Up Visit  Jamie Mcdowell [id number] 04-Sep-1961 57 y.o. 07/11/2018   Principle Diagnosis:  Recurrent iron deficiency anemia  Current Therapy:   IV iron as indicated - last receivedin May 2019   Interim History: Jamie Mcdowell is here today with her daughter Jamie Mcdowell for follow-up and IV iron. She fell down her garage stairs at home and fractured her pelvis and toe. She is quite pale today and symptomatic with fatigue, dizziness, weakness, palpitations, SOB, craving cold drinks and headaches.  She states that PT has put her on hold due to these symptoms.  She had another fall Monday at home she states due to dizziness but thankfully it was in a carpeted area and she was not injured.  She has had dark tarry stools as well.  Hgb is down to 7.4 with an MCV of 97. Iron saturation was low at 13% 3 weeks ago with Hgb of 9.5. This is no doubt even lower now.  She has had no fever, chills, n/v, cough, rash, chest pain, abdominal pain or changes in bladder habits. .  No swelling, tenderness, numbness or tingling in her extremities. No lymphadenopathy noted on exam.  Her appetite comes and goes but she is staying well hydrated. Her weight is down 7 lbs since her last visit.   ECOG Performance Status: 1 - Symptomatic but completely ambulatory  Medications:  Allergies as of 07/11/2018      Reactions   Penicillins Anaphylaxis   Ciprocinonide [fluocinolone] Other (See Comments)   unknown   Sulfa Antibiotics Rash      Medication List        Accurate as of 07/11/18  2:28 PM. Always use your most recent med list.          acetaminophen 500 MG tablet Commonly known as:  TYLENOL Take 1,000 mg by mouth every 6 (six) hours as needed for moderate pain.   bisacodyl 10 MG suppository Commonly known as:  DULCOLAX Place 1 suppository (10 mg total) rectally daily as needed for moderate constipation.   cloNIDine 0.2 MG tablet Commonly known as:  CATAPRES Take 1 tablet (0.2  mg total) by mouth 2 (two) times daily.   diazepam 5 MG tablet Commonly known as:  VALIUM Take 1 tablet (5 mg total) by mouth every 8 (eight) hours as needed for anxiety.   diphenhydrAMINE 25 MG tablet Commonly known as:  SOMINEX Take 25 mg by mouth every other day.   famotidine 20 MG tablet Commonly known as:  PEPCID Take 1 tablet (20 mg total) by mouth 2 (two) times daily.   omeprazole 20 MG capsule Commonly known as:  PRILOSEC Take 20 mg by mouth daily.   polyethylene glycol packet Commonly known as:  MIRALAX / GLYCOLAX Take 17 g by mouth daily.   Vitamin D (Ergocalciferol) 50000 units Caps capsule Commonly known as:  DRISDOL Take 1 capsule (50,000 Units total) by mouth every 7 (seven) days.       Allergies:  Allergies  Allergen Reactions  . Penicillins Anaphylaxis  . Ciprocinonide [Fluocinolone] Other (See Comments)    unknown  . Sulfa Antibiotics Rash    Past Medical History, Surgical history, Social history, and Family History were reviewed and updated.  Review of Systems: All other 10 point review of systems is negative.   Physical Exam:  vitals were not taken for this visit.   Wt Readings from Last 3 Encounters:  06/05/18 180 lb 1.9 oz (81.7 kg)  03/13/18 181 lb (  82.1 kg)  01/30/18 180 lb (81.6 kg)    Ocular: Sclerae unicteric, pupils equal, round and reactive to light Ear-nose-throat: Oropharynx clear, dentition fair Lymphatic: No cervical, supraclavicular or axillary adenopathy Lungs no rales or rhonchi, good excursion bilaterally Heart regular rate and rhythm, no murmur appreciated Abd soft, nontender, positive bowel sounds, no liver or spleen tip palpated on exam, no fluid wave  MSK no focal spinal tenderness, no joint edema Neuro: non-focal, well-oriented, appropriate affect Breasts: Deferred   Lab Results  Component Value Date   WBC 4.7 06/06/2018   HGB 9.5 (L) 06/06/2018   HCT 30.3 (L) 06/06/2018   MCV 95.0 06/06/2018   PLT 267  06/06/2018   Lab Results  Component Value Date   FERRITIN 85 06/06/2018   IRON 35 06/06/2018   TIBC 271 06/06/2018   UIBC 236 06/06/2018   IRONPCTSAT 13 06/06/2018   Lab Results  Component Value Date   RETICCTPCT 8.6 (H) 06/06/2018   RBC 3.19 (L) 06/06/2018   RBC 3.19 (L) 06/06/2018   RETICCTABS 139.2 12/12/2015   No results found for: KPAFRELGTCHN, LAMBDASER, KAPLAMBRATIO No results found for: Kandis Cocking, [facility] Lab Results  Component Value Date   TOTALPROTELP 6.8 02/28/2012   ALBUMINELP 57.8 02/28/2012   A1GS 4.0 02/28/2012   A2GS 11.7 02/28/2012   BETS 6.7 02/28/2012   BETA2SER 5.2 02/28/2012   GAMS 14.6 02/28/2012   MSPIKE NOT DET 02/28/2012   SPEI * 02/28/2012     Chemistry      Component Value Date/Time   NA 141 06/05/2018 0033   NA 148 (H) 12/19/2017 1355   NA 143 06/28/2017 1114   K 3.4 (L) 06/05/2018 0033   K 4.1 12/19/2017 1355   K 4.0 06/28/2017 1114   CL 112 (H) 06/05/2018 0033   CL 108 12/19/2017 1355   CO2 22 06/05/2018 0033   CO2 27 12/19/2017 1355   CO2 23 06/28/2017 1114   BUN 18 06/05/2018 0033   BUN 12 12/19/2017 1355   BUN 11.0 06/28/2017 1114   CREATININE 0.80 06/05/2018 0033   CREATININE 0.83 05/01/2018 1408   CREATININE 0.9 12/19/2017 1355   CREATININE 0.7 06/28/2017 1114      Component Value Date/Time   CALCIUM 8.5 (L) 06/05/2018 0033   CALCIUM 9.5 12/19/2017 1355   CALCIUM 9.5 06/28/2017 1114   ALKPHOS 91 05/01/2018 1408   ALKPHOS 88 (H) 12/19/2017 1355   ALKPHOS 88 06/28/2017 1114   AST 16 05/01/2018 1408   AST 16 06/28/2017 1114   ALT 15 05/01/2018 1408   ALT 16 12/19/2017 1355   ALT 13 06/28/2017 1114   BILITOT 0.3 05/01/2018 1408   BILITOT 0.30 06/28/2017 1114      Impression and Plan: Jamie Mcdowell is a very pleasant 57 yo caucasian female with iron deficiency anemia. Unfortunately, she recently fell and fractured her pelvis and toe. She is quite symptomatic with anemia as mentioned above but not in any visible  distress at this time.   We will give her IV iron today and then 2 units of blood on Monday.  We will then plan to see her back in 1 month for follow-up.  She is in agreement with the plan and will contact our office with any questions or concerns. She will go to the ED in the event of an emergency.   [name], NP 7/19/20192:28 PM

## 2018-07-11 NOTE — Patient Instructions (Signed)

## 2018-07-14 ENCOUNTER — Inpatient Hospital Stay: Payer: Managed Care, Other (non HMO)

## 2018-07-14 ENCOUNTER — Other Ambulatory Visit: Payer: Self-pay

## 2018-07-14 DIAGNOSIS — D649 Anemia, unspecified: Secondary | ICD-10-CM

## 2018-07-14 DIAGNOSIS — D509 Iron deficiency anemia, unspecified: Secondary | ICD-10-CM | POA: Diagnosis not present

## 2018-07-14 DIAGNOSIS — D500 Iron deficiency anemia secondary to blood loss (chronic): Secondary | ICD-10-CM

## 2018-07-14 MED ORDER — ACETAMINOPHEN 325 MG PO TABS
650.0000 mg | ORAL_TABLET | Freq: Once | ORAL | Status: AC
  Administered 2018-07-14: 650 mg via ORAL

## 2018-07-14 MED ORDER — DIPHENHYDRAMINE HCL 25 MG PO CAPS
25.0000 mg | ORAL_CAPSULE | Freq: Once | ORAL | Status: AC
  Administered 2018-07-14: 25 mg via ORAL

## 2018-07-14 MED ORDER — DIPHENHYDRAMINE HCL 25 MG PO CAPS
ORAL_CAPSULE | ORAL | Status: AC
  Filled 2018-07-14: qty 1

## 2018-07-14 MED ORDER — ACETAMINOPHEN 325 MG PO TABS
ORAL_TABLET | ORAL | Status: AC
  Filled 2018-07-14: qty 2

## 2018-07-14 NOTE — Patient Instructions (Signed)

## 2018-07-15 ENCOUNTER — Encounter: Payer: Self-pay | Admitting: Hematology & Oncology

## 2018-07-15 LAB — TYPE AND SCREEN
ABO/RH(D): A NEG
Antibody Screen: POSITIVE
DONOR AG TYPE: NEGATIVE
DONOR AG TYPE: NEGATIVE
PT AG TYPE: NEGATIVE
UNIT DIVISION: 0
Unit division: 0

## 2018-07-15 LAB — BPAM RBC
BLOOD PRODUCT EXPIRATION DATE: 201908162359
Blood Product Expiration Date: 201908172359
ISSUE DATE / TIME: 201907220800
ISSUE DATE / TIME: 201907220800
Unit Type and Rh: 600
Unit Type and Rh: 600

## 2018-07-18 ENCOUNTER — Inpatient Hospital Stay: Payer: Managed Care, Other (non HMO)

## 2018-07-18 DIAGNOSIS — D509 Iron deficiency anemia, unspecified: Secondary | ICD-10-CM | POA: Diagnosis not present

## 2018-07-18 DIAGNOSIS — D500 Iron deficiency anemia secondary to blood loss (chronic): Secondary | ICD-10-CM

## 2018-07-18 MED ORDER — SODIUM CHLORIDE 0.9 % IV SOLN
510.0000 mg | Freq: Once | INTRAVENOUS | Status: AC
  Administered 2018-07-18: 510 mg via INTRAVENOUS
  Filled 2018-07-18: qty 17

## 2018-07-18 MED ORDER — SODIUM CHLORIDE 0.9 % IV SOLN
INTRAVENOUS | Status: DC
  Administered 2018-07-18: 13:00:00 via INTRAVENOUS
  Filled 2018-07-18 (×2): qty 250

## 2018-07-18 NOTE — Patient Instructions (Signed)

## 2018-07-21 ENCOUNTER — Telehealth: Payer: Self-pay | Admitting: *Deleted

## 2018-07-21 ENCOUNTER — Other Ambulatory Visit: Payer: Self-pay | Admitting: *Deleted

## 2018-07-21 DIAGNOSIS — D500 Iron deficiency anemia secondary to blood loss (chronic): Secondary | ICD-10-CM

## 2018-07-21 NOTE — Telephone Encounter (Signed)
Patient c/o continued episodes of dizziness and shortness of breath. She has recently received two units of PRBC and two doses of injectafer for her anemia. She doesn't feel much better.  Reviewed with patient that it will take several weeks for her symptoms to improve with iron. She should have had some bump after the transfusion. Offered to bring her in for a CBC to evaluate response to transfusion. Also reviewed with patient that if she develops shortness of breath again, she should seek emergent assessment as she is at increased risk for clots with her pelvic injury. She understood.   Appointment made to check blood counts this Wednesday. She will follow up with her orthopedist to make sure they do not want specific workup related to her symptoms.

## 2018-07-23 ENCOUNTER — Inpatient Hospital Stay: Payer: Managed Care, Other (non HMO)

## 2018-07-23 DIAGNOSIS — D509 Iron deficiency anemia, unspecified: Secondary | ICD-10-CM | POA: Diagnosis not present

## 2018-07-23 DIAGNOSIS — D500 Iron deficiency anemia secondary to blood loss (chronic): Secondary | ICD-10-CM

## 2018-07-23 LAB — CBC WITH DIFFERENTIAL (CANCER CENTER ONLY)
BASOS PCT: 1 %
Basophils Absolute: 0 10*3/uL (ref 0.0–0.1)
EOS ABS: 0.1 10*3/uL (ref 0.0–0.5)
Eosinophils Relative: 2 %
HEMATOCRIT: 33.8 % — AB (ref 34.8–46.6)
Hemoglobin: 10.3 g/dL — ABNORMAL LOW (ref 11.6–15.9)
LYMPHS ABS: 1.8 10*3/uL (ref 0.9–3.3)
Lymphocytes Relative: 39 %
MCH: 29.7 pg (ref 26.0–34.0)
MCHC: 30.5 g/dL — ABNORMAL LOW (ref 32.0–36.0)
MCV: 97.4 fL (ref 81.0–101.0)
Monocytes Absolute: 0.3 10*3/uL (ref 0.1–0.9)
Monocytes Relative: 7 %
NEUTROS ABS: 2.3 10*3/uL (ref 1.5–6.5)
NEUTROS PCT: 51 %
Platelet Count: 321 10*3/uL (ref 145–400)
RBC: 3.47 MIL/uL — AB (ref 3.70–5.32)
RDW: 15.7 % (ref 11.1–15.7)
WBC: 4.4 10*3/uL (ref 3.9–10.0)

## 2018-08-14 ENCOUNTER — Inpatient Hospital Stay: Payer: Managed Care, Other (non HMO) | Attending: Hematology & Oncology

## 2018-08-14 ENCOUNTER — Inpatient Hospital Stay (HOSPITAL_BASED_OUTPATIENT_CLINIC_OR_DEPARTMENT_OTHER): Payer: Managed Care, Other (non HMO) | Admitting: Family

## 2018-08-14 ENCOUNTER — Other Ambulatory Visit: Payer: Self-pay

## 2018-08-14 VITALS — BP 156/86 | HR 72 | Temp 98.2°F | Resp 18 | Wt 173.0 lb

## 2018-08-14 DIAGNOSIS — D509 Iron deficiency anemia, unspecified: Secondary | ICD-10-CM | POA: Insufficient documentation

## 2018-08-14 DIAGNOSIS — D500 Iron deficiency anemia secondary to blood loss (chronic): Secondary | ICD-10-CM

## 2018-08-14 DIAGNOSIS — D649 Anemia, unspecified: Secondary | ICD-10-CM

## 2018-08-14 DIAGNOSIS — E559 Vitamin D deficiency, unspecified: Secondary | ICD-10-CM

## 2018-08-14 LAB — CMP (CANCER CENTER ONLY)
ALBUMIN: 3.6 g/dL (ref 3.5–5.0)
ALT: 17 U/L (ref 10–47)
ANION GAP: 5 (ref 5–15)
AST: 12 U/L (ref 11–38)
Alkaline Phosphatase: 86 U/L — ABNORMAL HIGH (ref 26–84)
BILIRUBIN TOTAL: 0.4 mg/dL (ref 0.2–1.6)
BUN: 16 mg/dL (ref 7–22)
CALCIUM: 8.8 mg/dL (ref 8.0–10.3)
CO2: 27 mmol/L (ref 18–33)
Chloride: 111 mmol/L — ABNORMAL HIGH (ref 98–108)
Creatinine: 1 mg/dL (ref 0.60–1.20)
Glucose, Bld: 96 mg/dL (ref 73–118)
Potassium: 3.7 mmol/L (ref 3.3–4.7)
Sodium: 143 mmol/L (ref 128–145)
TOTAL PROTEIN: 6.8 g/dL (ref 6.4–8.1)

## 2018-08-14 LAB — CBC WITH DIFFERENTIAL (CANCER CENTER ONLY)
BASOS ABS: 0 10*3/uL (ref 0.0–0.1)
BASOS PCT: 0 %
EOS ABS: 0.1 10*3/uL (ref 0.0–0.5)
EOS PCT: 1 %
HCT: 34.5 % — ABNORMAL LOW (ref 34.8–46.6)
Hemoglobin: 10.7 g/dL — ABNORMAL LOW (ref 11.6–15.9)
Lymphocytes Relative: 25 %
Lymphs Abs: 1.4 10*3/uL (ref 0.9–3.3)
MCH: 28.8 pg (ref 26.0–34.0)
MCHC: 31 g/dL — ABNORMAL LOW (ref 32.0–36.0)
MCV: 93 fL (ref 81.0–101.0)
Monocytes Absolute: 0.4 10*3/uL (ref 0.1–0.9)
Monocytes Relative: 7 %
Neutro Abs: 3.5 10*3/uL (ref 1.5–6.5)
Neutrophils Relative %: 67 %
PLATELETS: 352 10*3/uL (ref 145–400)
RBC: 3.71 MIL/uL (ref 3.70–5.32)
RDW: 13.9 % (ref 11.1–15.7)
WBC: 5.3 10*3/uL (ref 3.9–10.0)

## 2018-08-14 NOTE — Progress Notes (Signed)
Hematology and Oncology Follow Up Visit  Jamie Mcdowell [id number] [birth year] 57 y.o. 08/14/2018   Principle Diagnosis:  Recurrent iron deficiency anemia  Current Therapy:   IV iron as indicated - last receivedin May 2019   Interim History: Jamie Mcdowell is here today for follow-up. 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  Hgb is up to 10.7, MCV 93. She has still had some mild fatigue, SOB with exertion and palpitations.  She has not noted any episodes of bleeding, no bruising or petechiae. No dark tarry, maroon or red stools.  No fever, chills, n/v, cough, rash, dizziness, chest pain, abdominal pain or changes in bowel or bladder habits. No swelling, tenderness, numbness or tingling in her extremities at this time.  No lymphadenopathy noted on exam.  She has a good appetite and is staying well hydrated. Her weight is stable.    ECOG Performance Status: 1 - Symptomatic but completely ambulatory  Medications:  Allergies as of 08/14/2018      Reactions   Penicillins Anaphylaxis   Ciprocinonide [fluocinolone] Other (See Comments)   unknown   Sulfa Antibiotics Rash      Medication List        Accurate as of 08/14/18  4:00 PM. Always use your most recent med list.          acetaminophen 500 MG tablet Commonly known as:  TYLENOL Take 1,000 mg by mouth every 6 (six) hours as needed for moderate pain.   bisacodyl 10 MG suppository Commonly known as:  DULCOLAX Place 1 suppository (10 mg total) rectally daily as needed for moderate constipation.   cloNIDine 0.2 MG tablet Commonly known as:  CATAPRES Take 1 tablet (0.2 mg total) by mouth 2 (two) times daily.   diazepam 5 MG tablet Commonly known as:  VALIUM Take 1 tablet (5 mg total) by mouth every 8 (eight) hours as needed for anxiety.   diphenhydrAMINE 25 MG tablet Commonly known as:  SOMINEX Take 25 mg by mouth every other day.   famotidine 20 MG  tablet Commonly known as:  PEPCID Take 1 tablet (20 mg total) by mouth 2 (two) times daily.   omeprazole 20 MG capsule Commonly known as:  PRILOSEC Take 20 mg by mouth daily.   polyethylene glycol packet Commonly known as:  MIRALAX / GLYCOLAX Take 17 g by mouth daily.   Vitamin D (Ergocalciferol) 50000 units Caps capsule Commonly known as:  DRISDOL Take 1 capsule (50,000 Units total) by mouth every 7 (seven) days.       Allergies:  Allergies  Allergen Reactions  . Penicillins Anaphylaxis  . Ciprocinonide [Fluocinolone] Other (See Comments)    unknown  . Sulfa Antibiotics Rash    Past Medical History, Surgical history, Social history, and Family History were reviewed and updated.  Review of Systems: All other 10 point review of systems is negative.   Physical Exam:  vitals were not taken for this visit.   Wt Readings from Last 3 Encounters:  07/11/18 173 lb 4 oz (78.6 kg)  06/05/18 180 lb 1.9 oz (81.7 kg)  03/13/18 181 lb (82.1 kg)    Ocular: Sclerae unicteric, pupils equal, round and reactive to light Ear-nose-throat: Oropharynx clear, dentition fair Lymphatic: No cervical, supraclavicular or axillary adenopathy Lungs no rales or rhonchi, good excursion bilaterally Heart regular rate and rhythm, no murmur appreciated Abd soft, nontender, positive bowel sounds, no liver or spleen tip palpated on  exam, no fluid wave  MSK no focal spinal tenderness, no joint edema Neuro: non-focal, well-oriented, appropriate affect Breasts: Deferred   Lab Results  Component Value Date   WBC 5.3 08/14/2018   HGB 10.7 (L) 08/14/2018   HCT 34.5 (L) 08/14/2018   MCV 93.0 08/14/2018   PLT 352 08/14/2018   Lab Results  Component Value Date   FERRITIN 85 06/06/2018   IRON 35 06/06/2018   TIBC 271 06/06/2018   UIBC 236 06/06/2018   IRONPCTSAT 13 06/06/2018   Lab Results  Component Value Date   RETICCTPCT 8.6 (H) 06/06/2018   RBC 3.71 08/14/2018   RETICCTABS 139.2  12/12/2015   No results found for: KPAFRELGTCHN, LAMBDASER, KAPLAMBRATIO No results found for: Kandis Cocking, [facility] Lab Results  Component Value Date   TOTALPROTELP 6.8 02/28/2012   ALBUMINELP 57.8 02/28/2012   A1GS 4.0 02/28/2012   A2GS 11.7 02/28/2012   BETS 6.7 02/28/2012   BETA2SER 5.2 02/28/2012   GAMS 14.6 02/28/2012   MSPIKE NOT DET 02/28/2012   SPEI * 02/28/2012     Chemistry      Component Value Date/Time   NA 141 06/05/2018 0033   NA 148 (H) 12/19/2017 1355   NA 143 06/28/2017 1114   K 3.4 (L) 06/05/2018 0033   K 4.1 12/19/2017 1355   K 4.0 06/28/2017 1114   CL 112 (H) 06/05/2018 0033   CL 108 12/19/2017 1355   CO2 22 06/05/2018 0033   CO2 27 12/19/2017 1355   CO2 23 06/28/2017 1114   BUN 18 06/05/2018 0033   BUN 12 12/19/2017 1355   BUN 11.0 06/28/2017 1114   CREATININE 0.80 06/05/2018 0033   CREATININE 0.83 05/01/2018 1408   CREATININE 0.9 12/19/2017 1355   CREATININE 0.7 06/28/2017 1114      Component Value Date/Time   CALCIUM 8.5 (L) 06/05/2018 0033   CALCIUM 9.5 12/19/2017 1355   CALCIUM 9.5 06/28/2017 1114   ALKPHOS 91 05/01/2018 1408   ALKPHOS 88 (H) 12/19/2017 1355   ALKPHOS 88 06/28/2017 1114   AST 16 05/01/2018 1408   AST 16 06/28/2017 1114   ALT 15 05/01/2018 1408   ALT 16 12/19/2017 1355   ALT 13 06/28/2017 1114   BILITOT 0.3 05/01/2018 1408   BILITOT 0.30 06/28/2017 1114      Impression and Plan: Jamie Mcdowell is a very pleasant 57 yo caucasian female with iron deficiency anemia. She is doing well and her symptoms have improved.  We will see what her iron studies show and bring her back in for infusion if needed.  We will plan to see her back in another 6 weeks for follow-up.  She will contact our office with any questions or concerns. We can certainly see her sooner if need be.   [name], NP 8/22/20194:00 PM

## 2018-08-15 ENCOUNTER — Ambulatory Visit: Payer: Self-pay | Admitting: Family

## 2018-08-15 ENCOUNTER — Inpatient Hospital Stay: Payer: Managed Care, Other (non HMO)

## 2018-08-15 ENCOUNTER — Other Ambulatory Visit: Payer: Self-pay

## 2018-08-15 VITALS — BP 117/78 | HR 70 | Temp 98.0°F | Resp 18

## 2018-08-15 DIAGNOSIS — D509 Iron deficiency anemia, unspecified: Secondary | ICD-10-CM | POA: Diagnosis not present

## 2018-08-15 DIAGNOSIS — D500 Iron deficiency anemia secondary to blood loss (chronic): Secondary | ICD-10-CM

## 2018-08-15 LAB — IRON AND TIBC
Iron: 38 ug/dL — ABNORMAL LOW (ref 41–142)
Saturation Ratios: 12 % — ABNORMAL LOW (ref 21–57)
TIBC: 325 ug/dL (ref 236–444)
UIBC: 287 ug/dL

## 2018-08-15 LAB — FERRITIN: Ferritin: 49 ng/mL (ref 11–307)

## 2018-08-15 LAB — RETICULOCYTES
RBC.: 3.7 MIL/uL (ref 3.70–5.45)
RETIC COUNT ABSOLUTE: 162.8 10*3/uL — AB (ref 33.7–90.7)
RETIC CT PCT: 4.4 % — AB (ref 0.7–2.1)

## 2018-08-15 LAB — VITAMIN D 25 HYDROXY (VIT D DEFICIENCY, FRACTURES): Vit D, 25-Hydroxy: 33.1 ng/mL (ref 30.0–100.0)

## 2018-08-15 MED ORDER — SODIUM CHLORIDE 0.9 % IV SOLN
510.0000 mg | Freq: Once | INTRAVENOUS | Status: AC
  Administered 2018-08-15: 510 mg via INTRAVENOUS
  Filled 2018-08-15: qty 17

## 2018-08-15 MED ORDER — SODIUM CHLORIDE 0.9 % IV SOLN
Freq: Once | INTRAVENOUS | Status: AC
  Administered 2018-08-15: 14:00:00 via INTRAVENOUS
  Filled 2018-08-15: qty 250

## 2018-08-15 NOTE — Patient Instructions (Signed)

## 2018-09-04 ENCOUNTER — Ambulatory Visit: Payer: Self-pay | Admitting: Family

## 2018-09-04 ENCOUNTER — Inpatient Hospital Stay: Payer: Managed Care, Other (non HMO)

## 2018-09-25 ENCOUNTER — Inpatient Hospital Stay (HOSPITAL_BASED_OUTPATIENT_CLINIC_OR_DEPARTMENT_OTHER): Payer: Managed Care, Other (non HMO) | Admitting: Family

## 2018-09-25 ENCOUNTER — Encounter: Payer: Self-pay | Admitting: Family

## 2018-09-25 ENCOUNTER — Other Ambulatory Visit: Payer: Self-pay

## 2018-09-25 ENCOUNTER — Inpatient Hospital Stay: Payer: Managed Care, Other (non HMO) | Attending: Hematology & Oncology

## 2018-09-25 VITALS — BP 152/93 | HR 73 | Temp 98.1°F | Resp 18 | Wt 170.0 lb

## 2018-09-25 DIAGNOSIS — E559 Vitamin D deficiency, unspecified: Secondary | ICD-10-CM

## 2018-09-25 DIAGNOSIS — D509 Iron deficiency anemia, unspecified: Secondary | ICD-10-CM

## 2018-09-25 DIAGNOSIS — D500 Iron deficiency anemia secondary to blood loss (chronic): Secondary | ICD-10-CM

## 2018-09-25 LAB — CBC WITH DIFFERENTIAL (CANCER CENTER ONLY)
Basophils Absolute: 0 10*3/uL (ref 0.0–0.1)
Basophils Relative: 1 %
EOS ABS: 0.1 10*3/uL (ref 0.0–0.5)
EOS PCT: 1 %
HCT: 39.8 % (ref 34.8–46.6)
Hemoglobin: 12.5 g/dL (ref 11.6–15.9)
LYMPHS ABS: 1.3 10*3/uL (ref 0.9–3.3)
LYMPHS PCT: 25 %
MCH: 27.8 pg (ref 26.0–34.0)
MCHC: 31.4 g/dL — AB (ref 32.0–36.0)
MCV: 88.6 fL (ref 81.0–101.0)
MONO ABS: 0.4 10*3/uL (ref 0.1–0.9)
MONOS PCT: 7 %
Neutro Abs: 3.4 10*3/uL (ref 1.5–6.5)
Neutrophils Relative %: 66 %
PLATELETS: 327 10*3/uL (ref 145–400)
RBC: 4.49 MIL/uL (ref 3.70–5.32)
RDW: 14.6 % (ref 11.1–15.7)
WBC: 5.2 10*3/uL (ref 3.9–10.0)

## 2018-09-25 LAB — CMP (CANCER CENTER ONLY)
ALT: 15 U/L (ref 10–47)
ANION GAP: 6 (ref 5–15)
AST: 19 U/L (ref 11–38)
Albumin: 4.3 g/dL (ref 3.5–5.0)
Alkaline Phosphatase: 103 U/L — ABNORMAL HIGH (ref 26–84)
BUN: 17 mg/dL (ref 7–22)
CALCIUM: 9.7 mg/dL (ref 8.0–10.3)
CO2: 25 mmol/L (ref 18–33)
CREATININE: 0.7 mg/dL (ref 0.60–1.20)
Chloride: 113 mmol/L — ABNORMAL HIGH (ref 98–108)
GLUCOSE: 91 mg/dL (ref 73–118)
POTASSIUM: 3.9 mmol/L (ref 3.3–4.7)
SODIUM: 144 mmol/L (ref 128–145)
TOTAL PROTEIN: 7.3 g/dL (ref 6.4–8.1)
Total Bilirubin: 0.4 mg/dL (ref 0.2–1.6)

## 2018-09-25 NOTE — Progress Notes (Signed)
Hematology and Oncology Follow Up Visit  VAUDIE ENGEBRETSEN [id number] Nov 07, 1961 57 y.o. 09/25/2018   Principle Diagnosis:  Recurrent iron deficiency anemia  Current Therapy:   IV iron as indicated - last receivedin August2019   Interim History:  Ms. Men is here today for follow-up. She is symptomatic with fatigue, SOB with stairs, palpitations, and tingling in her hands that comes and goes.  She has not noted any episodes of bleeding, no bruising or petechiae.  No fever, chills, n/v, cough, rash, dizziness, chest pain, abdominal pain or change sin bowel or bladder habits.  No swelling or tenderness in her extremities at this time.  No lymphadenopathy noted on exam.  She has maintained a good appetite and is staying well hydrated. Her weight is stable.  She is staying busy with work and her sweet family.   ECOG Performance Status: 1 - Symptomatic but completely ambulatory  Medications:  Allergies as of 09/25/2018      Reactions   Penicillins Anaphylaxis   Ciprocinonide [fluocinolone] Other (See Comments)   unknown   Sulfa Antibiotics Rash      Medication List        Accurate as of 09/25/18  9:56 PM. Always use your most recent med list.          acetaminophen 500 MG tablet Commonly known as:  TYLENOL Take 1,000 mg by mouth every 6 (six) hours as needed for moderate pain.   calcium carbonate 500 MG chewable tablet Commonly known as:  TUMS - dosed in mg elemental calcium Chew 1 tablet by mouth 2 (two) times daily.   cloNIDine 0.2 MG tablet Commonly known as:  CATAPRES Take 1 tablet (0.2 mg total) by mouth 2 (two) times daily.   Vitamin D (Ergocalciferol) 50000 units Caps capsule Commonly known as:  DRISDOL Take 1 capsule (50,000 Units total) by mouth every 7 (seven) days.       Allergies:  Allergies  Allergen Reactions  . Penicillins Anaphylaxis  . Ciprocinonide [Fluocinolone] Other (See Comments)    unknown  . Sulfa Antibiotics Rash    Past Medical  History, Surgical history, Social history, and Family History were reviewed and updated.  Review of Systems: All other 10 point review of systems is negative.   Physical Exam:  weight is 170 lb (77.1 kg). Her oral temperature is 98.1 F (36.7 C). Her blood pressure is 152/93 (abnormal) and her pulse is 73. Her respiration is 18 and oxygen saturation is 100%.   Wt Readings from Last 3 Encounters:  09/25/18 170 lb (77.1 kg)  08/14/18 173 lb (78.5 kg)  07/11/18 173 lb 4 oz (78.6 kg)    Ocular: Sclerae unicteric, pupils equal, round and reactive to light Ear-nose-throat: Oropharynx clear, dentition fair Lymphatic: No cervical, supraclavicular or axillary adenopathy Lungs no rales or rhonchi, good excursion bilaterally Heart regular rate and rhythm, no murmur appreciated Abd soft, nontender, positive bowel sounds, no liver or spleen tip palpated on exam, no fluid wave  MSK no focal spinal tenderness, no joint edema Neuro: non-focal, well-oriented, appropriate affect Breasts: Deferred   Lab Results  Component Value Date   WBC 5.2 09/25/2018   HGB 12.5 09/25/2018   HCT 39.8 09/25/2018   MCV 88.6 09/25/2018   PLT 327 09/25/2018   Lab Results  Component Value Date   FERRITIN 49 08/14/2018   IRON 38 (L) 08/14/2018   TIBC 325 08/14/2018   UIBC 287 08/14/2018   IRONPCTSAT 12 (L) 08/14/2018   Lab Results  Component Value Date   RETICCTPCT 4.4 (H) 08/14/2018   RBC 4.49 09/25/2018   RETICCTABS 139.2 12/12/2015   No results found for: KPAFRELGTCHN, LAMBDASER, KAPLAMBRATIO No results found for: Kandis Cocking, [facility] Lab Results  Component Value Date   TOTALPROTELP 6.8 02/28/2012   ALBUMINELP 57.8 02/28/2012   A1GS 4.0 02/28/2012   A2GS 11.7 02/28/2012   BETS 6.7 02/28/2012   BETA2SER 5.2 02/28/2012   GAMS 14.6 02/28/2012   MSPIKE NOT DET 02/28/2012   SPEI * 02/28/2012     Chemistry      Component Value Date/Time   NA 144 09/25/2018 1514   NA 148 (H) 12/19/2017 1355     NA 143 06/28/2017 1114   K 3.9 09/25/2018 1514   K 4.1 12/19/2017 1355   K 4.0 06/28/2017 1114   CL 113 (H) 09/25/2018 1514   CL 108 12/19/2017 1355   CO2 25 09/25/2018 1514   CO2 27 12/19/2017 1355   CO2 23 06/28/2017 1114   BUN 17 09/25/2018 1514   BUN 12 12/19/2017 1355   BUN 11.0 06/28/2017 1114   CREATININE 0.70 09/25/2018 1514   CREATININE 0.9 12/19/2017 1355   CREATININE 0.7 06/28/2017 1114      Component Value Date/Time   CALCIUM 9.7 09/25/2018 1514   CALCIUM 9.5 12/19/2017 1355   CALCIUM 9.5 06/28/2017 1114   ALKPHOS 103 (H) 09/25/2018 1514   ALKPHOS 88 (H) 12/19/2017 1355   ALKPHOS 88 06/28/2017 1114   AST 19 09/25/2018 1514   AST 16 06/28/2017 1114   ALT 15 09/25/2018 1514   ALT 16 12/19/2017 1355   ALT 13 06/28/2017 1114   BILITOT 0.4 09/25/2018 1514   BILITOT 0.30 06/28/2017 1114      Impression and Plan: Ms. Slaby is a very pleasant 57 yo caucasian female with iron deficiency anemia. She is doing well but still having some fatigue, SOB, palpitations and numbness and tingling in her fingertips.  We will see what her iron studies show and bring her back in for infusion if needed.  We will go ahead and plan to see her back in another 6 weeks for follow-up.  She will contact our office with any questions or concerns. We can certainly see her sooner if need be.   [name], NP 10/3/20199:56 PM

## 2018-09-26 LAB — IRON AND TIBC
IRON: 29 ug/dL — AB (ref 41–142)
Saturation Ratios: 8 % — ABNORMAL LOW (ref 21–57)
TIBC: 360 ug/dL (ref 236–444)
UIBC: 331 ug/dL

## 2018-09-26 LAB — RETICULOCYTES
RBC.: 4.42 MIL/uL (ref 3.70–5.45)
RETIC COUNT ABSOLUTE: 97.2 10*3/uL — AB (ref 33.7–90.7)
RETIC CT PCT: 2.2 % — AB (ref 0.7–2.1)

## 2018-09-26 LAB — VITAMIN D 25 HYDROXY (VIT D DEFICIENCY, FRACTURES): VIT D 25 HYDROXY: 37.5 ng/mL (ref 30.0–100.0)

## 2018-09-26 LAB — FERRITIN: FERRITIN: 24 ng/mL (ref 11–307)

## 2018-10-03 ENCOUNTER — Inpatient Hospital Stay: Payer: Managed Care, Other (non HMO)

## 2018-10-03 VITALS — BP 129/52 | HR 65 | Temp 98.1°F | Resp 18

## 2018-10-03 DIAGNOSIS — D500 Iron deficiency anemia secondary to blood loss (chronic): Secondary | ICD-10-CM

## 2018-10-03 DIAGNOSIS — D509 Iron deficiency anemia, unspecified: Secondary | ICD-10-CM | POA: Diagnosis not present

## 2018-10-03 MED ORDER — SODIUM CHLORIDE 0.9 % IV SOLN
510.0000 mg | Freq: Once | INTRAVENOUS | Status: AC
  Administered 2018-10-03: 510 mg via INTRAVENOUS
  Filled 2018-10-03: qty 17

## 2018-10-03 MED ORDER — SODIUM CHLORIDE 0.9 % IV SOLN
INTRAVENOUS | Status: DC
  Administered 2018-10-03: 15:00:00 via INTRAVENOUS
  Filled 2018-10-03 (×2): qty 250

## 2018-10-10 ENCOUNTER — Inpatient Hospital Stay: Payer: Managed Care, Other (non HMO)

## 2018-10-10 VITALS — BP 140/82 | HR 64 | Temp 98.3°F | Resp 18

## 2018-10-10 DIAGNOSIS — D509 Iron deficiency anemia, unspecified: Secondary | ICD-10-CM | POA: Diagnosis not present

## 2018-10-10 DIAGNOSIS — D500 Iron deficiency anemia secondary to blood loss (chronic): Secondary | ICD-10-CM

## 2018-10-10 MED ORDER — HEPARIN SOD (PORK) LOCK FLUSH 100 UNIT/ML IV SOLN
500.0000 [IU] | Freq: Once | INTRAVENOUS | Status: DC | PRN
  Filled 2018-10-10: qty 5

## 2018-10-10 MED ORDER — SODIUM CHLORIDE 0.9 % IV SOLN
INTRAVENOUS | Status: DC
  Administered 2018-10-10: 12:00:00 via INTRAVENOUS
  Filled 2018-10-10: qty 250

## 2018-10-10 MED ORDER — SODIUM CHLORIDE 0.9 % IV SOLN
510.0000 mg | Freq: Once | INTRAVENOUS | Status: AC
  Administered 2018-10-10: 510 mg via INTRAVENOUS
  Filled 2018-10-10: qty 17

## 2018-10-10 MED ORDER — SODIUM CHLORIDE 0.9 % IJ SOLN
10.0000 mL | INTRAMUSCULAR | Status: DC | PRN
  Filled 2018-10-10: qty 10

## 2018-11-06 ENCOUNTER — Other Ambulatory Visit: Payer: Self-pay

## 2018-11-06 ENCOUNTER — Encounter: Payer: Self-pay | Admitting: Family

## 2018-11-06 ENCOUNTER — Inpatient Hospital Stay: Payer: Managed Care, Other (non HMO)

## 2018-11-06 ENCOUNTER — Inpatient Hospital Stay: Payer: Managed Care, Other (non HMO) | Attending: Hematology & Oncology | Admitting: Family

## 2018-11-06 VITALS — BP 160/84 | HR 63 | Temp 97.8°F | Resp 18 | Wt 172.0 lb

## 2018-11-06 DIAGNOSIS — D500 Iron deficiency anemia secondary to blood loss (chronic): Secondary | ICD-10-CM

## 2018-11-06 DIAGNOSIS — D509 Iron deficiency anemia, unspecified: Secondary | ICD-10-CM | POA: Diagnosis not present

## 2018-11-06 DIAGNOSIS — E559 Vitamin D deficiency, unspecified: Secondary | ICD-10-CM

## 2018-11-06 LAB — RETICULOCYTES
IMMATURE RETIC FRACT: 18 % — AB (ref 2.3–15.9)
RBC.: 4.14 MIL/uL (ref 3.87–5.11)
Retic Count, Absolute: 161.9 10*3/uL (ref 19.0–186.0)
Retic Ct Pct: 3.9 % — ABNORMAL HIGH (ref 0.4–3.1)

## 2018-11-06 LAB — CBC WITH DIFFERENTIAL (CANCER CENTER ONLY)
Abs Immature Granulocytes: 0.01 10*3/uL (ref 0.00–0.07)
Basophils Absolute: 0 10*3/uL (ref 0.0–0.1)
Basophils Relative: 1 %
EOS PCT: 2 %
Eosinophils Absolute: 0.1 10*3/uL (ref 0.0–0.5)
HEMATOCRIT: 38 % (ref 36.0–46.0)
Hemoglobin: 11.8 g/dL — ABNORMAL LOW (ref 12.0–15.0)
Immature Granulocytes: 0 %
LYMPHS PCT: 32 %
Lymphs Abs: 1.3 10*3/uL (ref 0.7–4.0)
MCH: 28.5 pg (ref 26.0–34.0)
MCHC: 31.1 g/dL (ref 30.0–36.0)
MCV: 91.8 fL (ref 80.0–100.0)
MONO ABS: 0.3 10*3/uL (ref 0.1–1.0)
MONOS PCT: 8 %
NEUTROS ABS: 2.3 10*3/uL (ref 1.7–7.7)
Neutrophils Relative %: 57 %
PLATELETS: 324 10*3/uL (ref 150–400)
RBC: 4.14 MIL/uL (ref 3.87–5.11)
RDW: 16.5 % — ABNORMAL HIGH (ref 11.5–15.5)
WBC: 4 10*3/uL (ref 4.0–10.5)
nRBC: 0 % (ref 0.0–0.2)

## 2018-11-06 NOTE — Progress Notes (Signed)
Hematology and Oncology Follow Up Visit  Jamie Mcdowell [id number] [birth year] 57 y.o. 11/06/2018   Principle Diagnosis:  Recurrent iron deficiency anemia  Current Therapy:   IV iron as indicated - last receivedin August2019   Interim History: Jamie Mcdowell is here today for follow-up. She is symptomatic with fatigue, weakness (feels like her arms and legs are "heavy"), palpitations, SOB with exertion, headaches, chills, cheilitis and tingling in her hands and feet.  She has not noted any episodes of bleeding.  No fever, n/v, cough, rash, dizziness, chest pain, abdominal pain or changes in bowel or bladder habits.  No swelling or tenderness in her extremities at this time.  No lymphadenopathy noted on exam.  She has maintained a good appetite and is staying well hydrated. Her weight is stable.   ECOG Performance Status: 1 - Symptomatic but completely ambulatory  Medications:  Allergies as of 11/06/2018      Reactions   Penicillins Anaphylaxis   Ciprocinonide [fluocinolone] Other (See Comments)   unknown   Sulfa Antibiotics Rash      Medication List        Accurate as of 11/06/18  3:56 PM. Always use your most recent med list.          acetaminophen 500 MG tablet Commonly known as:  TYLENOL Take 1,000 mg by mouth every 6 (six) hours as needed for moderate pain.   calcium carbonate 500 MG chewable tablet Commonly known as:  TUMS - dosed in mg elemental calcium Chew 1 tablet by mouth 2 (two) times daily.   cloNIDine 0.2 MG tablet Commonly known as:  CATAPRES Take 1 tablet (0.2 mg total) by mouth 2 (two) times daily.   Vitamin D (Ergocalciferol) 1.25 MG (50000 UT) Caps capsule Commonly known as:  DRISDOL Take 1 capsule (50,000 Units total) by mouth every 7 (seven) days.       Allergies:  Allergies  Allergen Reactions  . Penicillins Anaphylaxis  . Ciprocinonide [Fluocinolone] Other (See Comments)    unknown  . Sulfa Antibiotics Rash    Past Medical History,  Surgical history, Social history, and Family History were reviewed and updated.  Review of Systems: All other 10 point review of systems is negative.   Physical Exam:  weight is 172 lb (78 kg). Her oral temperature is 97.8 F (36.6 C). Her blood pressure is 160/84 (abnormal) and her pulse is 63. Her respiration is 18 and oxygen saturation is 100%.   Wt Readings from Last 3 Encounters:  11/06/18 172 lb (78 kg)  09/25/18 170 lb (77.1 kg)  08/14/18 173 lb (78.5 kg)    Ocular: Sclerae unicteric, pupils equal, round and reactive to light Ear-nose-throat: Oropharynx clear, dentition fair Lymphatic: No cervical, supraclavicular or axillary adenopathy Lungs no rales or rhonchi, good excursion bilaterally Heart regular rate and rhythm, no murmur appreciated Abd soft, nontender, positive bowel sounds, no liver or spleen tip palpated on exam, no fluid wave  MSK no focal spinal tenderness, no joint edema Neuro: non-focal, well-oriented, appropriate affect Breasts: Deferred   Lab Results  Component Value Date   WBC 4.0 11/06/2018   HGB 11.8 (L) 11/06/2018   HCT 38.0 11/06/2018   MCV 91.8 11/06/2018   PLT 324 11/06/2018   Lab Results  Component Value Date   FERRITIN 24 09/25/2018   IRON 29 (L) 09/25/2018   TIBC 360 09/25/2018   UIBC 331 09/25/2018   IRONPCTSAT 8 (L) 09/25/2018   Lab Results  Component Value Date   RETICCTPCT  3.9 (H) 11/06/2018   RBC 4.14 11/06/2018   RBC 4.14 11/06/2018   RETICCTABS 139.2 12/12/2015   No results found for: KPAFRELGTCHN, LAMBDASER, KAPLAMBRATIO No results found for: Kandis Cocking, [facility] Lab Results  Component Value Date   TOTALPROTELP 6.8 02/28/2012   ALBUMINELP 57.8 02/28/2012   A1GS 4.0 02/28/2012   A2GS 11.7 02/28/2012   BETS 6.7 02/28/2012   BETA2SER 5.2 02/28/2012   GAMS 14.6 02/28/2012   MSPIKE NOT DET 02/28/2012   SPEI * 02/28/2012     Chemistry      Component Value Date/Time   NA 144 09/25/2018 1514   NA 148 (H)  12/19/2017 1355   NA 143 06/28/2017 1114   K 3.9 09/25/2018 1514   K 4.1 12/19/2017 1355   K 4.0 06/28/2017 1114   CL 113 (H) 09/25/2018 1514   CL 108 12/19/2017 1355   CO2 25 09/25/2018 1514   CO2 27 12/19/2017 1355   CO2 23 06/28/2017 1114   BUN 17 09/25/2018 1514   BUN 12 12/19/2017 1355   BUN 11.0 06/28/2017 1114   CREATININE 0.70 09/25/2018 1514   CREATININE 0.9 12/19/2017 1355   CREATININE 0.7 06/28/2017 1114      Component Value Date/Time   CALCIUM 9.7 09/25/2018 1514   CALCIUM 9.5 12/19/2017 1355   CALCIUM 9.5 06/28/2017 1114   ALKPHOS 103 (H) 09/25/2018 1514   ALKPHOS 88 (H) 12/19/2017 1355   ALKPHOS 88 06/28/2017 1114   AST 19 09/25/2018 1514   AST 16 06/28/2017 1114   ALT 15 09/25/2018 1514   ALT 16 12/19/2017 1355   ALT 13 06/28/2017 1114   BILITOT 0.4 09/25/2018 1514   BILITOT 0.30 06/28/2017 1114       Impression and Plan: Jamie Mcdowell is a very pleasant 57 yo caucasian female with iron deficiency anemia. She is symptomatic as mentioned above.  Hgb is down slightly at 11.8, MCV 91.8.  We will see what her iron studies show and get her set up for infusion if needed.  We will go ahead and plan to see her back in another 6 weeks for follow-up.  She will contact our office with any questions or concerns. We can certainly see her sooner if need be.   [name], NP 11/14/20193:56 PM

## 2018-11-07 ENCOUNTER — Telehealth: Payer: Self-pay | Admitting: Family

## 2018-11-07 LAB — IRON AND TIBC
Iron: 51 ug/dL (ref 41–142)
Saturation Ratios: 18 % — ABNORMAL LOW (ref 21–57)
TIBC: 288 ug/dL (ref 236–444)
UIBC: 236 ug/dL (ref 120–384)

## 2018-11-07 LAB — CMP (CANCER CENTER ONLY)
ALBUMIN: 3.9 g/dL (ref 3.5–5.0)
ALK PHOS: 90 U/L (ref 38–126)
ALT: 12 U/L (ref 0–44)
AST: 17 U/L (ref 15–41)
Anion gap: 10 (ref 5–15)
BUN: 15 mg/dL (ref 6–20)
CALCIUM: 9.8 mg/dL (ref 8.9–10.3)
CO2: 24 mmol/L (ref 22–32)
Chloride: 109 mmol/L (ref 98–111)
Creatinine: 0.73 mg/dL (ref 0.44–1.00)
GFR, Est AFR Am: >60 mL/min (ref >60)
GLUCOSE: 71 mg/dL (ref 70–99)
Potassium: 4 mmol/L (ref 3.5–5.1)
Sodium: 143 mmol/L (ref 135–145)
TOTAL PROTEIN: 6.9 g/dL (ref 6.5–8.1)
Total Bilirubin: <0.2 mg/dL — ABNORMAL LOW (ref 0.3–1.2)

## 2018-11-07 LAB — FERRITIN: FERRITIN: 106 ng/mL (ref 11–307)

## 2018-11-07 LAB — VITAMIN D 25 HYDROXY (VIT D DEFICIENCY, FRACTURES): Vit D, 25-Hydroxy: 29 ng/mL — ABNORMAL LOW (ref 30.0–100.0)

## 2018-11-07 NOTE — Telephone Encounter (Signed)
Appointments scheduled, patient notified date/time letter/calendar mailed per 11/14 los

## 2018-11-10 ENCOUNTER — Other Ambulatory Visit: Payer: Self-pay

## 2018-11-10 ENCOUNTER — Inpatient Hospital Stay: Payer: Managed Care, Other (non HMO)

## 2018-11-10 VITALS — BP 156/85 | HR 55 | Temp 98.3°F | Resp 17

## 2018-11-10 DIAGNOSIS — D500 Iron deficiency anemia secondary to blood loss (chronic): Secondary | ICD-10-CM

## 2018-11-10 DIAGNOSIS — D509 Iron deficiency anemia, unspecified: Secondary | ICD-10-CM | POA: Diagnosis not present

## 2018-11-10 MED ORDER — ACETAMINOPHEN 500 MG PO TABS
1000.0000 mg | ORAL_TABLET | Freq: Once | ORAL | Status: AC
  Administered 2018-11-10: 1000 mg via ORAL

## 2018-11-10 MED ORDER — SODIUM CHLORIDE 0.9 % IV SOLN
Freq: Once | INTRAVENOUS | Status: AC
  Administered 2018-11-10: 15:00:00 via INTRAVENOUS
  Filled 2018-11-10: qty 250

## 2018-11-10 MED ORDER — ACETAMINOPHEN 500 MG PO TABS
ORAL_TABLET | ORAL | Status: AC
  Filled 2018-11-10: qty 2

## 2018-11-10 MED ORDER — SODIUM CHLORIDE 0.9 % IV SOLN
510.0000 mg | Freq: Once | INTRAVENOUS | Status: AC
  Administered 2018-11-10: 510 mg via INTRAVENOUS
  Filled 2018-11-10: qty 17

## 2018-11-10 NOTE — Patient Instructions (Signed)

## 2018-12-03 ENCOUNTER — Other Ambulatory Visit: Payer: Self-pay | Admitting: *Deleted

## 2018-12-03 DIAGNOSIS — D500 Iron deficiency anemia secondary to blood loss (chronic): Secondary | ICD-10-CM

## 2018-12-04 ENCOUNTER — Inpatient Hospital Stay: Payer: Managed Care, Other (non HMO) | Attending: Hematology & Oncology

## 2018-12-04 DIAGNOSIS — D509 Iron deficiency anemia, unspecified: Secondary | ICD-10-CM | POA: Insufficient documentation

## 2018-12-04 DIAGNOSIS — D500 Iron deficiency anemia secondary to blood loss (chronic): Secondary | ICD-10-CM

## 2018-12-04 LAB — CBC WITH DIFFERENTIAL (CANCER CENTER ONLY)
Abs Immature Granulocytes: 0 10*3/uL (ref 0.00–0.07)
BASOS PCT: 1 %
Basophils Absolute: 0 10*3/uL (ref 0.0–0.1)
Eosinophils Absolute: 0.1 10*3/uL (ref 0.0–0.5)
Eosinophils Relative: 3 %
HEMATOCRIT: 41.5 % (ref 36.0–46.0)
Hemoglobin: 12.8 g/dL (ref 12.0–15.0)
Immature Granulocytes: 0 %
Lymphocytes Relative: 32 %
Lymphs Abs: 1.3 10*3/uL (ref 0.7–4.0)
MCH: 29 pg (ref 26.0–34.0)
MCHC: 30.8 g/dL (ref 30.0–36.0)
MCV: 94.1 fL (ref 80.0–100.0)
Monocytes Absolute: 0.4 10*3/uL (ref 0.1–1.0)
Monocytes Relative: 9 %
NEUTROS ABS: 2.3 10*3/uL (ref 1.7–7.7)
Neutrophils Relative %: 55 %
PLATELETS: 286 10*3/uL (ref 150–400)
RBC: 4.41 MIL/uL (ref 3.87–5.11)
RDW: 15.1 % (ref 11.5–15.5)
WBC Count: 4.1 10*3/uL (ref 4.0–10.5)
nRBC: 0 % (ref 0.0–0.2)

## 2018-12-04 LAB — COMPREHENSIVE METABOLIC PANEL
ALT: 11 U/L (ref 0–44)
AST: 14 U/L — ABNORMAL LOW (ref 15–41)
Albumin: 4.4 g/dL (ref 3.5–5.0)
Alkaline Phosphatase: 83 U/L (ref 38–126)
Anion gap: 8 (ref 5–15)
BUN: 13 mg/dL (ref 6–20)
CO2: 30 mmol/L (ref 22–32)
CREATININE: 0.7 mg/dL (ref 0.44–1.00)
Calcium: 9.9 mg/dL (ref 8.9–10.3)
Chloride: 107 mmol/L (ref 98–111)
GFR calc Af Amer: >60 mL/min (ref >60)
GFR calc non Af Amer: >60 mL/min (ref >60)
Glucose, Bld: 104 mg/dL — ABNORMAL HIGH (ref 70–99)
Potassium: 4 mmol/L (ref 3.5–5.1)
Sodium: 145 mmol/L (ref 135–145)
Total Bilirubin: 0.3 mg/dL (ref 0.3–1.2)
Total Protein: 6.7 g/dL (ref 6.5–8.1)

## 2018-12-05 LAB — FERRITIN: Ferritin: 144 ng/mL (ref 11–307)

## 2018-12-05 LAB — IRON AND TIBC
Iron: 39 ug/dL — ABNORMAL LOW (ref 41–142)
Saturation Ratios: 15 % — ABNORMAL LOW (ref 21–57)
TIBC: 263 ug/dL (ref 236–444)
UIBC: 224 ug/dL (ref 120–384)

## 2018-12-08 ENCOUNTER — Other Ambulatory Visit: Payer: Self-pay | Admitting: Family

## 2018-12-09 ENCOUNTER — Inpatient Hospital Stay: Payer: Managed Care, Other (non HMO)

## 2018-12-09 VITALS — BP 154/90 | HR 71 | Temp 98.4°F | Resp 20

## 2018-12-09 DIAGNOSIS — D500 Iron deficiency anemia secondary to blood loss (chronic): Secondary | ICD-10-CM

## 2018-12-09 DIAGNOSIS — D509 Iron deficiency anemia, unspecified: Secondary | ICD-10-CM | POA: Diagnosis not present

## 2018-12-09 MED ORDER — SODIUM CHLORIDE 0.9 % IV SOLN
510.0000 mg | Freq: Once | INTRAVENOUS | Status: AC
  Administered 2018-12-09: 510 mg via INTRAVENOUS
  Filled 2018-12-09: qty 17

## 2018-12-09 MED ORDER — SODIUM CHLORIDE 0.9 % IV SOLN
INTRAVENOUS | Status: DC
  Administered 2018-12-09: 14:00:00 via INTRAVENOUS
  Filled 2018-12-09: qty 250

## 2018-12-09 NOTE — Patient Instructions (Signed)

## 2018-12-19 ENCOUNTER — Other Ambulatory Visit: Payer: Self-pay

## 2018-12-19 ENCOUNTER — Ambulatory Visit: Payer: Self-pay | Admitting: Family

## 2019-01-08 ENCOUNTER — Inpatient Hospital Stay: Payer: Managed Care, Other (non HMO) | Attending: Hematology & Oncology

## 2019-01-08 ENCOUNTER — Encounter: Payer: Self-pay | Admitting: Family

## 2019-01-08 ENCOUNTER — Inpatient Hospital Stay (HOSPITAL_BASED_OUTPATIENT_CLINIC_OR_DEPARTMENT_OTHER): Payer: Managed Care, Other (non HMO) | Admitting: Family

## 2019-01-08 VITALS — BP 150/97 | HR 74 | Temp 98.3°F | Resp 18 | Ht 64.0 in | Wt 172.8 lb

## 2019-01-08 DIAGNOSIS — D649 Anemia, unspecified: Secondary | ICD-10-CM

## 2019-01-08 DIAGNOSIS — D509 Iron deficiency anemia, unspecified: Secondary | ICD-10-CM

## 2019-01-08 DIAGNOSIS — E559 Vitamin D deficiency, unspecified: Secondary | ICD-10-CM

## 2019-01-08 DIAGNOSIS — D500 Iron deficiency anemia secondary to blood loss (chronic): Secondary | ICD-10-CM

## 2019-01-08 LAB — CBC WITH DIFFERENTIAL (CANCER CENTER ONLY)
Abs Immature Granulocytes: 0.01 10*3/uL (ref 0.00–0.07)
Basophils Absolute: 0 10*3/uL (ref 0.0–0.1)
Basophils Relative: 1 %
Eosinophils Absolute: 0.1 10*3/uL (ref 0.0–0.5)
Eosinophils Relative: 1 %
HCT: 32.1 % — ABNORMAL LOW (ref 36.0–46.0)
Hemoglobin: 10.2 g/dL — ABNORMAL LOW (ref 12.0–15.0)
Immature Granulocytes: 0 %
LYMPHS PCT: 33 %
Lymphs Abs: 1.4 10*3/uL (ref 0.7–4.0)
MCH: 30.4 pg (ref 26.0–34.0)
MCHC: 31.8 g/dL (ref 30.0–36.0)
MCV: 95.5 fL (ref 80.0–100.0)
Monocytes Absolute: 0.3 10*3/uL (ref 0.1–1.0)
Monocytes Relative: 6 %
Neutro Abs: 2.6 10*3/uL (ref 1.7–7.7)
Neutrophils Relative %: 59 %
PLATELETS: 332 10*3/uL (ref 150–400)
RBC: 3.36 MIL/uL — ABNORMAL LOW (ref 3.87–5.11)
RDW: 15.1 % (ref 11.5–15.5)
WBC Count: 4.4 10*3/uL (ref 4.0–10.5)
nRBC: 0 % (ref 0.0–0.2)

## 2019-01-08 LAB — CMP (CANCER CENTER ONLY)
ALT: 11 U/L (ref 0–44)
AST: 14 U/L — ABNORMAL LOW (ref 15–41)
Albumin: 4.2 g/dL (ref 3.5–5.0)
Alkaline Phosphatase: 76 U/L (ref 38–126)
Anion gap: 9 (ref 5–15)
BUN: 16 mg/dL (ref 6–20)
CHLORIDE: 109 mmol/L (ref 98–111)
CO2: 25 mmol/L (ref 22–32)
Calcium: 9.2 mg/dL (ref 8.9–10.3)
Creatinine: 0.68 mg/dL (ref 0.44–1.00)
GFR, Est AFR Am: >60 mL/min (ref >60)
GFR, Estimated: >60 mL/min (ref >60)
GLUCOSE: 93 mg/dL (ref 70–99)
Potassium: 4.1 mmol/L (ref 3.5–5.1)
Sodium: 143 mmol/L (ref 135–145)
Total Bilirubin: 0.3 mg/dL (ref 0.3–1.2)
Total Protein: 6.5 g/dL (ref 6.5–8.1)

## 2019-01-08 LAB — RETICULOCYTES
Immature Retic Fract: 31.5 % — ABNORMAL HIGH (ref 2.3–15.9)
RBC.: 3.36 MIL/uL — ABNORMAL LOW (ref 3.87–5.11)
Retic Count, Absolute: 269.5 10*3/uL — ABNORMAL HIGH (ref 19.0–186.0)
Retic Ct Pct: 8 % — ABNORMAL HIGH (ref 0.4–3.1)

## 2019-01-08 NOTE — Progress Notes (Signed)
Hematology and Oncology Follow Up Visit  Jamie Mcdowell [id number] Dec 08, 1961 58 y.o. 01/08/2019   Principle Diagnosis:  Recurrent iron deficiency anemia  Current Therapy:   IV iron as indicated   Interim History:  Jamie Mcdowell is here today for follow-up. She is symptomatic with fatigue, SOB with exertion, lightheadedness and feeling "heavy". She has noticed that her stool is dark and tarry at times.  Hgb today is 10.2 with an MCV of 95.  No obvious bleeding noted. No bruising or petechiae.  No fever, chills, n/v, cough, rash, chest pain, palpitations, abdominal pain or changes in bladder habits.  No swelling, tenderness, numbness or tingling in her extremities.  No lymphadenopathy noted on exam.  She has maintained a good appetite and is staying well hydrated. Her weight is stable.   ECOG Performance Status: 1 - Symptomatic but completely ambulatory  Medications:  Allergies as of 01/08/2019      Reactions   Penicillins Anaphylaxis   Ciprocinonide [fluocinolone] Other (See Comments)   unknown   Sulfa Antibiotics Rash      Medication List       Accurate as of January 08, 2019  2:25 PM. Always use your most recent med list.        acetaminophen 500 MG tablet Commonly known as:  TYLENOL Take 1,000 mg by mouth every 6 (six) hours as needed for moderate pain.   calcium carbonate 500 MG chewable tablet Commonly known as:  TUMS - dosed in mg elemental calcium Chew 1 tablet by mouth 2 (two) times daily.   cloNIDine 0.2 MG tablet Commonly known as:  CATAPRES Take 1 tablet (0.2 mg total) by mouth 2 (two) times daily.   Vitamin D (Ergocalciferol) 1.25 MG (50000 UT) Caps capsule Commonly known as:  DRISDOL Take 1 capsule (50,000 Units total) by mouth every 7 (seven) days.       Allergies:  Allergies  Allergen Reactions  . Penicillins Anaphylaxis  . Ciprocinonide [Fluocinolone] Other (See Comments)    unknown  . Sulfa Antibiotics Rash    Past Medical History,  Surgical history, Social history, and Family History were reviewed and updated.  Review of Systems: All other 10 point review of systems is negative.   Physical Exam:  vitals were not taken for this visit.   Wt Readings from Last 3 Encounters:  11/06/18 172 lb (78 kg)  09/25/18 170 lb (77.1 kg)  08/14/18 173 lb (78.5 kg)    Ocular: Sclerae unicteric, pupils equal, round and reactive to light Ear-nose-throat: Oropharynx clear, dentition fair Lymphatic: No cervical, supraclavicular or axillary adenopathy Lungs no rales or rhonchi, good excursion bilaterally Heart regular rate and rhythm, no murmur appreciated Abd soft, nontender, positive bowel sounds, no liver or spleen tip palpated on exam, no fluid wave  MSK no focal spinal tenderness, no joint edema Neuro: non-focal, well-oriented, appropriate affect Breasts: Deferred   Lab Results  Component Value Date   WBC 4.1 12/04/2018   HGB 12.8 12/04/2018   HCT 41.5 12/04/2018   MCV 94.1 12/04/2018   PLT 286 12/04/2018   Lab Results  Component Value Date   FERRITIN 144 12/04/2018   IRON 39 (L) 12/04/2018   TIBC 263 12/04/2018   UIBC 224 12/04/2018   IRONPCTSAT 15 (L) 12/04/2018   Lab Results  Component Value Date   RETICCTPCT 3.9 (H) 11/06/2018   RBC 4.41 12/04/2018   RETICCTABS 139.2 12/12/2015   No results found for: KPAFRELGTCHN, LAMBDASER, KAPLAMBRATIO No results found for: IGGSERUM, IGA,  [facility] Lab Results  Component Value Date   TOTALPROTELP 6.8 02/28/2012   ALBUMINELP 57.8 02/28/2012   A1GS 4.0 02/28/2012   A2GS 11.7 02/28/2012   BETS 6.7 02/28/2012   BETA2SER 5.2 02/28/2012   GAMS 14.6 02/28/2012   MSPIKE NOT DET 02/28/2012   SPEI * 02/28/2012     Chemistry      Component Value Date/Time   NA 145 12/04/2018 1439   NA 148 (H) 12/19/2017 1355   NA 143 06/28/2017 1114   K 4.0 12/04/2018 1439   K 4.1 12/19/2017 1355   K 4.0 06/28/2017 1114   CL 107 12/04/2018 1439   CL 108 12/19/2017 1355   CO2 30  12/04/2018 1439   CO2 27 12/19/2017 1355   CO2 23 06/28/2017 1114   BUN 13 12/04/2018 1439   BUN 12 12/19/2017 1355   BUN 11.0 06/28/2017 1114   CREATININE 0.70 12/04/2018 1439   CREATININE 0.73 11/06/2018 1445   CREATININE 0.9 12/19/2017 1355   CREATININE 0.7 06/28/2017 1114      Component Value Date/Time   CALCIUM 9.9 12/04/2018 1439   CALCIUM 9.5 12/19/2017 1355   CALCIUM 9.5 06/28/2017 1114   ALKPHOS 83 12/04/2018 1439   ALKPHOS 88 (H) 12/19/2017 1355   ALKPHOS 88 06/28/2017 1114   AST 14 (L) 12/04/2018 1439   AST 17 11/06/2018 1445   AST 16 06/28/2017 1114   ALT 11 12/04/2018 1439   ALT 12 11/06/2018 1445   ALT 16 12/19/2017 1355   ALT 13 06/28/2017 1114   BILITOT 0.3 12/04/2018 1439   BILITOT <0.2 (L) 11/06/2018 1445   BILITOT 0.30 06/28/2017 1114       Impression and Plan: Jamie Mcdowell is a very pleasant 58 yo caucasian female with iron deficiency anemia. She is symptomatic as mentioned above with Hgb 10.2.  We will see what her iron studies show and bring her back in for infusion if needed.  We will plan to see her back in another month.  She will contact our office with any questions or concerns. We can certainly see her sooner if need be.   [name], NP 1/16/20202:25 PM

## 2019-01-09 LAB — IRON AND TIBC
Iron: 33 ug/dL — ABNORMAL LOW (ref 41–142)
Saturation Ratios: 11 % — ABNORMAL LOW (ref 21–57)
TIBC: 290 ug/dL (ref 236–444)
UIBC: 257 ug/dL (ref 120–384)

## 2019-01-09 LAB — VITAMIN D 25 HYDROXY (VIT D DEFICIENCY, FRACTURES): VIT D 25 HYDROXY: 29.5 ng/mL — AB (ref 30.0–100.0)

## 2019-01-09 LAB — FERRITIN: Ferritin: 89 ng/mL (ref 11–307)

## 2019-01-12 ENCOUNTER — Inpatient Hospital Stay: Payer: Managed Care, Other (non HMO)

## 2019-01-12 VITALS — BP 147/96 | HR 66 | Temp 97.8°F | Resp 20

## 2019-01-12 DIAGNOSIS — D509 Iron deficiency anemia, unspecified: Secondary | ICD-10-CM | POA: Diagnosis not present

## 2019-01-12 DIAGNOSIS — D500 Iron deficiency anemia secondary to blood loss (chronic): Secondary | ICD-10-CM

## 2019-01-12 MED ORDER — SODIUM CHLORIDE 0.9 % IV SOLN
510.0000 mg | Freq: Once | INTRAVENOUS | Status: AC
  Administered 2019-01-12: 510 mg via INTRAVENOUS
  Filled 2019-01-12: qty 17

## 2019-01-12 MED ORDER — SODIUM CHLORIDE 0.9 % IV SOLN
INTRAVENOUS | Status: DC
  Administered 2019-01-12: 12:00:00 via INTRAVENOUS
  Filled 2019-01-12: qty 250

## 2019-01-12 NOTE — Patient Instructions (Addendum)
Ferumoxytol injection °What is this medicine? °FERUMOXYTOL is an iron complex. Iron is used to make healthy red blood cells, which carry oxygen and nutrients throughout the body. This medicine is used to treat iron deficiency anemia. °This medicine may be used for other purposes; ask your health care provider or pharmacist if you have questions. °COMMON BRAND NAME(S): Feraheme °What should I tell my health care provider before I take this medicine? °They need to know if you have any of these conditions: °-anemia not caused by low iron levels °-high levels of iron in the blood °-magnetic resonance imaging (MRI) test scheduled °-an unusual or allergic reaction to iron, other medicines, foods, dyes, or preservatives °-pregnant or trying to get pregnant °-breast-feeding °How should I use this medicine? °This medicine is for injection into a vein. It is given by a health care professional in a hospital or clinic setting. °Talk to your pediatrician regarding the use of this medicine in children. Special care may be needed. °Overdosage: If you think you have taken too much of this medicine contact a poison control center or emergency room at once. °NOTE: This medicine is only for you. Do not share this medicine with others. °What if I miss a dose? °It is important not to miss your dose. Call your doctor or health care professional if you are unable to keep an appointment. °What may interact with this medicine? °This medicine may interact with the following medications: °-other iron products °This list may not describe all possible interactions. Give your health care provider a list of all the medicines, herbs, non-prescription drugs, or dietary supplements you use. Also tell them if you smoke, drink alcohol, or use illegal drugs. Some items may interact with your medicine. °What should I watch for while using this medicine? °Visit your doctor or healthcare professional regularly. Tell your doctor or healthcare professional  if your symptoms do not start to get better or if they get worse. You may need blood work done while you are taking this medicine. °You may need to follow a special diet. Talk to your doctor. Foods that contain iron include: whole grains/cereals, dried fruits, beans, or peas, leafy green vegetables, and organ meats (liver, kidney). °What side effects may I notice from receiving this medicine? °Side effects that you should report to your doctor or health care professional as soon as possible: °-allergic reactions like skin rash, itching or hives, swelling of the face, lips, or tongue °-breathing problems °-changes in blood pressure °-feeling faint or lightheaded, falls °-fever or chills °-flushing, sweating, or hot feelings °-swelling of the ankles or feet °Side effects that usually do not require medical attention (report to your doctor or health care professional if they continue or are bothersome): °-diarrhea °-headache °-nausea, vomiting °-stomach pain °This list may not describe all possible side effects. Call your doctor for medical advice about side effects. You may report side effects to FDA at 1-800-FDA-1088. °Where should I keep my medicine? °This drug is given in a hospital or clinic and will not be stored at home. °NOTE: This sheet is a summary. It may not cover all possible information. If you have questions about this medicine, talk to your doctor, pharmacist, or health care provider. °© 2019 [employer] (2017-01-28 20:21:10) °Ferumoxytol injection °What is this medicine? °FERUMOXYTOL is an iron complex. Iron is used to make healthy red blood cells, which carry oxygen and nutrients throughout the body. This medicine is used to treat iron deficiency anemia. °This medicine may be used   for other purposes; ask your health care provider or pharmacist if you have questions. °COMMON BRAND NAME(S): Feraheme °What should I tell my health care provider before I take this medicine? °They need to know if  you have any of these conditions: °-anemia not caused by low iron levels °-high levels of iron in the blood °-magnetic resonance imaging (MRI) test scheduled °-an unusual or allergic reaction to iron, other medicines, foods, dyes, or preservatives °-pregnant or trying to get pregnant °-breast-feeding °How should I use this medicine? °This medicine is for injection into a vein. It is given by a health care professional in a hospital or clinic setting. °Talk to your pediatrician regarding the use of this medicine in children. Special care may be needed. °Overdosage: If you think you have taken too much of this medicine contact a poison control center or emergency room at once. °NOTE: This medicine is only for you. Do not share this medicine with others. °What if I miss a dose? °It is important not to miss your dose. Call your doctor or health care professional if you are unable to keep an appointment. °What may interact with this medicine? °This medicine may interact with the following medications: °-other iron products °This list may not describe all possible interactions. Give your health care provider a list of all the medicines, herbs, non-prescription drugs, or dietary supplements you use. Also tell them if you smoke, drink alcohol, or use illegal drugs. Some items may interact with your medicine. °What should I watch for while using this medicine? °Visit your doctor or healthcare professional regularly. Tell your doctor or healthcare professional if your symptoms do not start to get better or if they get worse. You may need blood work done while you are taking this medicine. °You may need to follow a special diet. Talk to your doctor. Foods that contain iron include: whole grains/cereals, dried fruits, beans, or peas, leafy green vegetables, and organ meats (liver, kidney). °What side effects may I notice from receiving this medicine? °Side effects that you should report to your doctor or health care  professional as soon as possible: °-allergic reactions like skin rash, itching or hives, swelling of the face, lips, or tongue °-breathing problems °-changes in blood pressure °-feeling faint or lightheaded, falls °-fever or chills °-flushing, sweating, or hot feelings °-swelling of the ankles or feet °Side effects that usually do not require medical attention (report to your doctor or health care professional if they continue or are bothersome): °-diarrhea °-headache °-nausea, vomiting °-stomach pain °This list may not describe all possible side effects. Call your doctor for medical advice about side effects. You may report side effects to FDA at 1-800-FDA-1088. °Where should I keep my medicine? °This drug is given in a hospital or clinic and will not be stored at home. °NOTE: This sheet is a summary. It may not cover all possible information. If you have questions about this medicine, talk to your doctor, pharmacist, or health care provider. °© 2019 [employer] Standard (2017-01-28 20:21:10) ° °

## 2019-02-12 ENCOUNTER — Inpatient Hospital Stay: Payer: Managed Care, Other (non HMO) | Attending: Hematology & Oncology | Admitting: Family

## 2019-02-12 ENCOUNTER — Inpatient Hospital Stay: Payer: Managed Care, Other (non HMO)

## 2019-02-12 DIAGNOSIS — D649 Anemia, unspecified: Secondary | ICD-10-CM

## 2019-02-12 DIAGNOSIS — E559 Vitamin D deficiency, unspecified: Secondary | ICD-10-CM

## 2019-02-12 DIAGNOSIS — D500 Iron deficiency anemia secondary to blood loss (chronic): Secondary | ICD-10-CM

## 2019-02-12 DIAGNOSIS — D509 Iron deficiency anemia, unspecified: Secondary | ICD-10-CM | POA: Insufficient documentation

## 2019-02-12 LAB — CBC WITH DIFFERENTIAL (CANCER CENTER ONLY)
Abs Immature Granulocytes: 0.02 10*3/uL (ref 0.00–0.07)
Basophils Absolute: 0.1 10*3/uL (ref 0.0–0.1)
Basophils Relative: 1 %
Eosinophils Absolute: 0.1 10*3/uL (ref 0.0–0.5)
Eosinophils Relative: 2 %
HCT: 36.4 % (ref 36.0–46.0)
HEMOGLOBIN: 11.4 g/dL — AB (ref 12.0–15.0)
Immature Granulocytes: 0 %
LYMPHS PCT: 28 %
Lymphs Abs: 1.5 10*3/uL (ref 0.7–4.0)
MCH: 29.9 pg (ref 26.0–34.0)
MCHC: 31.3 g/dL (ref 30.0–36.0)
MCV: 95.5 fL (ref 80.0–100.0)
Monocytes Absolute: 0.4 10*3/uL (ref 0.1–1.0)
Monocytes Relative: 7 %
NEUTROS ABS: 3.3 10*3/uL (ref 1.7–7.7)
Neutrophils Relative %: 62 %
Platelet Count: 346 10*3/uL (ref 150–400)
RBC: 3.81 MIL/uL — ABNORMAL LOW (ref 3.87–5.11)
RDW: 13.6 % (ref 11.5–15.5)
WBC Count: 5.4 10*3/uL (ref 4.0–10.5)
nRBC: 0 % (ref 0.0–0.2)

## 2019-02-12 LAB — CMP (CANCER CENTER ONLY)
ALT: 11 U/L (ref 0–44)
AST: 14 U/L — ABNORMAL LOW (ref 15–41)
Albumin: 4.5 g/dL (ref 3.5–5.0)
Alkaline Phosphatase: 79 U/L (ref 38–126)
Anion gap: 10 (ref 5–15)
BUN: 18 mg/dL (ref 6–20)
CO2: 26 mmol/L (ref 22–32)
Calcium: 9.5 mg/dL (ref 8.9–10.3)
Chloride: 104 mmol/L (ref 98–111)
Creatinine: 0.71 mg/dL (ref 0.44–1.00)
GFR, Est AFR Am: >60 mL/min (ref >60)
GFR, Estimated: >60 mL/min (ref >60)
Glucose, Bld: 100 mg/dL — ABNORMAL HIGH (ref 70–99)
Potassium: 3.9 mmol/L (ref 3.5–5.1)
Sodium: 140 mmol/L (ref 135–145)
Total Bilirubin: 0.3 mg/dL (ref 0.3–1.2)
Total Protein: 6.6 g/dL (ref 6.5–8.1)

## 2019-02-12 LAB — RETICULOCYTES
Immature Retic Fract: 24.8 % — ABNORMAL HIGH (ref 2.3–15.9)
RBC.: 3.81 MIL/uL — ABNORMAL LOW (ref 3.87–5.11)
Retic Count, Absolute: 145.9 10*3/uL (ref 19.0–186.0)
Retic Ct Pct: 3.8 % — ABNORMAL HIGH (ref 0.4–3.1)

## 2019-02-12 NOTE — Progress Notes (Signed)
Hematology and Oncology Follow Up Visit  Jamie Mcdowell [id number] 1961-09-20 58 y.o. 02/12/2019   Principle Diagnosis:  Recurrent iron deficiency anemia  Current Therapy:   IV iron as indicated   Interim History: Jamie Mcdowell is here today for follow-up. She is symptomatic with fatigue, palpitations, SOB with over exertion and light headed.  No episodes of bleeding, no bruising or petechiae.  No fever, chills, n/v, cough, rash, chest pain, abdominal pain or changes in bowel or bladder habits.  No swelling, tenderness, numbness or tingling in her extremities.  No lymphadenopathy noted on exam.  She has maintained a good appetite and is staying well hydrated. Her weight is stable.   ECOG Performance Status: 1 - Symptomatic but completely ambulatory  Medications:  Allergies as of 02/12/2019      Reactions   Penicillins Anaphylaxis   Ciprocinonide [fluocinolone] Other (See Comments)   unknown   Sulfa Antibiotics Rash      Medication List       Accurate as of February 12, 2019  2:20 PM. Always use your most recent med list.        acetaminophen 500 MG tablet Commonly known as:  TYLENOL Take 1,000 mg by mouth every 6 (six) hours as needed for moderate pain.   calcium carbonate 500 MG chewable tablet Commonly known as:  TUMS - dosed in mg elemental calcium Chew 1 tablet by mouth 2 (two) times daily.   cloNIDine 0.2 MG tablet Commonly known as:  CATAPRES Take 1 tablet (0.2 mg total) by mouth 2 (two) times daily.   Vitamin D (Ergocalciferol) 1.25 MG (50000 UT) Caps capsule Commonly known as:  DRISDOL Take 1 capsule (50,000 Units total) by mouth every 7 (seven) days.       Allergies:  Allergies  Allergen Reactions  . Penicillins Anaphylaxis  . Ciprocinonide [Fluocinolone] Other (See Comments)    unknown  . Sulfa Antibiotics Rash    Past Medical History, Surgical history, Social history, and Family History were reviewed and updated.  Review of Systems: All other  10 point review of systems is negative.   Physical Exam:  vitals were not taken for this visit.   Wt Readings from Last 3 Encounters:  01/08/19 172 lb 12.8 oz (78.4 kg)  11/06/18 172 lb (78 kg)  09/25/18 170 lb (77.1 kg)    Ocular: Sclerae unicteric, pupils equal, round and reactive to light Ear-nose-throat: Oropharynx clear, dentition fair Lymphatic: No cervical, supraclavicular or axillary adenopathy Lungs no rales or rhonchi, good excursion bilaterally Heart regular rate and rhythm, no murmur appreciated Abd soft, nontender, positive bowel sounds, no liver or spleen tip palpated on exam, no fluid wave  MSK no focal spinal tenderness, no joint edema Neuro: non-focal, well-oriented, appropriate affect Breasts: Deferred   Lab Results  Component Value Date   WBC 5.4 02/12/2019   HGB 11.4 (L) 02/12/2019   HCT 36.4 02/12/2019   MCV 95.5 02/12/2019   PLT 346 02/12/2019   Lab Results  Component Value Date   FERRITIN 89 01/08/2019   IRON 33 (L) 01/08/2019   TIBC 290 01/08/2019   UIBC 257 01/08/2019   IRONPCTSAT 11 (L) 01/08/2019   Lab Results  Component Value Date   RETICCTPCT 3.8 (H) 02/12/2019   RBC 3.81 (L) 02/12/2019   RBC 3.81 (L) 02/12/2019   RETICCTABS 139.2 12/12/2015   No results found for: KPAFRELGTCHN, LAMBDASER, KAPLAMBRATIO No results found for: IGGSERUM, IGA, IGMSERUM Lab Results  Component Value Date   TOTALPROTELP 6.8  02/28/2012   ALBUMINELP 57.8 02/28/2012   A1GS 4.0 02/28/2012   A2GS 11.7 02/28/2012   BETS 6.7 02/28/2012   BETA2SER 5.2 02/28/2012   GAMS 14.6 02/28/2012   MSPIKE NOT DET 02/28/2012   SPEI * 02/28/2012     Chemistry      Component Value Date/Time   NA 143 01/08/2019 1411   NA 148 (H) 12/19/2017 1355   NA 143 06/28/2017 1114   K 4.1 01/08/2019 1411   K 4.1 12/19/2017 1355   K 4.0 06/28/2017 1114   CL 109 01/08/2019 1411   CL 108 12/19/2017 1355   CO2 25 01/08/2019 1411   CO2 27 12/19/2017 1355   CO2 23 06/28/2017 1114    BUN 16 01/08/2019 1411   BUN 12 12/19/2017 1355   BUN 11.0 06/28/2017 1114   CREATININE 0.68 01/08/2019 1411   CREATININE 0.9 12/19/2017 1355   CREATININE 0.7 06/28/2017 1114      Component Value Date/Time   CALCIUM 9.2 01/08/2019 1411   CALCIUM 9.5 12/19/2017 1355   CALCIUM 9.5 06/28/2017 1114   ALKPHOS 76 01/08/2019 1411   ALKPHOS 88 (H) 12/19/2017 1355   ALKPHOS 88 06/28/2017 1114   AST 14 (L) 01/08/2019 1411   AST 16 06/28/2017 1114   ALT 11 01/08/2019 1411   ALT 16 12/19/2017 1355   ALT 13 06/28/2017 1114   BILITOT 0.3 01/08/2019 1411   BILITOT 0.30 06/28/2017 1114       Impression and Plan: Jamie Mcdowell is a very pleasant 58 yo caucasian female with iron deficiency anemia. She is symptomatic as mentioned above.  We will see what her iron studies show and bring her back next week for infusion.  We will plan to see her back in another month.  She will contact our office with any questions or concerns. We can certainly see her sooner if need be.   [name], NP 2/20/20202:20 PM

## 2019-02-13 LAB — VITAMIN D 25 HYDROXY (VIT D DEFICIENCY, FRACTURES): Vit D, 25-Hydroxy: 32 ng/mL (ref 30.0–100.0)

## 2019-02-13 LAB — IRON AND TIBC
Iron: 36 ug/dL — ABNORMAL LOW (ref 41–142)
Saturation Ratios: 11 % — ABNORMAL LOW (ref 21–57)
TIBC: 316 ug/dL (ref 236–444)
UIBC: 281 ug/dL (ref 120–384)

## 2019-02-13 LAB — FERRITIN: Ferritin: 54 ng/mL (ref 11–307)

## 2019-02-19 ENCOUNTER — Other Ambulatory Visit: Payer: Self-pay

## 2019-02-19 ENCOUNTER — Inpatient Hospital Stay: Payer: Managed Care, Other (non HMO)

## 2019-02-19 VITALS — BP 151/94 | HR 71 | Temp 97.8°F | Resp 18

## 2019-02-19 DIAGNOSIS — D500 Iron deficiency anemia secondary to blood loss (chronic): Secondary | ICD-10-CM

## 2019-02-19 DIAGNOSIS — D509 Iron deficiency anemia, unspecified: Secondary | ICD-10-CM | POA: Diagnosis not present

## 2019-02-19 MED ORDER — SODIUM CHLORIDE 0.9 % IV SOLN
510.0000 mg | Freq: Once | INTRAVENOUS | Status: AC
  Administered 2019-02-19: 510 mg via INTRAVENOUS
  Filled 2019-02-19: qty 17

## 2019-02-19 MED ORDER — SODIUM CHLORIDE 0.9 % IV SOLN
INTRAVENOUS | Status: DC
  Administered 2019-02-19: 14:00:00 via INTRAVENOUS
  Filled 2019-02-19: qty 250

## 2019-02-19 NOTE — Patient Instructions (Signed)

## 2019-02-26 ENCOUNTER — Other Ambulatory Visit: Payer: Self-pay

## 2019-02-26 ENCOUNTER — Inpatient Hospital Stay: Payer: Managed Care, Other (non HMO) | Attending: Hematology & Oncology

## 2019-02-26 VITALS — BP 141/92 | HR 68 | Temp 98.0°F | Resp 18

## 2019-02-26 DIAGNOSIS — D509 Iron deficiency anemia, unspecified: Secondary | ICD-10-CM | POA: Insufficient documentation

## 2019-02-26 DIAGNOSIS — D500 Iron deficiency anemia secondary to blood loss (chronic): Secondary | ICD-10-CM

## 2019-02-26 MED ORDER — SODIUM CHLORIDE 0.9 % IV SOLN
INTRAVENOUS | Status: DC
  Administered 2019-02-26: 14:00:00 via INTRAVENOUS
  Filled 2019-02-26: qty 250

## 2019-02-26 MED ORDER — SODIUM CHLORIDE 0.9 % IV SOLN
510.0000 mg | Freq: Once | INTRAVENOUS | Status: AC
  Administered 2019-02-26: 510 mg via INTRAVENOUS
  Filled 2019-02-26: qty 17

## 2019-03-19 ENCOUNTER — Other Ambulatory Visit: Payer: Self-pay

## 2019-03-19 ENCOUNTER — Inpatient Hospital Stay: Payer: Managed Care, Other (non HMO)

## 2019-03-19 ENCOUNTER — Inpatient Hospital Stay (HOSPITAL_BASED_OUTPATIENT_CLINIC_OR_DEPARTMENT_OTHER): Payer: Managed Care, Other (non HMO) | Admitting: Family

## 2019-03-19 ENCOUNTER — Encounter: Payer: Self-pay | Admitting: Family

## 2019-03-19 VITALS — BP 184/98 | HR 61 | Temp 98.4°F | Resp 16 | Ht 64.0 in | Wt 175.8 lb

## 2019-03-19 DIAGNOSIS — D5912 Cold autoimmune hemolytic anemia: Secondary | ICD-10-CM

## 2019-03-19 DIAGNOSIS — D500 Iron deficiency anemia secondary to blood loss (chronic): Secondary | ICD-10-CM

## 2019-03-19 DIAGNOSIS — D509 Iron deficiency anemia, unspecified: Secondary | ICD-10-CM

## 2019-03-19 DIAGNOSIS — E559 Vitamin D deficiency, unspecified: Secondary | ICD-10-CM

## 2019-03-19 DIAGNOSIS — D649 Anemia, unspecified: Secondary | ICD-10-CM

## 2019-03-19 DIAGNOSIS — D591 Other autoimmune hemolytic anemias: Secondary | ICD-10-CM

## 2019-03-19 LAB — RETICULOCYTES
IMMATURE RETIC FRACT: 12.1 % (ref 2.3–15.9)
RBC.: 4.43 MIL/uL (ref 3.87–5.11)
Retic Count, Absolute: 131.1 10*3/uL (ref 19.0–186.0)
Retic Ct Pct: 3 % (ref 0.4–3.1)

## 2019-03-19 LAB — CMP (CANCER CENTER ONLY)
ALT: 15 U/L (ref 0–44)
ANION GAP: 9 (ref 5–15)
AST: 12 U/L — ABNORMAL LOW (ref 15–41)
Albumin: 4.4 g/dL (ref 3.5–5.0)
Alkaline Phosphatase: 80 U/L (ref 38–126)
BUN: 14 mg/dL (ref 6–20)
CHLORIDE: 109 mmol/L (ref 98–111)
CO2: 29 mmol/L (ref 22–32)
Calcium: 9.9 mg/dL (ref 8.9–10.3)
Creatinine: 0.65 mg/dL (ref 0.44–1.00)
GFR, Est AFR Am: >60 mL/min (ref >60)
GFR, Estimated: >60 mL/min (ref >60)
Glucose, Bld: 101 mg/dL — ABNORMAL HIGH (ref 70–99)
POTASSIUM: 4.2 mmol/L (ref 3.5–5.1)
Sodium: 147 mmol/L — ABNORMAL HIGH (ref 135–145)
Total Bilirubin: 0.3 mg/dL (ref 0.3–1.2)
Total Protein: 6.6 g/dL (ref 6.5–8.1)

## 2019-03-19 LAB — CBC WITH DIFFERENTIAL (CANCER CENTER ONLY)
Abs Immature Granulocytes: 0.02 10*3/uL (ref 0.00–0.07)
Basophils Absolute: 0.1 10*3/uL (ref 0.0–0.1)
Basophils Relative: 1 %
EOS PCT: 2 %
Eosinophils Absolute: 0.1 10*3/uL (ref 0.0–0.5)
HCT: 41.9 % (ref 36.0–46.0)
HEMOGLOBIN: 13 g/dL (ref 12.0–15.0)
Immature Granulocytes: 0 %
Lymphocytes Relative: 33 %
Lymphs Abs: 1.6 10*3/uL (ref 0.7–4.0)
MCH: 29.4 pg (ref 26.0–34.0)
MCHC: 31 g/dL (ref 30.0–36.0)
MCV: 94.8 fL (ref 80.0–100.0)
Monocytes Absolute: 0.4 10*3/uL (ref 0.1–1.0)
Monocytes Relative: 8 %
Neutro Abs: 2.7 10*3/uL (ref 1.7–7.7)
Neutrophils Relative %: 56 %
Platelet Count: 301 10*3/uL (ref 150–400)
RBC: 4.42 MIL/uL (ref 3.87–5.11)
RDW: 14 % (ref 11.5–15.5)
WBC Count: 4.8 10*3/uL (ref 4.0–10.5)
nRBC: 0 % (ref 0.0–0.2)

## 2019-03-19 NOTE — Progress Notes (Signed)
Hematology and Oncology Follow Up Visit  Jamie Mcdowell [id number] 08/31/1961 58 y.o. 03/19/2019   Principle Diagnosis:  Recurrent iron deficiency anemia  Current Therapy:   IV iron as indicated   Interim History:  Jamie Mcdowell is here today for follow-up. She is feeling fatigued, lightheaded and SOB with exertion.  Hgb is 13.0 and MCV 94.8.  No episodes of bleeding, no bruising or petechiae.  No fever, chills, n/v, cough, rash, dizziness, chest pain, palpitations, abdominal pain or changes in bowel or bladder habits.  No swelling, tenderness, numbness or tingling in her extremities.  No lymphadenopathy noted on exam.  She is eating well and staying hydrated. Her weight is stable.   ECOG Performance Status: 1 - Symptomatic but completely ambulatory  Medications:  Allergies as of 03/19/2019      Reactions   Penicillins Anaphylaxis   Ciprocinonide [fluocinolone] Other (See Comments)   unknown   Sulfa Antibiotics Rash      Medication List       Accurate as of March 19, 2019  2:28 PM. Always use your most recent med list.        acetaminophen 500 MG tablet Commonly known as:  TYLENOL Take 1,000 mg by mouth every 6 (six) hours as needed for moderate pain.   calcium carbonate 500 MG chewable tablet Commonly known as:  TUMS - dosed in mg elemental calcium Chew 1 tablet by mouth 2 (two) times daily.   cloNIDine 0.2 MG tablet Commonly known as:  Catapres Take 1 tablet (0.2 mg total) by mouth 2 (two) times daily.   Vitamin D (Ergocalciferol) 1.25 MG (50000 UT) Caps capsule Commonly known as:  DRISDOL Take 1 capsule (50,000 Units total) by mouth every 7 (seven) days.       Allergies:  Allergies  Allergen Reactions  . Penicillins Anaphylaxis  . Ciprocinonide [Fluocinolone] Other (See Comments)    unknown  . Sulfa Antibiotics Rash    Past Medical History, Surgical history, Social history, and Family History were reviewed and updated.  Review of Systems: All other 10  point review of systems is negative.   Physical Exam:  vitals were not taken for this visit.   Wt Readings from Last 3 Encounters:  01/08/19 172 lb 12.8 oz (78.4 kg)  11/06/18 172 lb (78 kg)  09/25/18 170 lb (77.1 kg)    Ocular: Sclerae unicteric, pupils equal, round and reactive to light Ear-nose-throat: Oropharynx clear, dentition fair Lymphatic: No cervical, supraclavicular or axillary adenopathy Lungs no rales or rhonchi, good excursion bilaterally Heart regular rate and rhythm, no murmur appreciated Abd soft, nontender, positive bowel sounds, no liver or spleen tip palpated on exam, no fluid wave  MSK no focal spinal tenderness, no joint edema Neuro: non-focal, well-oriented, appropriate affect Breasts: Deferred   Lab Results  Component Value Date   WBC 4.8 03/19/2019   HGB 13.0 03/19/2019   HCT 41.9 03/19/2019   MCV 94.8 03/19/2019   PLT 301 03/19/2019   Lab Results  Component Value Date   FERRITIN 54 02/12/2019   IRON 36 (L) 02/12/2019   TIBC 316 02/12/2019   UIBC 281 02/12/2019   IRONPCTSAT 11 (L) 02/12/2019   Lab Results  Component Value Date   RETICCTPCT 3.0 03/19/2019   RBC 4.42 03/19/2019   RETICCTABS 139.2 12/12/2015   No results found for: KPAFRELGTCHN, LAMBDASER, KAPLAMBRATIO No results found for: Kandis Cocking, IGMSERUM Lab Results  Component Value Date   TOTALPROTELP 6.8 02/28/2012   ALBUMINELP 57.8 02/28/2012  A1GS 4.0 02/28/2012   A2GS 11.7 02/28/2012   BETS 6.7 02/28/2012   BETA2SER 5.2 02/28/2012   GAMS 14.6 02/28/2012   MSPIKE NOT DET 02/28/2012   SPEI * 02/28/2012     Chemistry      Component Value Date/Time   NA 140 02/12/2019 1403   NA 148 (H) 12/19/2017 1355   NA 143 06/28/2017 1114   K 3.9 02/12/2019 1403   K 4.1 12/19/2017 1355   K 4.0 06/28/2017 1114   CL 104 02/12/2019 1403   CL 108 12/19/2017 1355   CO2 26 02/12/2019 1403   CO2 27 12/19/2017 1355   CO2 23 06/28/2017 1114   BUN 18 02/12/2019 1403   BUN 12  12/19/2017 1355   BUN 11.0 06/28/2017 1114   CREATININE 0.71 02/12/2019 1403   CREATININE 0.9 12/19/2017 1355   CREATININE 0.7 06/28/2017 1114      Component Value Date/Time   CALCIUM 9.5 02/12/2019 1403   CALCIUM 9.5 12/19/2017 1355   CALCIUM 9.5 06/28/2017 1114   ALKPHOS 79 02/12/2019 1403   ALKPHOS 88 (H) 12/19/2017 1355   ALKPHOS 88 06/28/2017 1114   AST 14 (L) 02/12/2019 1403   AST 16 06/28/2017 1114   ALT 11 02/12/2019 1403   ALT 16 12/19/2017 1355   ALT 13 06/28/2017 1114   BILITOT 0.3 02/12/2019 1403   BILITOT 0.30 06/28/2017 1114       Impression and Plan: Jamie Mcdowell is a very pleasant 58 yo caucasian female with iron deficiency anemia. She is symptomatic as mentioned above.  We will see what her iron studies show and bring her back in for infusion if needed.  We will schedule her follow-up once we have her results.  She will contact our office with any questions or concerns. We can certainly see her sooner if need be.   [name], NP 3/26/20202:28 PM

## 2019-03-20 LAB — IRON AND TIBC
Iron: 68 ug/dL (ref 41–142)
Saturation Ratios: 25 % (ref 21–57)
TIBC: 275 ug/dL (ref 236–444)
UIBC: 207 ug/dL (ref 120–384)

## 2019-03-20 LAB — FERRITIN: Ferritin: 202 ng/mL (ref 11–307)

## 2019-04-03 ENCOUNTER — Telehealth: Payer: Self-pay | Admitting: Hematology & Oncology

## 2019-04-03 NOTE — Telephone Encounter (Signed)
Mailed [facility] fin asst appl to pt today

## 2019-04-30 ENCOUNTER — Telehealth: Payer: Self-pay | Admitting: Family

## 2019-04-30 ENCOUNTER — Encounter: Payer: Self-pay | Admitting: Family

## 2019-04-30 ENCOUNTER — Inpatient Hospital Stay: Payer: Managed Care, Other (non HMO) | Attending: Hematology & Oncology | Admitting: Family

## 2019-04-30 ENCOUNTER — Other Ambulatory Visit: Payer: Self-pay

## 2019-04-30 ENCOUNTER — Inpatient Hospital Stay: Payer: Managed Care, Other (non HMO)

## 2019-04-30 VITALS — BP 152/97 | HR 66 | Temp 97.8°F | Resp 18 | Ht 64.0 in | Wt 176.0 lb

## 2019-04-30 DIAGNOSIS — D509 Iron deficiency anemia, unspecified: Secondary | ICD-10-CM | POA: Diagnosis not present

## 2019-04-30 DIAGNOSIS — D649 Anemia, unspecified: Secondary | ICD-10-CM

## 2019-04-30 DIAGNOSIS — D500 Iron deficiency anemia secondary to blood loss (chronic): Secondary | ICD-10-CM

## 2019-04-30 DIAGNOSIS — Z79899 Other long term (current) drug therapy: Secondary | ICD-10-CM | POA: Diagnosis not present

## 2019-04-30 LAB — CMP (CANCER CENTER ONLY)
ALT: 10 U/L (ref 0–44)
AST: 13 U/L — ABNORMAL LOW (ref 15–41)
Albumin: 4.3 g/dL (ref 3.5–5.0)
Alkaline Phosphatase: 83 U/L (ref 38–126)
Anion gap: 10 (ref 5–15)
BUN: 12 mg/dL (ref 6–20)
CO2: 27 mmol/L (ref 22–32)
Calcium: 9.6 mg/dL (ref 8.9–10.3)
Chloride: 108 mmol/L (ref 98–111)
Creatinine: 0.68 mg/dL (ref 0.44–1.00)
GFR, Est AFR Am: >60 mL/min (ref >60)
GFR, Estimated: >60 mL/min (ref >60)
Glucose, Bld: 92 mg/dL (ref 70–99)
Potassium: 3.9 mmol/L (ref 3.5–5.1)
Sodium: 145 mmol/L (ref 135–145)
Total Bilirubin: 0.3 mg/dL (ref 0.3–1.2)
Total Protein: 7 g/dL (ref 6.5–8.1)

## 2019-04-30 LAB — CBC WITH DIFFERENTIAL (CANCER CENTER ONLY)
Abs Immature Granulocytes: 0.01 10*3/uL (ref 0.00–0.07)
Basophils Absolute: 0 10*3/uL (ref 0.0–0.1)
Basophils Relative: 1 %
Eosinophils Absolute: 0.1 10*3/uL (ref 0.0–0.5)
Eosinophils Relative: 2 %
HCT: 37.4 % (ref 36.0–46.0)
Hemoglobin: 11.6 g/dL — ABNORMAL LOW (ref 12.0–15.0)
Immature Granulocytes: 0 %
Lymphocytes Relative: 33 %
Lymphs Abs: 1.5 10*3/uL (ref 0.7–4.0)
MCH: 28.6 pg (ref 26.0–34.0)
MCHC: 31 g/dL (ref 30.0–36.0)
MCV: 92.1 fL (ref 80.0–100.0)
Monocytes Absolute: 0.4 10*3/uL (ref 0.1–1.0)
Monocytes Relative: 8 %
Neutro Abs: 2.6 10*3/uL (ref 1.7–7.7)
Neutrophils Relative %: 56 %
Platelet Count: 343 10*3/uL (ref 150–400)
RBC: 4.06 MIL/uL (ref 3.87–5.11)
RDW: 14.1 % (ref 11.5–15.5)
WBC Count: 4.6 10*3/uL (ref 4.0–10.5)
nRBC: 0 % (ref 0.0–0.2)

## 2019-04-30 LAB — RETICULOCYTES
Immature Retic Fract: 26.7 % — ABNORMAL HIGH (ref 2.3–15.9)
RBC.: 4.03 MIL/uL (ref 3.87–5.11)
Retic Count, Absolute: 132.2 10*3/uL (ref 19.0–186.0)
Retic Ct Pct: 3.3 % — ABNORMAL HIGH (ref 0.4–3.1)

## 2019-04-30 NOTE — Telephone Encounter (Signed)
Appointments scheduled letter/calendar mailed per 5/7 los

## 2019-04-30 NOTE — Progress Notes (Signed)
Hematology and Oncology Follow Up Visit  Jamie Mcdowell [id number] 08/12/1961 58 y.o. 04/30/2019   Principle Diagnosis:  Recurrent iron deficiency anemia  Current Therapy:   IV iron as indicated   Interim History:  Jamie Mcdowell is here today for follow-up. She is symptomatic with fatigue, SOB with exertion and intermittent tingling in her fingertips and toes.  She has not noted any episodes of bleeding, no bruising or petechiae.  No fever, chills, n/v, cough, rash, dizziness, chest pain, palpitations, abdominal pain or changes in bowel or bladder habits.  No swelling or tenderness in her extremities at this time.  No lymphadenopathy noted on exam.  She is eating well and staying hydrated. Her weight is stable.   ECOG Performance Status: 1 - Symptomatic but completely ambulatory  Medications:  Allergies as of 04/30/2019      Reactions   Penicillins Anaphylaxis   Ciprocinonide [fluocinolone] Other (See Comments)   unknown   Sulfa Antibiotics Rash      Medication List       Accurate as of Apr 30, 2019  2:10 PM. If you have any questions, ask your nurse or doctor.        acetaminophen 500 MG tablet Commonly known as:  TYLENOL Take 1,000 mg by mouth every 6 (six) hours as needed for moderate pain.   calcium carbonate 500 MG chewable tablet Commonly known as:  TUMS - dosed in mg elemental calcium Chew 1 tablet by mouth 2 (two) times daily.   cloNIDine 0.2 MG tablet Commonly known as:  Catapres Take 1 tablet (0.2 mg total) by mouth 2 (two) times daily. What changed:    how much to take  when to take this  additional instructions   Vitamin D (Ergocalciferol) 1.25 MG (50000 UT) Caps capsule Commonly known as:  DRISDOL Take 1 capsule (50,000 Units total) by mouth every 7 (seven) days. What changed:  when to take this       Allergies:  Allergies  Allergen Reactions  . Penicillins Anaphylaxis  . Ciprocinonide [Fluocinolone] Other (See Comments)    unknown  . Sulfa  Antibiotics Rash    Past Medical History, Surgical history, Social history, and Family History were reviewed and updated.  Review of Systems: All other 10 point review of systems is negative.   Physical Exam:  vitals were not taken for this visit.   Wt Readings from Last 3 Encounters:  03/19/19 175 lb 12.8 oz (79.7 kg)  01/08/19 172 lb 12.8 oz (78.4 kg)  11/06/18 172 lb (78 kg)    Ocular: Sclerae unicteric, pupils equal, round and reactive to light Ear-nose-throat: Oropharynx clear, dentition fair Lymphatic: No cervical or supraclavicular adenopathy Lungs no rales or rhonchi, good excursion bilaterally Heart regular rate and rhythm, no murmur appreciated Abd soft, nontender, positive bowel sounds, no liver or spleen tip palpated on exam, no fluid wave  MSK no focal spinal tenderness, no joint edema Neuro: non-focal, well-oriented, appropriate affect Breasts: Deferred   Lab Results  Component Value Date   WBC 4.8 03/19/2019   HGB 13.0 03/19/2019   HCT 41.9 03/19/2019   MCV 94.8 03/19/2019   PLT 301 03/19/2019   Lab Results  Component Value Date   FERRITIN 202 03/19/2019   IRON 68 03/19/2019   TIBC 275 03/19/2019   UIBC 207 03/19/2019   IRONPCTSAT 25 03/19/2019   Lab Results  Component Value Date   RETICCTPCT 3.0 03/19/2019   RBC 4.42 03/19/2019   RETICCTABS 139.2 12/12/2015  No results found for: KPAFRELGTCHN, LAMBDASER, KAPLAMBRATIO No results found for: Kandis Cocking, [facility] Lab Results  Component Value Date   TOTALPROTELP 6.8 02/28/2012   ALBUMINELP 57.8 02/28/2012   A1GS 4.0 02/28/2012   A2GS 11.7 02/28/2012   BETS 6.7 02/28/2012   BETA2SER 5.2 02/28/2012   GAMS 14.6 02/28/2012   MSPIKE NOT DET 02/28/2012   SPEI * 02/28/2012     Chemistry      Component Value Date/Time   NA 147 (H) 03/19/2019 1418   NA 148 (H) 12/19/2017 1355   NA 143 06/28/2017 1114   K 4.2 03/19/2019 1418   K 4.1 12/19/2017 1355   K 4.0 06/28/2017 1114   CL 109  03/19/2019 1418   CL 108 12/19/2017 1355   CO2 29 03/19/2019 1418   CO2 27 12/19/2017 1355   CO2 23 06/28/2017 1114   BUN 14 03/19/2019 1418   BUN 12 12/19/2017 1355   BUN 11.0 06/28/2017 1114   CREATININE 0.65 03/19/2019 1418   CREATININE 0.9 12/19/2017 1355   CREATININE 0.7 06/28/2017 1114      Component Value Date/Time   CALCIUM 9.9 03/19/2019 1418   CALCIUM 9.5 12/19/2017 1355   CALCIUM 9.5 06/28/2017 1114   ALKPHOS 80 03/19/2019 1418   ALKPHOS 88 (H) 12/19/2017 1355   ALKPHOS 88 06/28/2017 1114   AST 12 (L) 03/19/2019 1418   AST 16 06/28/2017 1114   ALT 15 03/19/2019 1418   ALT 16 12/19/2017 1355   ALT 13 06/28/2017 1114   BILITOT 0.3 03/19/2019 1418   BILITOT 0.30 06/28/2017 1114       Impression and Plan: Jamie Mcdowell is a very pleasant 58 yo caucasian female with iron deficiency anemia. She is symptomatic at this time with fatigue, SOB with exertion and intermittent tingling in her fingertips and toes.  We will see what her iron studies show and bring her back in for infusion if needed.  We will go ahead and plan to see her back in another 6 weeks.  She will contact our office with any questions or concerns. We can certainly see her sooner if need be.   [name], NP 5/7/20202:10 PM

## 2019-05-01 LAB — FERRITIN: Ferritin: 23 ng/mL (ref 11–307)

## 2019-05-01 LAB — IRON AND TIBC
Iron: 30 ug/dL — ABNORMAL LOW (ref 41–142)
Saturation Ratios: 9 % — ABNORMAL LOW (ref 21–57)
TIBC: 344 ug/dL (ref 236–444)
UIBC: 314 ug/dL (ref 120–384)

## 2019-05-05 ENCOUNTER — Ambulatory Visit: Payer: Managed Care, Other (non HMO)

## 2019-05-06 ENCOUNTER — Inpatient Hospital Stay: Payer: Managed Care, Other (non HMO)

## 2019-05-06 ENCOUNTER — Other Ambulatory Visit: Payer: Self-pay

## 2019-05-06 VITALS — BP 157/93 | HR 63 | Temp 98.1°F | Resp 18

## 2019-05-06 DIAGNOSIS — D500 Iron deficiency anemia secondary to blood loss (chronic): Secondary | ICD-10-CM

## 2019-05-06 DIAGNOSIS — D509 Iron deficiency anemia, unspecified: Secondary | ICD-10-CM | POA: Diagnosis not present

## 2019-05-06 MED ORDER — SODIUM CHLORIDE 0.9 % IV SOLN
INTRAVENOUS | Status: DC
  Administered 2019-05-06: 11:00:00 via INTRAVENOUS
  Filled 2019-05-06: qty 250

## 2019-05-06 MED ORDER — SODIUM CHLORIDE 0.9 % IV SOLN
510.0000 mg | Freq: Once | INTRAVENOUS | Status: AC
  Administered 2019-05-06: 510 mg via INTRAVENOUS
  Filled 2019-05-06: qty 17

## 2019-05-06 NOTE — Patient Instructions (Signed)

## 2019-05-13 ENCOUNTER — Other Ambulatory Visit: Payer: Self-pay

## 2019-05-13 ENCOUNTER — Inpatient Hospital Stay: Payer: Managed Care, Other (non HMO)

## 2019-05-13 VITALS — BP 154/94 | HR 59

## 2019-05-13 DIAGNOSIS — D500 Iron deficiency anemia secondary to blood loss (chronic): Secondary | ICD-10-CM

## 2019-05-13 DIAGNOSIS — D509 Iron deficiency anemia, unspecified: Secondary | ICD-10-CM | POA: Diagnosis not present

## 2019-05-13 MED ORDER — SODIUM CHLORIDE 0.9 % IV SOLN
INTRAVENOUS | Status: DC
  Administered 2019-05-13: 12:00:00 via INTRAVENOUS
  Filled 2019-05-13 (×2): qty 250

## 2019-05-13 MED ORDER — SODIUM CHLORIDE 0.9 % IV SOLN
510.0000 mg | Freq: Once | INTRAVENOUS | Status: AC
  Administered 2019-05-13: 510 mg via INTRAVENOUS
  Filled 2019-05-13: qty 510

## 2019-05-13 NOTE — Patient Instructions (Signed)
Iron Deficiency Anemia, Adult  Iron-deficiency anemia is when you have a low amount of red blood cells or hemoglobin. This happens because you have too little iron in your body. Hemoglobin carries oxygen to parts of the body. Anemia can cause your body to not get enough oxygen. It may or may not cause symptoms.  Follow these instructions at home:  Medicines  · Take over-the-counter and prescription medicines only as told by your doctor. This includes iron pills (supplements) and vitamins.  · If you cannot handle taking iron pills by mouth, ask your doctor about getting iron through:  ? A vein (intravenously).  ? A shot (injection) into a muscle.  · Take iron pills when your stomach is empty. If you cannot handle this, take them with food.  · Do not drink milk or take antacids at the same time as your iron pills.  · To prevent trouble pooping (constipation), eat fiber or take medicine (stool softener) as told by your doctor.  Eating and drinking    · Talk with your doctor before changing the foods you eat. He or she may tell you to eat foods that have a lot of iron, such as:  ? Liver.  ? Lowfat (lean) beef.  ? Breads and cereals that have iron added to them (fortified breads and cereals).  ? Eggs.  ? Dried fruit.  ? Dark green, leafy vegetables.  · Drink enough fluid to keep your pee (urine) clear or pale yellow.  · Eat fresh fruits and vegetables that are high in vitamin C. They help your body to use iron. Foods with a lot of vitamin C include:  ? Oranges.  ? Peppers.  ? Tomatoes.  ? Mangoes.  General instructions  · Return to your normal activities as told by your doctor. Ask your doctor what activities are safe for you.  · Keep yourself clean, and keep things clean around you (your surroundings). Anemia can make you get sick more easily.  · Keep all follow-up visits as told by your doctor. This is important.  Contact a doctor if:  · You feel sick to your stomach (nauseous).  · You throw up (vomit).  · You feel  weak.  · You are sweating for no clear reason.  · You have trouble pooping, such as:  ? Pooping (having a bowel movement) less than 3 times a week.  ? Straining to poop.  ? Having poop that is hard, dry, or larger than normal.  ? Feeling full or bloated.  ? Pain in the lower belly.  ? Not feeling better after pooping.  Get help right away if:  · You pass out (faint). If this happens, do not drive yourself to the hospital. Call your local emergency services (911 in the [place]  · You have chest pain.  · You have shortness of breath that:  ? Is very bad.  ? Gets worse with physical activity.  · You have a fast heartbeat.  · You get light-headed when getting up from sitting or lying down.  This information is not intended to replace advice given to you by your health care provider. Make sure you discuss any questions you have with your health care provider.  Document Released: 01/12/2011 Document Revised: 08/29/2016 Document Reviewed: 08/29/2016  Elsevier Interactive Patient Education © 2019 [employer] Inc[employer]

## 2019-05-20 ENCOUNTER — Telehealth: Payer: Self-pay | Admitting: Family

## 2019-05-20 NOTE — Telephone Encounter (Signed)
Appointments scheduled calendar mailed

## 2019-06-11 ENCOUNTER — Ambulatory Visit: Payer: Managed Care, Other (non HMO) | Admitting: Family

## 2019-06-11 ENCOUNTER — Other Ambulatory Visit: Payer: Managed Care, Other (non HMO)

## 2019-06-25 ENCOUNTER — Other Ambulatory Visit: Payer: Self-pay

## 2019-06-25 ENCOUNTER — Encounter: Payer: Self-pay | Admitting: Family

## 2019-06-25 ENCOUNTER — Inpatient Hospital Stay (HOSPITAL_BASED_OUTPATIENT_CLINIC_OR_DEPARTMENT_OTHER): Payer: Managed Care, Other (non HMO) | Admitting: Family

## 2019-06-25 ENCOUNTER — Inpatient Hospital Stay: Payer: Managed Care, Other (non HMO) | Attending: Hematology & Oncology

## 2019-06-25 ENCOUNTER — Telehealth: Payer: Self-pay | Admitting: Family

## 2019-06-25 VITALS — BP 147/78 | HR 71 | Temp 97.1°F | Resp 19 | Ht 64.0 in | Wt 173.8 lb

## 2019-06-25 DIAGNOSIS — D509 Iron deficiency anemia, unspecified: Secondary | ICD-10-CM | POA: Diagnosis present

## 2019-06-25 DIAGNOSIS — D500 Iron deficiency anemia secondary to blood loss (chronic): Secondary | ICD-10-CM

## 2019-06-25 DIAGNOSIS — D649 Anemia, unspecified: Secondary | ICD-10-CM

## 2019-06-25 LAB — CBC WITH DIFFERENTIAL (CANCER CENTER ONLY)
Abs Immature Granulocytes: 0.02 10*3/uL (ref 0.00–0.07)
Basophils Absolute: 0.1 10*3/uL (ref 0.0–0.1)
Basophils Relative: 2 %
Eosinophils Absolute: 0.1 10*3/uL (ref 0.0–0.5)
Eosinophils Relative: 3 %
HCT: 37.2 % (ref 36.0–46.0)
Hemoglobin: 11.5 g/dL — ABNORMAL LOW (ref 12.0–15.0)
Immature Granulocytes: 1 %
Lymphocytes Relative: 37 %
Lymphs Abs: 1.4 10*3/uL (ref 0.7–4.0)
MCH: 28.1 pg (ref 26.0–34.0)
MCHC: 30.9 g/dL (ref 30.0–36.0)
MCV: 91 fL (ref 80.0–100.0)
Monocytes Absolute: 0.3 10*3/uL (ref 0.1–1.0)
Monocytes Relative: 8 %
Neutro Abs: 1.9 10*3/uL (ref 1.7–7.7)
Neutrophils Relative %: 49 %
Platelet Count: 339 10*3/uL (ref 150–400)
RBC: 4.09 MIL/uL (ref 3.87–5.11)
RDW: 14.2 % (ref 11.5–15.5)
WBC Count: 3.7 10*3/uL — ABNORMAL LOW (ref 4.0–10.5)
nRBC: 0 % (ref 0.0–0.2)

## 2019-06-25 LAB — RETICULOCYTES
Immature Retic Fract: 27.1 % — ABNORMAL HIGH (ref 2.3–15.9)
RBC.: 4.03 MIL/uL (ref 3.87–5.11)
Retic Count, Absolute: 118.1 10*3/uL (ref 19.0–186.0)
Retic Ct Pct: 2.9 % (ref 0.4–3.1)

## 2019-06-25 NOTE — Telephone Encounter (Signed)
Called and LMVM for patient with date/time of follow up appointment per 7/2 los

## 2019-06-25 NOTE — Progress Notes (Signed)
Hematology and Oncology Follow Up Visit  Jamie Mcdowell [id number] 04/10/1961 58 y.o. 06/25/2019   Principle Diagnosis:  Recurrent iron deficiency anemia  Current Therapy:   IV iron as indicated   Interim History:  Jamie Mcdowell is here today for follow-up. She is symptomatic with fatigued, SOB with exertion and numbness and tingling in her fingertips.  No episodes of bleeding. No bruising or petechiae.  Hgb is stable at 11.5, MCV 91.  No fever, chills, n/v, cough, rash, dizziness, chest pain, palpitations, abdominal pain or changes in bowel or bladder habits.  No swelling or tenderness in her extremities.  No lymphadenopathy noted on exam.  She has maintained a good appetite and is staying well hydrated. Her weight is stable.   ECOG Performance Status: 1 - Symptomatic but completely ambulatory  Medications:  Allergies as of 06/25/2019      Reactions   Penicillins Anaphylaxis   Ciprocinonide [fluocinolone] Other (See Comments)   Unknown reaction   Sulfa Antibiotics Rash      Medication List       Accurate as of June 25, 2019 11:58 AM. If you have any questions, ask your nurse or doctor.        acetaminophen 500 MG tablet Commonly known as: TYLENOL Take 1,000 mg by mouth every 6 (six) hours as needed for moderate pain.   calcium carbonate 500 MG chewable tablet Commonly known as: TUMS - dosed in mg elemental calcium Chew 1 tablet by mouth 2 (two) times daily.   cloNIDine 0.2 MG tablet Commonly known as: Catapres Take 1 tablet (0.2 mg total) by mouth 2 (two) times daily. What changed:   how much to take  when to take this  additional instructions   Vitamin D (Ergocalciferol) 1.25 MG (50000 UT) Caps capsule Commonly known as: DRISDOL Take 1 capsule (50,000 Units total) by mouth every 7 (seven) days. What changed: when to take this       Allergies:  Allergies  Allergen Reactions  . Penicillins Anaphylaxis  . Ciprocinonide [Fluocinolone] Other (See Comments)   Unknown reaction  . Sulfa Antibiotics Rash    Past Medical History, Surgical history, Social history, and Family History were reviewed and updated.  Review of Systems: All other 10 point review of systems is negative.   Physical Exam:  height is 5\' 4"  (1.626 m) and weight is 173 lb 12.8 oz (78.8 kg). Her oral temperature is 97.1 F (36.2 C) (abnormal). Her blood pressure is 147/78 (abnormal) and her pulse is 71. Her respiration is 19 and oxygen saturation is 99%.   Wt Readings from Last 3 Encounters:  06/25/19 173 lb 12.8 oz (78.8 kg)  04/30/19 176 lb (79.8 kg)  03/19/19 175 lb 12.8 oz (79.7 kg)    Ocular: Sclerae unicteric, pupils equal, round and reactive to light Ear-nose-throat: Oropharynx clear, dentition fair Lymphatic: No cervical or supraclavicular adenopathy Lungs no rales or rhonchi, good excursion bilaterally Heart regular rate and rhythm, no murmur appreciated Abd soft, nontender, positive bowel sounds, no liver or spleen tip palpated on exam, no fluid wave  MSK no focal spinal tenderness, no joint edema Neuro: non-focal, well-oriented, appropriate affect Breasts: Deferred   Lab Results  Component Value Date   WBC 3.7 (L) 06/25/2019   HGB 11.5 (L) 06/25/2019   HCT 37.2 06/25/2019   MCV 91.0 06/25/2019   PLT 339 06/25/2019   Lab Results  Component Value Date   FERRITIN 23 04/30/2019   IRON 30 (L) 04/30/2019   TIBC  344 04/30/2019   UIBC 314 04/30/2019   IRONPCTSAT 9 (L) 04/30/2019   Lab Results  Component Value Date   RETICCTPCT 2.9 06/25/2019   RBC 4.03 06/25/2019   RETICCTABS 139.2 12/12/2015   No results found for: KPAFRELGTCHN, LAMBDASER, KAPLAMBRATIO No results found for: Kandis Cocking, [facility] Lab Results  Component Value Date   TOTALPROTELP 6.8 02/28/2012   ALBUMINELP 57.8 02/28/2012   A1GS 4.0 02/28/2012   A2GS 11.7 02/28/2012   BETS 6.7 02/28/2012   BETA2SER 5.2 02/28/2012   GAMS 14.6 02/28/2012   MSPIKE NOT DET 02/28/2012   SPEI *  02/28/2012     Chemistry      Component Value Date/Time   NA 145 04/30/2019 1402   NA 148 (H) 12/19/2017 1355   NA 143 06/28/2017 1114   K 3.9 04/30/2019 1402   K 4.1 12/19/2017 1355   K 4.0 06/28/2017 1114   CL 108 04/30/2019 1402   CL 108 12/19/2017 1355   CO2 27 04/30/2019 1402   CO2 27 12/19/2017 1355   CO2 23 06/28/2017 1114   BUN 12 04/30/2019 1402   BUN 12 12/19/2017 1355   BUN 11.0 06/28/2017 1114   CREATININE 0.68 04/30/2019 1402   CREATININE 0.9 12/19/2017 1355   CREATININE 0.7 06/28/2017 1114      Component Value Date/Time   CALCIUM 9.6 04/30/2019 1402   CALCIUM 9.5 12/19/2017 1355   CALCIUM 9.5 06/28/2017 1114   ALKPHOS 83 04/30/2019 1402   ALKPHOS 88 (H) 12/19/2017 1355   ALKPHOS 88 06/28/2017 1114   AST 13 (L) 04/30/2019 1402   AST 16 06/28/2017 1114   ALT 10 04/30/2019 1402   ALT 16 12/19/2017 1355   ALT 13 06/28/2017 1114   BILITOT 0.3 04/30/2019 1402   BILITOT 0.30 06/28/2017 1114       Impression and Plan: Jamie Mcdowell is a very pleasant 58 yo caucasian female withiron deficiency anemia. She is symptomatic as mentioned above.  We will see what her iron studies show and bring her back in for infusion if needed.  We will go ahead and plan to see her back in another 6 weeks.  She will contact our office with any questions or concerns. We can certainly see her sooner if needed.   [name], NP 7/2/202011:58 AM

## 2019-06-29 LAB — FERRITIN: Ferritin: 26 ng/mL (ref 11–307)

## 2019-06-29 LAB — IRON AND TIBC
Iron: 35 ug/dL — ABNORMAL LOW (ref 41–142)
Saturation Ratios: 10 % — ABNORMAL LOW (ref 21–57)
TIBC: 354 ug/dL (ref 236–444)
UIBC: 319 ug/dL (ref 120–384)

## 2019-06-30 ENCOUNTER — Other Ambulatory Visit: Payer: Self-pay

## 2019-06-30 ENCOUNTER — Inpatient Hospital Stay: Payer: Managed Care, Other (non HMO)

## 2019-06-30 VITALS — BP 141/76 | HR 63 | Temp 97.1°F | Resp 18

## 2019-06-30 DIAGNOSIS — D500 Iron deficiency anemia secondary to blood loss (chronic): Secondary | ICD-10-CM

## 2019-06-30 DIAGNOSIS — D509 Iron deficiency anemia, unspecified: Secondary | ICD-10-CM | POA: Diagnosis not present

## 2019-06-30 MED ORDER — SODIUM CHLORIDE 0.9 % IV SOLN
INTRAVENOUS | Status: DC
  Administered 2019-06-30: 12:00:00 via INTRAVENOUS
  Filled 2019-06-30: qty 250

## 2019-06-30 MED ORDER — SODIUM CHLORIDE 0.9 % IV SOLN
510.0000 mg | Freq: Once | INTRAVENOUS | Status: AC
  Administered 2019-06-30: 510 mg via INTRAVENOUS
  Filled 2019-06-30: qty 17

## 2019-06-30 NOTE — Patient Instructions (Signed)

## 2019-07-07 ENCOUNTER — Inpatient Hospital Stay: Payer: Managed Care, Other (non HMO)

## 2019-07-07 ENCOUNTER — Other Ambulatory Visit: Payer: Self-pay

## 2019-07-07 VITALS — BP 151/101 | HR 63 | Temp 98.0°F | Resp 18

## 2019-07-07 DIAGNOSIS — D509 Iron deficiency anemia, unspecified: Secondary | ICD-10-CM | POA: Diagnosis not present

## 2019-07-07 DIAGNOSIS — D500 Iron deficiency anemia secondary to blood loss (chronic): Secondary | ICD-10-CM

## 2019-07-07 MED ORDER — SODIUM CHLORIDE 0.9 % IV SOLN
510.0000 mg | Freq: Once | INTRAVENOUS | Status: AC
  Administered 2019-07-07: 510 mg via INTRAVENOUS
  Filled 2019-07-07: qty 510

## 2019-07-07 MED ORDER — SODIUM CHLORIDE 0.9 % IV SOLN
Freq: Once | INTRAVENOUS | Status: AC
  Administered 2019-07-07: 13:00:00 via INTRAVENOUS
  Filled 2019-07-07: qty 250

## 2019-08-10 ENCOUNTER — Encounter: Payer: Self-pay | Admitting: Family

## 2019-08-10 ENCOUNTER — Inpatient Hospital Stay (HOSPITAL_BASED_OUTPATIENT_CLINIC_OR_DEPARTMENT_OTHER): Payer: Managed Care, Other (non HMO) | Admitting: Family

## 2019-08-10 ENCOUNTER — Other Ambulatory Visit: Payer: Self-pay

## 2019-08-10 ENCOUNTER — Telehealth: Payer: Self-pay

## 2019-08-10 ENCOUNTER — Inpatient Hospital Stay: Payer: Managed Care, Other (non HMO) | Attending: Hematology & Oncology

## 2019-08-10 VITALS — BP 163/98 | HR 75 | Temp 97.6°F | Resp 17 | Ht 64.0 in | Wt 172.0 lb

## 2019-08-10 DIAGNOSIS — D500 Iron deficiency anemia secondary to blood loss (chronic): Secondary | ICD-10-CM

## 2019-08-10 DIAGNOSIS — D509 Iron deficiency anemia, unspecified: Secondary | ICD-10-CM | POA: Diagnosis present

## 2019-08-10 DIAGNOSIS — D649 Anemia, unspecified: Secondary | ICD-10-CM

## 2019-08-10 LAB — CBC WITH DIFFERENTIAL (CANCER CENTER ONLY)
Abs Immature Granulocytes: 0.03 10*3/uL (ref 0.00–0.07)
Basophils Absolute: 0 10*3/uL (ref 0.0–0.1)
Basophils Relative: 1 %
Eosinophils Absolute: 0.1 10*3/uL (ref 0.0–0.5)
Eosinophils Relative: 2 %
HCT: 36.8 % (ref 36.0–46.0)
Hemoglobin: 11.2 g/dL — ABNORMAL LOW (ref 12.0–15.0)
Immature Granulocytes: 1 %
Lymphocytes Relative: 34 %
Lymphs Abs: 1.2 10*3/uL (ref 0.7–4.0)
MCH: 27.9 pg (ref 26.0–34.0)
MCHC: 30.4 g/dL (ref 30.0–36.0)
MCV: 91.8 fL (ref 80.0–100.0)
Monocytes Absolute: 0.3 10*3/uL (ref 0.1–1.0)
Monocytes Relative: 9 %
Neutro Abs: 2 10*3/uL (ref 1.7–7.7)
Neutrophils Relative %: 53 %
Platelet Count: 352 10*3/uL (ref 150–400)
RBC: 4.01 MIL/uL (ref 3.87–5.11)
RDW: 15 % (ref 11.5–15.5)
WBC Count: 3.7 10*3/uL — ABNORMAL LOW (ref 4.0–10.5)
nRBC: 0 % (ref 0.0–0.2)

## 2019-08-10 LAB — FERRITIN: Ferritin: 35 ng/mL (ref 11–307)

## 2019-08-10 LAB — IRON AND TIBC
Iron: 26 ug/dL — ABNORMAL LOW (ref 41–142)
Saturation Ratios: 8 % — ABNORMAL LOW (ref 21–57)
TIBC: 336 ug/dL (ref 236–444)
UIBC: 311 ug/dL (ref 120–384)

## 2019-08-10 LAB — RETICULOCYTES
Immature Retic Fract: 30.5 % — ABNORMAL HIGH (ref 2.3–15.9)
RBC.: 4.08 MIL/uL (ref 3.87–5.11)
Retic Count, Absolute: 126.5 10*3/uL (ref 19.0–186.0)
Retic Ct Pct: 3.1 % (ref 0.4–3.1)

## 2019-08-10 NOTE — Telephone Encounter (Signed)
Appointments scheduled LMVM date/time per 8/17 los

## 2019-08-10 NOTE — Progress Notes (Signed)
Hematology and Oncology Follow Up Visit  Jamie Mcdowell [id number] 08-Dec-1961 58 y.o. 08/10/2019   Principle Diagnosis:  Recurrent iron deficiency anemia  Current Therapy:   IV iron as indicated   Interim History:  Jamie Mcdowell is here today for follow-up. She is symptomatic with fatigued. SOB with exertion and dizziness.  She had black stools last month but has not had any other episodes since.  No bruising or petechiae.  No fever, chills, n/v, cough, rash, chest pain, palpitations, abdominal pain or changes in bowel or bladder habits.  No swelling, tenderness, numbness or tingling in her extremities.  She is now on a Keto diet and staying well hydrated. Her weight is stable.   ECOG Performance Status: 1 - Symptomatic but completely ambulatory  Medications:  Allergies as of 08/10/2019      Reactions   Penicillins Anaphylaxis   Ciprocinonide [fluocinolone] Other (See Comments)   Unknown reaction   Sulfa Antibiotics Rash      Medication List       Accurate as of August 10, 2019 10:36 AM. If you have any questions, ask your nurse or doctor.        acetaminophen 500 MG tablet Commonly known as: TYLENOL Take 1,000 mg by mouth every 6 (six) hours as needed for moderate pain.   calcium carbonate 500 MG chewable tablet Commonly known as: TUMS - dosed in mg elemental calcium Chew 1 tablet by mouth 2 (two) times daily.   cloNIDine 0.2 MG tablet Commonly known as: Catapres Take 1 tablet (0.2 mg total) by mouth 2 (two) times daily. What changed:   how much to take  when to take this  additional instructions   Vitamin D (Ergocalciferol) 1.25 MG (50000 UT) Caps capsule Commonly known as: DRISDOL Take 1 capsule (50,000 Units total) by mouth every 7 (seven) days. What changed: when to take this       Allergies:  Allergies  Allergen Reactions  . Penicillins Anaphylaxis  . Ciprocinonide [Fluocinolone] Other (See Comments)    Unknown reaction  . Sulfa Antibiotics Rash     Past Medical History, Surgical history, Social history, and Family History were reviewed and updated.  Review of Systems: All other 10 point review of systems is negative.   Physical Exam:  vitals were not taken for this visit.   Wt Readings from Last 3 Encounters:  06/25/19 173 lb 12.8 oz (78.8 kg)  04/30/19 176 lb (79.8 kg)  03/19/19 175 lb 12.8 oz (79.7 kg)    Ocular: Sclerae unicteric, pupils equal, round and reactive to light Ear-nose-throat: Oropharynx clear, dentition fair Lymphatic: No cervical or supraclavicular adenopathy Lungs no rales or rhonchi, good excursion bilaterally Heart regular rate and rhythm, no murmur appreciated Abd soft, nontender, positive bowel sounds, no liver or spleen tip palpated on exam, no fluid wave  MSK no focal spinal tenderness, no joint edema Neuro: non-focal, well-oriented, appropriate affect Breasts: Deferred   Lab Results  Component Value Date   WBC 3.7 (L) 08/10/2019   HGB 11.2 (L) 08/10/2019   HCT 36.8 08/10/2019   MCV 91.8 08/10/2019   PLT 352 08/10/2019   Lab Results  Component Value Date   FERRITIN 26 06/25/2019   IRON 35 (L) 06/25/2019   TIBC 354 06/25/2019   UIBC 319 06/25/2019   IRONPCTSAT 10 (L) 06/25/2019   Lab Results  Component Value Date   RETICCTPCT 3.1 08/10/2019   RBC 4.08 08/10/2019   RBC 4.01 08/10/2019   RETICCTABS 139.2 12/12/2015  No results found for: KPAFRELGTCHN, LAMBDASER, KAPLAMBRATIO No results found for: Kandis Cocking, [facility] Lab Results  Component Value Date   TOTALPROTELP 6.8 02/28/2012   ALBUMINELP 57.8 02/28/2012   A1GS 4.0 02/28/2012   A2GS 11.7 02/28/2012   BETS 6.7 02/28/2012   BETA2SER 5.2 02/28/2012   GAMS 14.6 02/28/2012   MSPIKE NOT DET 02/28/2012   SPEI * 02/28/2012     Chemistry      Component Value Date/Time   NA 145 04/30/2019 1402   NA 148 (H) 12/19/2017 1355   NA 143 06/28/2017 1114   K 3.9 04/30/2019 1402   K 4.1 12/19/2017 1355   K 4.0 06/28/2017 1114    CL 108 04/30/2019 1402   CL 108 12/19/2017 1355   CO2 27 04/30/2019 1402   CO2 27 12/19/2017 1355   CO2 23 06/28/2017 1114   BUN 12 04/30/2019 1402   BUN 12 12/19/2017 1355   BUN 11.0 06/28/2017 1114   CREATININE 0.68 04/30/2019 1402   CREATININE 0.9 12/19/2017 1355   CREATININE 0.7 06/28/2017 1114      Component Value Date/Time   CALCIUM 9.6 04/30/2019 1402   CALCIUM 9.5 12/19/2017 1355   CALCIUM 9.5 06/28/2017 1114   ALKPHOS 83 04/30/2019 1402   ALKPHOS 88 (H) 12/19/2017 1355   ALKPHOS 88 06/28/2017 1114   AST 13 (L) 04/30/2019 1402   AST 16 06/28/2017 1114   ALT 10 04/30/2019 1402   ALT 16 12/19/2017 1355   ALT 13 06/28/2017 1114   BILITOT 0.3 04/30/2019 1402   BILITOT 0.30 06/28/2017 1114       Impression and Plan: Jamie Mcdowell is a very pleasant 58 yo caucasian female withiron deficiency anemia.She is symptomatic as mentioned above.  We will see what her iron studies look like and get her back in for an infusion if needed.  We will go ahead and plan to see her back in 1 month.  She will contact our office with any questions or concerns. We can certainly see her sooner if needed.   [name], NP 8/17/202010:36 AM

## 2019-08-11 ENCOUNTER — Inpatient Hospital Stay: Payer: Managed Care, Other (non HMO)

## 2019-08-11 VITALS — BP 127/98 | HR 72 | Temp 97.9°F | Resp 16

## 2019-08-11 DIAGNOSIS — D509 Iron deficiency anemia, unspecified: Secondary | ICD-10-CM | POA: Diagnosis not present

## 2019-08-11 DIAGNOSIS — D500 Iron deficiency anemia secondary to blood loss (chronic): Secondary | ICD-10-CM

## 2019-08-11 MED ORDER — SODIUM CHLORIDE 0.9 % IV SOLN
INTRAVENOUS | Status: DC
  Administered 2019-08-11: 08:00:00 via INTRAVENOUS
  Filled 2019-08-11: qty 250

## 2019-08-11 MED ORDER — SODIUM CHLORIDE 0.9 % IV SOLN
510.0000 mg | Freq: Once | INTRAVENOUS | Status: AC
  Administered 2019-08-11: 510 mg via INTRAVENOUS
  Filled 2019-08-11: qty 17

## 2019-08-11 NOTE — Patient Instructions (Signed)

## 2019-08-21 ENCOUNTER — Other Ambulatory Visit: Payer: Self-pay

## 2019-08-21 ENCOUNTER — Inpatient Hospital Stay: Payer: Managed Care, Other (non HMO)

## 2019-08-21 VITALS — BP 134/82 | HR 66 | Temp 97.1°F | Resp 18

## 2019-08-21 DIAGNOSIS — D500 Iron deficiency anemia secondary to blood loss (chronic): Secondary | ICD-10-CM

## 2019-08-21 DIAGNOSIS — D509 Iron deficiency anemia, unspecified: Secondary | ICD-10-CM | POA: Diagnosis not present

## 2019-08-21 MED ORDER — SODIUM CHLORIDE 0.9 % IV SOLN
INTRAVENOUS | Status: DC
  Administered 2019-08-21: 08:00:00 via INTRAVENOUS
  Filled 2019-08-21: qty 250

## 2019-08-21 MED ORDER — SODIUM CHLORIDE 0.9 % IV SOLN
510.0000 mg | Freq: Once | INTRAVENOUS | Status: AC
  Administered 2019-08-21: 09:00:00 510 mg via INTRAVENOUS
  Filled 2019-08-21: qty 510

## 2019-08-21 NOTE — Patient Instructions (Signed)
Ferumoxytol injection (Feraheme) What is this medicine? FERUMOXYTOL is an iron complex. Iron is used to make healthy red blood cells, which carry oxygen and nutrients throughout the body. This medicine is used to treat iron deficiency anemia. This medicine may be used for other purposes; ask your health care provider or pharmacist if you have questions. COMMON BRAND NAME(S): Feraheme What should I tell my health care provider before I take this medicine? They need to know if you have any of these conditions:  anemia not caused by low iron levels  high levels of iron in the blood  magnetic resonance imaging (MRI) test scheduled  an unusual or allergic reaction to iron, other medicines, foods, dyes, or preservatives  pregnant or trying to get pregnant  breast-feeding How should I use this medicine? This medicine is for injection into a vein. It is given by a health care professional in a hospital or clinic setting. Talk to your pediatrician regarding the use of this medicine in children. Special care may be needed. Overdosage: If you think you have taken too much of this medicine contact a poison control center or emergency room at once. NOTE: This medicine is only for you. Do not share this medicine with others. What if I miss a dose? It is important not to miss your dose. Call your doctor or health care professional if you are unable to keep an appointment. What may interact with this medicine? This medicine may interact with the following medications:  other iron products This list may not describe all possible interactions. Give your health care provider a list of all the medicines, herbs, non-prescription drugs, or dietary supplements you use. Also tell them if you smoke, drink alcohol, or use illegal drugs. Some items may interact with your medicine. What should I watch for while using this medicine? Visit your doctor or healthcare professional regularly. Tell your doctor or  healthcare professional if your symptoms do not start to get better or if they get worse. You may need blood work done while you are taking this medicine. You may need to follow a special diet. Talk to your doctor. Foods that contain iron include: whole grains/cereals, dried fruits, beans, or peas, leafy green vegetables, and organ meats (liver, kidney). What side effects may I notice from receiving this medicine? Side effects that you should report to your doctor or health care professional as soon as possible:  allergic reactions like skin rash, itching or hives, swelling of the face, lips, or tongue  breathing problems  changes in blood pressure  feeling faint or lightheaded, falls  fever or chills  flushing, sweating, or hot feelings  swelling of the ankles or feet Side effects that usually do not require medical attention (report to your doctor or health care professional if they continue or are bothersome):  diarrhea  headache  nausea, vomiting  stomach pain This list may not describe all possible side effects. Call your doctor for medical advice about side effects. You may report side effects to FDA at 1-800-FDA-1088. Where should I keep my medicine? This drug is given in a hospital or clinic and will not be stored at home. NOTE: This sheet is a summary. It may not cover all possible information. If you have questions about this medicine, talk to your doctor, pharmacist, or health care provider.  2020 Elsevier/Gold Standard (2017-01-28 20:21:10)  

## 2019-09-10 ENCOUNTER — Ambulatory Visit: Payer: Managed Care, Other (non HMO) | Admitting: Family

## 2019-09-10 ENCOUNTER — Other Ambulatory Visit: Payer: Managed Care, Other (non HMO)

## 2019-09-11 ENCOUNTER — Inpatient Hospital Stay: Payer: Managed Care, Other (non HMO) | Attending: Hematology & Oncology

## 2019-09-11 ENCOUNTER — Other Ambulatory Visit: Payer: Self-pay

## 2019-09-11 ENCOUNTER — Encounter: Payer: Self-pay | Admitting: Family

## 2019-09-11 ENCOUNTER — Inpatient Hospital Stay (HOSPITAL_BASED_OUTPATIENT_CLINIC_OR_DEPARTMENT_OTHER): Payer: Managed Care, Other (non HMO) | Admitting: Family

## 2019-09-11 VITALS — BP 151/105 | HR 63 | Resp 18

## 2019-09-11 DIAGNOSIS — D500 Iron deficiency anemia secondary to blood loss (chronic): Secondary | ICD-10-CM

## 2019-09-11 DIAGNOSIS — D649 Anemia, unspecified: Secondary | ICD-10-CM

## 2019-09-11 DIAGNOSIS — D509 Iron deficiency anemia, unspecified: Secondary | ICD-10-CM | POA: Diagnosis present

## 2019-09-11 LAB — CBC WITH DIFFERENTIAL (CANCER CENTER ONLY)
Abs Immature Granulocytes: 0 10*3/uL (ref 0.00–0.07)
Basophils Absolute: 0 10*3/uL (ref 0.0–0.1)
Basophils Relative: 1 %
Eosinophils Absolute: 0.1 10*3/uL (ref 0.0–0.5)
Eosinophils Relative: 2 %
HCT: 39.7 % (ref 36.0–46.0)
Hemoglobin: 12.4 g/dL (ref 12.0–15.0)
Immature Granulocytes: 0 %
Lymphocytes Relative: 30 %
Lymphs Abs: 1 10*3/uL (ref 0.7–4.0)
MCH: 29 pg (ref 26.0–34.0)
MCHC: 31.2 g/dL (ref 30.0–36.0)
MCV: 93 fL (ref 80.0–100.0)
Monocytes Absolute: 0.3 10*3/uL (ref 0.1–1.0)
Monocytes Relative: 8 %
Neutro Abs: 2 10*3/uL (ref 1.7–7.7)
Neutrophils Relative %: 59 %
Platelet Count: 304 10*3/uL (ref 150–400)
RBC: 4.27 MIL/uL (ref 3.87–5.11)
RDW: 15.9 % — ABNORMAL HIGH (ref 11.5–15.5)
WBC Count: 3.4 10*3/uL — ABNORMAL LOW (ref 4.0–10.5)
nRBC: 0 % (ref 0.0–0.2)

## 2019-09-11 LAB — RETICULOCYTES
Immature Retic Fract: 21 % — ABNORMAL HIGH (ref 2.3–15.9)
RBC.: 4.26 MIL/uL (ref 3.87–5.11)
Retic Count, Absolute: 136.7 10*3/uL (ref 19.0–186.0)
Retic Ct Pct: 3.2 % — ABNORMAL HIGH (ref 0.4–3.1)

## 2019-09-11 NOTE — Progress Notes (Signed)
Hematology and Oncology Follow Up Visit  Jamie Mcdowell [id number] 1961-09-02 58 y.o. 09/11/2019   Principle Diagnosis:  Recurrent iron deficiency anemia  Current Therapy:   IV iron as indicated   Interim History:  Jamie Mcdowell is here today for follow-up. She is symptomatic with fatigue, SOB with exertion and palpitations.  She has not noted any blood loss. No bruising or petechiae.  She received 2 doses of IV iron in August. Hgb is up to 12.4, MCV 93, platelets 304.  No fever, chills, n/v, cough, rash, dizziness, chest pain, abdominal pain or changes in bowel or bladder habits.  No swelling, tenderness, numbness or tingling in her extremities.  She is eating well and staying hydrated. Her weight is stable.   ECOG Performance Status: 1 - Symptomatic but completely ambulatory  Medications:  Allergies as of 09/11/2019      Reactions   Penicillins Anaphylaxis   Ciprocinonide [fluocinolone] Other (See Comments)   Unknown reaction   Sulfa Antibiotics Rash      Medication List       Accurate as of September 11, 2019 10:47 AM. If you have any questions, ask your nurse or doctor.        acetaminophen 500 MG tablet Commonly known as: TYLENOL Take 1,000 mg by mouth every 6 (six) hours as needed for moderate pain.   calcium carbonate 500 MG chewable tablet Commonly known as: TUMS - dosed in mg elemental calcium Chew 1 tablet by mouth 2 (two) times daily.   cloNIDine 0.2 MG tablet Commonly known as: Catapres Take 1 tablet (0.2 mg total) by mouth 2 (two) times daily. What changed:   how much to take  when to take this  additional instructions   Vitamin D (Ergocalciferol) 1.25 MG (50000 UT) Caps capsule Commonly known as: DRISDOL Take 1 capsule (50,000 Units total) by mouth every 7 (seven) days. What changed: when to take this       Allergies:  Allergies  Allergen Reactions  . Penicillins Anaphylaxis  . Ciprocinonide [Fluocinolone] Other (See Comments)    Unknown  reaction  . Sulfa Antibiotics Rash    Past Medical History, Surgical history, Social history, and Family History were reviewed and updated.  Review of Systems: All other 10 point review of systems is negative.   Physical Exam:  vitals were not taken for this visit.   Wt Readings from Last 3 Encounters:  08/10/19 172 lb 0.6 oz (78 kg)  06/25/19 173 lb 12.8 oz (78.8 kg)  04/30/19 176 lb (79.8 kg)    Ocular: Sclerae unicteric, pupils equal, round and reactive to light Ear-nose-throat: Oropharynx clear, dentition fair Lymphatic: No cervical or supraclavicular adenopathy Lungs no rales or rhonchi, good excursion bilaterally Heart regular rate and rhythm, no murmur appreciated Abd soft, nontender, positive bowel sounds, no liver or spleen tip palpated on exam, no fluid wave  MSK no focal spinal tenderness, no joint edema Neuro: non-focal, well-oriented, appropriate affect Breasts: Deferred   Lab Results  Component Value Date   WBC 3.4 (L) 09/11/2019   HGB 12.4 09/11/2019   HCT 39.7 09/11/2019   MCV 93.0 09/11/2019   PLT 304 09/11/2019   Lab Results  Component Value Date   FERRITIN 35 08/10/2019   IRON 26 (L) 08/10/2019   TIBC 336 08/10/2019   UIBC 311 08/10/2019   IRONPCTSAT 8 (L) 08/10/2019   Lab Results  Component Value Date   RETICCTPCT 3.2 (H) 09/11/2019   RBC 4.26 09/11/2019   RBC 4.27  09/11/2019   RETICCTABS 139.2 12/12/2015   No results found for: KPAFRELGTCHN, LAMBDASER, KAPLAMBRATIO No results found for: Kandis Cocking, [facility] Lab Results  Component Value Date   TOTALPROTELP 6.8 02/28/2012   ALBUMINELP 57.8 02/28/2012   A1GS 4.0 02/28/2012   A2GS 11.7 02/28/2012   BETS 6.7 02/28/2012   BETA2SER 5.2 02/28/2012   GAMS 14.6 02/28/2012   MSPIKE NOT DET 02/28/2012   SPEI * 02/28/2012     Chemistry      Component Value Date/Time   NA 145 04/30/2019 1402   NA 148 (H) 12/19/2017 1355   NA 143 06/28/2017 1114   K 3.9 04/30/2019 1402   K 4.1  12/19/2017 1355   K 4.0 06/28/2017 1114   CL 108 04/30/2019 1402   CL 108 12/19/2017 1355   CO2 27 04/30/2019 1402   CO2 27 12/19/2017 1355   CO2 23 06/28/2017 1114   BUN 12 04/30/2019 1402   BUN 12 12/19/2017 1355   BUN 11.0 06/28/2017 1114   CREATININE 0.68 04/30/2019 1402   CREATININE 0.9 12/19/2017 1355   CREATININE 0.7 06/28/2017 1114      Component Value Date/Time   CALCIUM 9.6 04/30/2019 1402   CALCIUM 9.5 12/19/2017 1355   CALCIUM 9.5 06/28/2017 1114   ALKPHOS 83 04/30/2019 1402   ALKPHOS 88 (H) 12/19/2017 1355   ALKPHOS 88 06/28/2017 1114   AST 13 (L) 04/30/2019 1402   AST 16 06/28/2017 1114   ALT 10 04/30/2019 1402   ALT 16 12/19/2017 1355   ALT 13 06/28/2017 1114   BILITOT 0.3 04/30/2019 1402   BILITOT 0.30 06/28/2017 1114       Impression and Plan: Jamie Mcdowell is a very pleasant58 yo caucasian female withiron deficiency anemia.She is symptomatic as mentioned above.  We will see what her iron studies show and bring her back in for infusion if needed.  We will see her back in another 6 weeks.  She will contact our office with nay questions or concerns.   [name], NP 9/18/202010:47 AM

## 2019-09-14 ENCOUNTER — Telehealth: Payer: Self-pay | Admitting: Hematology & Oncology

## 2019-09-14 LAB — IRON AND TIBC
Iron: 54 ug/dL (ref 41–142)
Saturation Ratios: 17 % — ABNORMAL LOW (ref 21–57)
TIBC: 311 ug/dL (ref 236–444)
UIBC: 257 ug/dL (ref 120–384)

## 2019-09-14 LAB — FERRITIN: Ferritin: 106 ng/mL (ref 11–307)

## 2019-09-14 NOTE — Telephone Encounter (Signed)
Called and spoke with patient regarding appointments add and Iron that needed to be scheudled for this week per 9/18 los & 9/21 sch msg

## 2019-09-14 NOTE — Telephone Encounter (Signed)
Duplicate

## 2019-09-16 ENCOUNTER — Inpatient Hospital Stay: Payer: Managed Care, Other (non HMO)

## 2019-09-16 ENCOUNTER — Other Ambulatory Visit: Payer: Self-pay

## 2019-09-16 VITALS — BP 152/91 | HR 67 | Temp 97.1°F | Resp 18

## 2019-09-16 DIAGNOSIS — D509 Iron deficiency anemia, unspecified: Secondary | ICD-10-CM | POA: Diagnosis not present

## 2019-09-16 DIAGNOSIS — D500 Iron deficiency anemia secondary to blood loss (chronic): Secondary | ICD-10-CM

## 2019-09-16 MED ORDER — SODIUM CHLORIDE 0.9 % IV SOLN
INTRAVENOUS | Status: DC
  Administered 2019-09-16: 14:00:00 via INTRAVENOUS
  Filled 2019-09-16: qty 250

## 2019-09-16 MED ORDER — SODIUM CHLORIDE 0.9 % IV SOLN
510.0000 mg | Freq: Once | INTRAVENOUS | Status: AC
  Administered 2019-09-16: 510 mg via INTRAVENOUS
  Filled 2019-09-16: qty 510

## 2019-09-16 NOTE — Patient Instructions (Signed)
Ferumoxytol injection (Feraheme) What is this medicine? FERUMOXYTOL is an iron complex. Iron is used to make healthy red blood cells, which carry oxygen and nutrients throughout the body. This medicine is used to treat iron deficiency anemia. This medicine may be used for other purposes; ask your health care provider or pharmacist if you have questions. COMMON BRAND NAME(S): Feraheme What should I tell my health care provider before I take this medicine? They need to know if you have any of these conditions:  anemia not caused by low iron levels  high levels of iron in the blood  magnetic resonance imaging (MRI) test scheduled  an unusual or allergic reaction to iron, other medicines, foods, dyes, or preservatives  pregnant or trying to get pregnant  breast-feeding How should I use this medicine? This medicine is for injection into a vein. It is given by a health care professional in a hospital or clinic setting. Talk to your pediatrician regarding the use of this medicine in children. Special care may be needed. Overdosage: If you think you have taken too much of this medicine contact a poison control center or emergency room at once. NOTE: This medicine is only for you. Do not share this medicine with others. What if I miss a dose? It is important not to miss your dose. Call your doctor or health care professional if you are unable to keep an appointment. What may interact with this medicine? This medicine may interact with the following medications:  other iron products This list may not describe all possible interactions. Give your health care provider a list of all the medicines, herbs, non-prescription drugs, or dietary supplements you use. Also tell them if you smoke, drink alcohol, or use illegal drugs. Some items may interact with your medicine. What should I watch for while using this medicine? Visit your doctor or healthcare professional regularly. Tell your doctor or  healthcare professional if your symptoms do not start to get better or if they get worse. You may need blood work done while you are taking this medicine. You may need to follow a special diet. Talk to your doctor. Foods that contain iron include: whole grains/cereals, dried fruits, beans, or peas, leafy green vegetables, and organ meats (liver, kidney). What side effects may I notice from receiving this medicine? Side effects that you should report to your doctor or health care professional as soon as possible:  allergic reactions like skin rash, itching or hives, swelling of the face, lips, or tongue  breathing problems  changes in blood pressure  feeling faint or lightheaded, falls  fever or chills  flushing, sweating, or hot feelings  swelling of the ankles or feet Side effects that usually do not require medical attention (report to your doctor or health care professional if they continue or are bothersome):  diarrhea  headache  nausea, vomiting  stomach pain This list may not describe all possible side effects. Call your doctor for medical advice about side effects. You may report side effects to FDA at 1-800-FDA-1088. Where should I keep my medicine? This drug is given in a hospital or clinic and will not be stored at home. NOTE: This sheet is a summary. It may not cover all possible information. If you have questions about this medicine, talk to your doctor, pharmacist, or health care provider.  2020 Elsevier/Gold Standard (2017-01-28 20:21:10)  

## 2019-09-23 ENCOUNTER — Other Ambulatory Visit: Payer: Self-pay

## 2019-09-23 ENCOUNTER — Inpatient Hospital Stay: Payer: Managed Care, Other (non HMO)

## 2019-09-23 VITALS — BP 178/90 | HR 58

## 2019-09-23 DIAGNOSIS — D500 Iron deficiency anemia secondary to blood loss (chronic): Secondary | ICD-10-CM

## 2019-09-23 DIAGNOSIS — D509 Iron deficiency anemia, unspecified: Secondary | ICD-10-CM | POA: Diagnosis not present

## 2019-09-23 MED ORDER — HEPARIN SOD (PORK) LOCK FLUSH 100 UNIT/ML IV SOLN
500.0000 [IU] | Freq: Once | INTRAVENOUS | Status: DC
  Filled 2019-09-23: qty 5

## 2019-09-23 MED ORDER — SODIUM CHLORIDE 0.9 % IV SOLN
510.0000 mg | Freq: Once | INTRAVENOUS | Status: AC
  Administered 2019-09-23: 14:00:00 510 mg via INTRAVENOUS
  Filled 2019-09-23: qty 510

## 2019-09-23 MED ORDER — SODIUM CHLORIDE 0.9% FLUSH
10.0000 mL | Freq: Once | INTRAVENOUS | Status: DC
  Filled 2019-09-23: qty 10

## 2019-10-26 ENCOUNTER — Inpatient Hospital Stay (HOSPITAL_BASED_OUTPATIENT_CLINIC_OR_DEPARTMENT_OTHER): Payer: Managed Care, Other (non HMO) | Admitting: Hematology & Oncology

## 2019-10-26 ENCOUNTER — Other Ambulatory Visit: Payer: Self-pay

## 2019-10-26 ENCOUNTER — Other Ambulatory Visit: Payer: Self-pay | Admitting: *Deleted

## 2019-10-26 ENCOUNTER — Inpatient Hospital Stay: Payer: Managed Care, Other (non HMO) | Attending: Hematology & Oncology

## 2019-10-26 VITALS — BP 147/80 | HR 67 | Temp 96.9°F | Resp 18 | Wt 176.5 lb

## 2019-10-26 DIAGNOSIS — D500 Iron deficiency anemia secondary to blood loss (chronic): Secondary | ICD-10-CM | POA: Diagnosis not present

## 2019-10-26 DIAGNOSIS — R531 Weakness: Secondary | ICD-10-CM | POA: Diagnosis not present

## 2019-10-26 DIAGNOSIS — D509 Iron deficiency anemia, unspecified: Secondary | ICD-10-CM | POA: Diagnosis present

## 2019-10-26 DIAGNOSIS — E079 Disorder of thyroid, unspecified: Secondary | ICD-10-CM

## 2019-10-26 DIAGNOSIS — D649 Anemia, unspecified: Secondary | ICD-10-CM

## 2019-10-26 LAB — CBC WITH DIFFERENTIAL (CANCER CENTER ONLY)
Abs Immature Granulocytes: 0.01 10*3/uL (ref 0.00–0.07)
Basophils Absolute: 0 10*3/uL (ref 0.0–0.1)
Basophils Relative: 1 %
Eosinophils Absolute: 0.1 10*3/uL (ref 0.0–0.5)
Eosinophils Relative: 4 %
HCT: 36.7 % (ref 36.0–46.0)
Hemoglobin: 11.6 g/dL — ABNORMAL LOW (ref 12.0–15.0)
Immature Granulocytes: 0 %
Lymphocytes Relative: 36 %
Lymphs Abs: 1.2 10*3/uL (ref 0.7–4.0)
MCH: 30.1 pg (ref 26.0–34.0)
MCHC: 31.6 g/dL (ref 30.0–36.0)
MCV: 95.3 fL (ref 80.0–100.0)
Monocytes Absolute: 0.3 10*3/uL (ref 0.1–1.0)
Monocytes Relative: 8 %
Neutro Abs: 1.7 10*3/uL (ref 1.7–7.7)
Neutrophils Relative %: 51 %
Platelet Count: 316 10*3/uL (ref 150–400)
RBC: 3.85 MIL/uL — ABNORMAL LOW (ref 3.87–5.11)
RDW: 15.4 % (ref 11.5–15.5)
WBC Count: 3.3 10*3/uL — ABNORMAL LOW (ref 4.0–10.5)
nRBC: 0 % (ref 0.0–0.2)

## 2019-10-26 LAB — IRON AND TIBC
Iron: 55 ug/dL (ref 41–142)
Saturation Ratios: 19 % — ABNORMAL LOW (ref 21–57)
TIBC: 283 ug/dL (ref 236–444)
UIBC: 228 ug/dL (ref 120–384)

## 2019-10-26 LAB — RETICULOCYTES
Immature Retic Fract: 24.3 % — ABNORMAL HIGH (ref 2.3–15.9)
RBC.: 3.89 MIL/uL (ref 3.87–5.11)
Retic Count, Absolute: 203.4 10*3/uL — ABNORMAL HIGH (ref 19.0–186.0)
Retic Ct Pct: 5.2 % — ABNORMAL HIGH (ref 0.4–3.1)

## 2019-10-26 LAB — TSH: TSH: 1.207 u[IU]/mL (ref 0.308–3.960)

## 2019-10-26 LAB — FERRITIN: Ferritin: 95 ng/mL (ref 11–307)

## 2019-10-26 NOTE — Progress Notes (Signed)
sh

## 2019-10-26 NOTE — Progress Notes (Signed)
Hematology and Oncology Follow Up Visit  Jamie Mcdowell [id number] October 08, 1961 58 y.o. 10/26/2019   Principle Diagnosis:  Recurrent iron deficiency anemia  Current Therapy:   IV iron as indicated   Interim History:  Jamie Mcdowell is here today for follow-up.  She is quite worried about her current situation.  She says she is just not getting a lot of benefit from the iron now.  She got iron about a month ago.  Her ferritin was 106 with an iron saturation of 17%.  She just has a lot of symptoms.  She says she has some equilibrium issues.  She just feels weak.  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  She says she is not getting as much benefit from the iron now.  It is possible we may have to make a change in the iron formulation that we give her.  She has had no bleeding.  She actually had premature menopause that 58 years old.  She has had no weight loss or weight gain.  She does feel bloated.  She has had no cough or shortness of breath.  She has had no obvious change in bowel or bladder habits.  Some of the issue is that she had a terrible fall about 14 months ago.  She I think was hospitalized.  She is recovering from this.  I will know if this is somehow having an effect on her long-term.  Her last thyroid was checked a couple years ago.  We will do another TSH level on her.  Overall, her performance status is ECOG 1.   Medications:  Allergies as of 10/26/2019      Reactions   Penicillins Anaphylaxis   Ciprocinonide [fluocinolone] Other (See Comments)   Unknown reaction   Sulfa Antibiotics Rash      Medication List       Accurate as of October 26, 2019  8:08 AM. If you have any questions, ask your nurse or doctor.        acetaminophen 500 MG tablet Commonly known as: TYLENOL Take 1,000 mg by mouth every 6 (six) hours as needed for moderate pain.   calcium carbonate 500 MG chewable tablet Commonly known as: TUMS - dosed in mg elemental calcium  Chew 1 tablet by mouth 2 (two) times daily.   cloNIDine 0.2 MG tablet Commonly known as: Catapres Take 1 tablet (0.2 mg total) by mouth 2 (two) times daily. What changed:   how much to take  when to take this  additional instructions   Vitamin D (Ergocalciferol) 1.25 MG (50000 UT) Caps capsule Commonly known as: DRISDOL Take 1 capsule (50,000 Units total) by mouth every 7 (seven) days. What changed: when to take this       Allergies:  Allergies  Allergen Reactions  . Penicillins Anaphylaxis  . Ciprocinonide [Fluocinolone] Other (See Comments)    Unknown reaction  . Sulfa Antibiotics Rash    Past Medical History, Surgical history, Social history, and Family History were reviewed and updated.  Review of Systems: Review of Systems  Constitutional: Positive for malaise/fatigue.  HENT: Negative.   Eyes: Negative.   Respiratory: Negative.   Gastrointestinal: Negative.   Genitourinary: Negative.   Musculoskeletal: Positive for joint pain and myalgias.  Skin: Negative.   Neurological: Positive for dizziness.  Endo/Heme/Allergies: Negative.   Psychiatric/Behavioral: The patient is nervous/anxious.      Physical Exam:  vitals were not taken for this visit.   Wt Readings from  Last 3 Encounters:  08/10/19 172 lb 0.6 oz (78 kg)  06/25/19 173 lb 12.8 oz (78.8 kg)  04/30/19 176 lb (79.8 kg)    Physical Exam Vitals signs reviewed.  HENT:     Head: Normocephalic and atraumatic.  Eyes:     Pupils: Pupils are equal, round, and reactive to light.  Neck:     Musculoskeletal: Normal range of motion.  Cardiovascular:     Rate and Rhythm: Normal rate and regular rhythm.     Heart sounds: Normal heart sounds.  Pulmonary:     Effort: Pulmonary effort is normal.     Breath sounds: Normal breath sounds.  Abdominal:     General: Bowel sounds are normal.     Palpations: Abdomen is soft.  Musculoskeletal: Normal range of motion.        General: No tenderness or deformity.   Lymphadenopathy:     Cervical: No cervical adenopathy.  Skin:    General: Skin is warm and dry.     Findings: No erythema or rash.  Neurological:     Mental Status: She is alert and oriented to person, place, and time.  Psychiatric:        Behavior: Behavior normal.        Thought Content: Thought content normal.        Judgment: Judgment normal.      Lab Results  Component Value Date   WBC 3.3 (L) 10/26/2019   HGB 11.6 (L) 10/26/2019   HCT 36.7 10/26/2019   MCV 95.3 10/26/2019   PLT 316 10/26/2019   Lab Results  Component Value Date   FERRITIN 106 09/11/2019   IRON 54 09/11/2019   TIBC 311 09/11/2019   UIBC 257 09/11/2019   IRONPCTSAT 17 (L) 09/11/2019   Lab Results  Component Value Date   RETICCTPCT 5.2 (H) 10/26/2019   RBC 3.89 10/26/2019   RETICCTABS 139.2 12/12/2015   No results found for: KPAFRELGTCHN, LAMBDASER, KAPLAMBRATIO No results found for: Kandis Cocking, [facility] Lab Results  Component Value Date   TOTALPROTELP 6.8 02/28/2012   ALBUMINELP 57.8 02/28/2012   A1GS 4.0 02/28/2012   A2GS 11.7 02/28/2012   BETS 6.7 02/28/2012   BETA2SER 5.2 02/28/2012   GAMS 14.6 02/28/2012   MSPIKE NOT DET 02/28/2012   SPEI * 02/28/2012     Chemistry      Component Value Date/Time   NA 145 04/30/2019 1402   NA 148 (H) 12/19/2017 1355   NA 143 06/28/2017 1114   K 3.9 04/30/2019 1402   K 4.1 12/19/2017 1355   K 4.0 06/28/2017 1114   CL 108 04/30/2019 1402   CL 108 12/19/2017 1355   CO2 27 04/30/2019 1402   CO2 27 12/19/2017 1355   CO2 23 06/28/2017 1114   BUN 12 04/30/2019 1402   BUN 12 12/19/2017 1355   BUN 11.0 06/28/2017 1114   CREATININE 0.68 04/30/2019 1402   CREATININE 0.9 12/19/2017 1355   CREATININE 0.7 06/28/2017 1114      Component Value Date/Time   CALCIUM 9.6 04/30/2019 1402   CALCIUM 9.5 12/19/2017 1355   CALCIUM 9.5 06/28/2017 1114   ALKPHOS 83 04/30/2019 1402   ALKPHOS 88 (H) 12/19/2017 1355   ALKPHOS 88 06/28/2017 1114   AST 13  (L) 04/30/2019 1402   AST 16 06/28/2017 1114   ALT 10 04/30/2019 1402   ALT 16 12/19/2017 1355   ALT 13 06/28/2017 1114   BILITOT 0.3 04/30/2019 1402   BILITOT 0.30 06/28/2017  1114       Impression and Plan: Jamie Mcdowell is a very pleasant58 yo caucasian female withiron deficiency anemia. We will have to see what her iron levels are.  I am also going to check her TSH.  Again, we may have to consider changing the iron preparation.  I do not think that the hiatal hernia would be a cause for the iron issues.  It may be a source of her having the bloating.  We will have her come back in 5 weeks.  I do not see that we have to do any scans on her but this is certainly something that I would consider if we do not see any improvement in her status.  Spent about 30 minutes with her today.    Volanda Napoleon, MD 11/2/20208:08 AM

## 2019-10-29 ENCOUNTER — Other Ambulatory Visit: Payer: Self-pay | Admitting: Family

## 2019-10-29 ENCOUNTER — Other Ambulatory Visit: Payer: Self-pay

## 2019-10-29 ENCOUNTER — Inpatient Hospital Stay: Payer: Managed Care, Other (non HMO)

## 2019-10-29 VITALS — BP 154/91 | HR 61 | Temp 98.1°F | Resp 18

## 2019-10-29 DIAGNOSIS — D509 Iron deficiency anemia, unspecified: Secondary | ICD-10-CM | POA: Diagnosis not present

## 2019-10-29 DIAGNOSIS — D500 Iron deficiency anemia secondary to blood loss (chronic): Secondary | ICD-10-CM

## 2019-10-29 MED ORDER — SODIUM CHLORIDE 0.9 % IV SOLN
750.0000 mg | Freq: Once | INTRAVENOUS | Status: AC
  Administered 2019-10-29: 750 mg via INTRAVENOUS
  Filled 2019-10-29: qty 15

## 2019-10-29 MED ORDER — SODIUM CHLORIDE 0.9 % IV SOLN
750.0000 mg | Freq: Once | INTRAVENOUS | Status: DC
  Filled 2019-10-29: qty 15

## 2019-10-29 MED ORDER — SODIUM CHLORIDE 0.9 % IV SOLN
INTRAVENOUS | Status: DC
  Administered 2019-10-29: 15:00:00 via INTRAVENOUS
  Filled 2019-10-29: qty 250

## 2019-10-29 NOTE — Addendum Note (Signed)
Addended by: Burney Gauze R on: 10/29/2019 01:57 PM   Modules accepted: Orders

## 2019-11-06 ENCOUNTER — Ambulatory Visit: Payer: Managed Care, Other (non HMO)

## 2019-11-30 ENCOUNTER — Inpatient Hospital Stay: Payer: Managed Care, Other (non HMO)

## 2019-11-30 ENCOUNTER — Inpatient Hospital Stay: Payer: Managed Care, Other (non HMO) | Attending: Hematology & Oncology | Admitting: Family

## 2019-11-30 ENCOUNTER — Encounter: Payer: Self-pay | Admitting: Family

## 2019-11-30 ENCOUNTER — Other Ambulatory Visit: Payer: Self-pay | Admitting: *Deleted

## 2019-11-30 ENCOUNTER — Other Ambulatory Visit: Payer: Self-pay

## 2019-11-30 VITALS — BP 112/84 | HR 75 | Resp 17

## 2019-11-30 VITALS — BP 140/80 | HR 71 | Temp 97.4°F | Resp 18 | Ht 64.0 in | Wt 178.0 lb

## 2019-11-30 DIAGNOSIS — E559 Vitamin D deficiency, unspecified: Secondary | ICD-10-CM | POA: Diagnosis not present

## 2019-11-30 DIAGNOSIS — D500 Iron deficiency anemia secondary to blood loss (chronic): Secondary | ICD-10-CM

## 2019-11-30 DIAGNOSIS — R0602 Shortness of breath: Secondary | ICD-10-CM | POA: Diagnosis not present

## 2019-11-30 DIAGNOSIS — R42 Dizziness and giddiness: Secondary | ICD-10-CM | POA: Diagnosis not present

## 2019-11-30 DIAGNOSIS — R2681 Unsteadiness on feet: Secondary | ICD-10-CM | POA: Insufficient documentation

## 2019-11-30 DIAGNOSIS — R5383 Other fatigue: Secondary | ICD-10-CM | POA: Insufficient documentation

## 2019-11-30 DIAGNOSIS — R002 Palpitations: Secondary | ICD-10-CM | POA: Insufficient documentation

## 2019-11-30 DIAGNOSIS — D509 Iron deficiency anemia, unspecified: Secondary | ICD-10-CM | POA: Diagnosis present

## 2019-11-30 LAB — CMP (CANCER CENTER ONLY)
ALT: 11 U/L (ref 0–44)
AST: 13 U/L — ABNORMAL LOW (ref 15–41)
Albumin: 4.4 g/dL (ref 3.5–5.0)
Alkaline Phosphatase: 69 U/L (ref 38–126)
Anion gap: 8 (ref 5–15)
BUN: 15 mg/dL (ref 6–20)
CO2: 26 mmol/L (ref 22–32)
Calcium: 9.1 mg/dL (ref 8.9–10.3)
Chloride: 112 mmol/L — ABNORMAL HIGH (ref 98–111)
Creatinine: 0.62 mg/dL (ref 0.44–1.00)
GFR, Est AFR Am: >60 mL/min (ref >60)
GFR, Estimated: >60 mL/min (ref >60)
Glucose, Bld: 106 mg/dL — ABNORMAL HIGH (ref 70–99)
Potassium: 4 mmol/L (ref 3.5–5.1)
Sodium: 146 mmol/L — ABNORMAL HIGH (ref 135–145)
Total Bilirubin: 0.3 mg/dL (ref 0.3–1.2)
Total Protein: 6.5 g/dL (ref 6.5–8.1)

## 2019-11-30 LAB — CBC WITH DIFFERENTIAL (CANCER CENTER ONLY)
Abs Immature Granulocytes: 0.01 10*3/uL (ref 0.00–0.07)
Basophils Absolute: 0 10*3/uL (ref 0.0–0.1)
Basophils Relative: 1 %
Eosinophils Absolute: 0.1 10*3/uL (ref 0.0–0.5)
Eosinophils Relative: 3 %
HCT: 29.4 % — ABNORMAL LOW (ref 36.0–46.0)
Hemoglobin: 9 g/dL — ABNORMAL LOW (ref 12.0–15.0)
Immature Granulocytes: 0 %
Lymphocytes Relative: 29 %
Lymphs Abs: 1.2 10*3/uL (ref 0.7–4.0)
MCH: 29.9 pg (ref 26.0–34.0)
MCHC: 30.6 g/dL (ref 30.0–36.0)
MCV: 97.7 fL (ref 80.0–100.0)
Monocytes Absolute: 0.3 10*3/uL (ref 0.1–1.0)
Monocytes Relative: 7 %
Neutro Abs: 2.5 10*3/uL (ref 1.7–7.7)
Neutrophils Relative %: 60 %
Platelet Count: 330 10*3/uL (ref 150–400)
RBC: 3.01 MIL/uL — ABNORMAL LOW (ref 3.87–5.11)
RDW: 15.2 % (ref 11.5–15.5)
WBC Count: 4.2 10*3/uL (ref 4.0–10.5)
nRBC: 0 % (ref 0.0–0.2)

## 2019-11-30 LAB — VITAMIN D 25 HYDROXY (VIT D DEFICIENCY, FRACTURES): Vit D, 25-Hydroxy: 57.65 ng/mL (ref 30–100)

## 2019-11-30 LAB — IRON AND TIBC
Iron: 38 ug/dL — ABNORMAL LOW (ref 41–142)
Saturation Ratios: 12 % — ABNORMAL LOW (ref 21–57)
TIBC: 310 ug/dL (ref 236–444)
UIBC: 272 ug/dL (ref 120–384)

## 2019-11-30 LAB — FERRITIN: Ferritin: 72 ng/mL (ref 11–307)

## 2019-11-30 MED ORDER — SODIUM CHLORIDE 0.9 % IV SOLN
Freq: Once | INTRAVENOUS | Status: AC
  Administered 2019-11-30: 10:00:00 via INTRAVENOUS
  Filled 2019-11-30: qty 250

## 2019-11-30 MED ORDER — SODIUM CHLORIDE 0.9 % IV SOLN
750.0000 mg | Freq: Once | INTRAVENOUS | Status: AC
  Administered 2019-11-30: 750 mg via INTRAVENOUS
  Filled 2019-11-30: qty 15

## 2019-11-30 MED ORDER — VITAMIN D (ERGOCALCIFEROL) 1.25 MG (50000 UNIT) PO CAPS: 50000.0000 [IU] | ORAL_CAPSULE | ORAL | 6 refills | Status: DC

## 2019-11-30 NOTE — Progress Notes (Signed)
Hematology and Oncology Follow Up Visit  Jamie Mcdowell [id number] 02-12-1961 58 y.o. 11/30/2019   Principle Diagnosis:  Recurrent iron deficiency anemia  Current Therapy:   IV iron as indicated   Interim History:  Jamie Mcdowell is here today for follow-up. She is symptomatic with fatigue, SOB with exertion, occasional palpitations, dizziness and unsteadiness on her feet at times. Hgb today is down to 9.0 from 11.6 at her last visit.  Thankfully she has not had any falls or syncopal episodes.  She has also noted numbness and tingling in her fingertips.  She states that a week or so ago she had several days of dark tarry stools. This has since resolved and she had no other issues. She occasionally has some mild abdomina discomfort. She takes Tums as needed. She has not noted any other blood loss. No bruising or petechiae.  No fever, chills, n/v, cough, rash, chest pain or changes in bladder habits.  After her Injectafer infusion in November she had some flushing and aches in her legs for about 3 days after. She had no other issues.  No swelling or tenderness in her extremities.  She has maintained a good appetite and is staying well hydrated. Her weight is stable.   ECOG Performance Status: 1 - Symptomatic but completely ambulatory  Medications:  Allergies as of 11/30/2019      Reactions   Penicillins Anaphylaxis   Ciprocinonide [fluocinolone] Other (See Comments)   Unknown reaction   Sulfa Antibiotics Rash      Medication List       Accurate as of November 30, 2019  9:37 AM. If you have any questions, ask your nurse or doctor.        acetaminophen 500 MG tablet Commonly known as: TYLENOL Take 1,000 mg by mouth every 6 (six) hours as needed for moderate pain.   calcium carbonate 500 MG chewable tablet Commonly known as: TUMS - dosed in mg elemental calcium Chew 1 tablet by mouth 2 (two) times daily.   cloNIDine 0.2 MG tablet Commonly known as: Catapres Take 1 tablet (0.2 mg  total) by mouth 2 (two) times daily. What changed:   how much to take  when to take this  additional instructions   Vitamin D (Ergocalciferol) 1.25 MG (50000 UT) Caps capsule Commonly known as: DRISDOL Take 1 capsule (50,000 Units total) by mouth 2 (two) times a week. Started by: [name], NP       Allergies:  Allergies  Allergen Reactions  . Penicillins Anaphylaxis  . Ciprocinonide [Fluocinolone] Other (See Comments)    Unknown reaction  . Sulfa Antibiotics Rash    Past Medical History, Surgical history, Social history, and Family History were reviewed and updated.  Review of Systems: All other 10 point review of systems is negative.   Physical Exam:  height is 5\' 4"  (1.626 m) and weight is 178 lb (80.7 kg). Her temporal temperature is 97.4 F (36.3 C) (abnormal). Her blood pressure is 140/80 and her pulse is 71. Her respiration is 18 and oxygen saturation is 100%.   Wt Readings from Last 3 Encounters:  11/30/19 178 lb (80.7 kg)  10/26/19 176 lb 8 oz (80.1 kg)  08/10/19 172 lb 0.6 oz (78 kg)    Ocular: Sclerae unicteric, pupils equal, round and reactive to light Ear-nose-throat: Oropharynx clear, dentition fair Lymphatic: No cervical or supraclavicular adenopathy Lungs no rales or rhonchi, good excursion bilaterally Heart regular rate and rhythm, no murmur appreciated Abd soft, nontender, positive bowel  sounds, no liver or spleen tip palpated on exam, no fluid wave  MSK no focal spinal tenderness, no joint edema Neuro: non-focal, well-oriented, appropriate affect Breasts: Deferred   Lab Results  Component Value Date   WBC 4.2 11/30/2019   HGB 9.0 (L) 11/30/2019   HCT 29.4 (L) 11/30/2019   MCV 97.7 11/30/2019   PLT 330 11/30/2019   Lab Results  Component Value Date   FERRITIN 95 10/26/2019   IRON 55 10/26/2019   TIBC 283 10/26/2019   UIBC 228 10/26/2019   IRONPCTSAT 19 (L) 10/26/2019   Lab Results  Component Value Date   RETICCTPCT 5.2 (H)  10/26/2019   RBC 3.01 (L) 11/30/2019   RETICCTABS 139.2 12/12/2015   No results found for: KPAFRELGTCHN, LAMBDASER, KAPLAMBRATIO No results found for: Kandis Cocking, [facility] Lab Results  Component Value Date   TOTALPROTELP 6.8 02/28/2012   ALBUMINELP 57.8 02/28/2012   A1GS 4.0 02/28/2012   A2GS 11.7 02/28/2012   BETS 6.7 02/28/2012   BETA2SER 5.2 02/28/2012   GAMS 14.6 02/28/2012   MSPIKE NOT DET 02/28/2012   SPEI * 02/28/2012     Chemistry      Component Value Date/Time   NA 146 (H) 11/30/2019 0839   NA 148 (H) 12/19/2017 1355   NA 143 06/28/2017 1114   K 4.0 11/30/2019 0839   K 4.1 12/19/2017 1355   K 4.0 06/28/2017 1114   CL 112 (H) 11/30/2019 0839   CL 108 12/19/2017 1355   CO2 26 11/30/2019 0839   CO2 27 12/19/2017 1355   CO2 23 06/28/2017 1114   BUN 15 11/30/2019 0839   BUN 12 12/19/2017 1355   BUN 11.0 06/28/2017 1114   CREATININE 0.62 11/30/2019 0839   CREATININE 0.9 12/19/2017 1355   CREATININE 0.7 06/28/2017 1114      Component Value Date/Time   CALCIUM 9.1 11/30/2019 0839   CALCIUM 9.5 12/19/2017 1355   CALCIUM 9.5 06/28/2017 1114   ALKPHOS 69 11/30/2019 0839   ALKPHOS 88 (H) 12/19/2017 1355   ALKPHOS 88 06/28/2017 1114   AST 13 (L) 11/30/2019 0839   AST 16 06/28/2017 1114   ALT 11 11/30/2019 0839   ALT 16 12/19/2017 1355   ALT 13 06/28/2017 1114   BILITOT 0.3 11/30/2019 0839   BILITOT 0.30 06/28/2017 1114       Impression and Plan: Jamie Mcdowell is a very pleasant58 yo caucasian female withiron deficiency anemia.  She is symptomatic as mentioned above. We will give her IV iron today and again next week.  Referral also placed for GI here at [place]. This will be much easier for her.  Vit D refilled.  We will plan to see her back in another 4 weeks.  She will contact our office with any questions or concerns. We can certainly see her sooner if needed.    [name], NP 12/7/20209:37 AM

## 2019-11-30 NOTE — Patient Instructions (Signed)

## 2019-12-01 ENCOUNTER — Encounter: Payer: Self-pay | Admitting: Gastroenterology

## 2019-12-07 ENCOUNTER — Ambulatory Visit: Payer: Managed Care, Other (non HMO)

## 2019-12-11 ENCOUNTER — Other Ambulatory Visit: Payer: Self-pay

## 2019-12-11 ENCOUNTER — Inpatient Hospital Stay: Payer: Managed Care, Other (non HMO)

## 2019-12-11 VITALS — BP 154/79 | HR 62 | Resp 17 | Wt 179.0 lb

## 2019-12-11 DIAGNOSIS — D500 Iron deficiency anemia secondary to blood loss (chronic): Secondary | ICD-10-CM

## 2019-12-11 DIAGNOSIS — D509 Iron deficiency anemia, unspecified: Secondary | ICD-10-CM | POA: Diagnosis not present

## 2019-12-11 MED ORDER — SODIUM CHLORIDE 0.9 % IV SOLN
750.0000 mg | Freq: Once | INTRAVENOUS | Status: AC
  Administered 2019-12-11: 750 mg via INTRAVENOUS
  Filled 2019-12-11: qty 15

## 2019-12-11 MED ORDER — SODIUM CHLORIDE 0.9 % IV SOLN
Freq: Once | INTRAVENOUS | Status: AC
  Filled 2019-12-11: qty 250

## 2019-12-11 NOTE — Patient Instructions (Signed)

## 2019-12-30 ENCOUNTER — Encounter: Payer: Self-pay | Admitting: Gastroenterology

## 2019-12-30 ENCOUNTER — Other Ambulatory Visit: Payer: Self-pay

## 2019-12-30 ENCOUNTER — Ambulatory Visit (INDEPENDENT_AMBULATORY_CARE_PROVIDER_SITE_OTHER): Payer: Managed Care, Other (non HMO) | Admitting: Gastroenterology

## 2019-12-30 VITALS — BP 152/98 | HR 75 | Temp 98.5°F | Ht 64.0 in | Wt 179.0 lb

## 2019-12-30 DIAGNOSIS — K449 Diaphragmatic hernia without obstruction or gangrene: Secondary | ICD-10-CM

## 2019-12-30 DIAGNOSIS — K921 Melena: Secondary | ICD-10-CM | POA: Diagnosis not present

## 2019-12-30 DIAGNOSIS — D509 Iron deficiency anemia, unspecified: Secondary | ICD-10-CM

## 2019-12-30 DIAGNOSIS — K219 Gastro-esophageal reflux disease without esophagitis: Secondary | ICD-10-CM

## 2019-12-30 DIAGNOSIS — R194 Change in bowel habit: Secondary | ICD-10-CM

## 2019-12-30 DIAGNOSIS — Z1159 Encounter for screening for other viral diseases: Secondary | ICD-10-CM

## 2019-12-30 DIAGNOSIS — R1319 Other dysphagia: Secondary | ICD-10-CM

## 2019-12-30 DIAGNOSIS — R1310 Dysphagia, unspecified: Secondary | ICD-10-CM

## 2019-12-30 MED ORDER — CLENPIQ 10-3.5-12 MG-GM -GM/160ML PO SOLN: 1.0000 | Freq: Once | ORAL | 0 refills | Status: AC

## 2019-12-30 NOTE — Progress Notes (Signed)
Chief Complaint: Iron deficiency anemia, hematochezia, nausea  Referring Provider:     [name], NP   HPI:    Jamie Mcdowell is a 59 y.o. female referred to the Gastroenterology Clinic for evaluation of recurrent iron deficiency anemia/symptomatic anemia.  Longstanding history of IDA dating back to at least 2011 (ferritin 6,  Hgb 7.7).  Treated with RBC transfusion and IV iron upfront in 2011, and has followed closely in the Hematology clinic since that time.  Not responsive to oral iron therapy, so has required several IV iron infusions, and more recently requires IV iron every 4 weeks, with last infusion 11/2019. Had been responsive to iron therapy through 2019, but more recently baseline Hgb ~10-11.  Most recent labs in 11/2019: Ferritin 72, iron 38, TIBC 310, iron sat 12%, H/H 9/29.4 with MCV/RDW 97/15.  Normal liver enzymes.  Does c/o intermittent melena, occurring 1-2 times/month. Will have associated DOE and fatigue surrounding these events.  Will have nausea without emesis during these episodes.  Normal vitamin D recently, but in the context of Ergocalciferol 50,000 weekly.  Prior history of deficiency. Has ben taking Ergocalciferol for years.   Has been seen by Dr. Paulita Fujita at [place].  Last EGD was approximately 2017, and notable for hiatal hernia per patient.  Colonoscopy around that same time, but cannot recall results.  Has had VCE.  Request made for all of those records today.    Hx of reflux, characterized by HB and regurgitation. Worse at night. Avoids eating close to bedtime. +dysphagia, worse with rice, meats. Previously used Prilosec for years, but stopped after pelvic fracture in 2014. Takes Tums daily prophylactically, but no longer taking any PPI or H2 RA.  Does have long-standing diarrhea/constipation with previous diagnosis of IBS-mixed type. Not taking any meds or dietary mods. Sxs minimally bothersome for last number of years. Sxs also improved  since starting Clonidine years ago. Takes prn Colace when constipated.    No known family history of CRC, GI malignancy, liver disease, pancreatic disease, or IBD.    Past Medical History:  Diagnosis Date  . Anemia   . 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 History of blood transfusion 2019   due to pelvic bone being broken  . History of kidney stones   . Hypertension   . IBS (irritable bowel syndrome)   . Osteoporosis   . UTI (urinary tract infection)      Past Surgical History:  Procedure Laterality Date  . arm surgery Left    pen and plate inserted  . COLONOSCOPY     x3 done at [facility]  . ESOPHAGOGASTRODUODENOSCOPY     at [facility]. done the same time as the last Colonoscopy   Family History  Problem Relation Age of Onset  . Irritable bowel syndrome Father   . Clotting disorder Brother        Factor X deficiency   . Stroke Brother        multiple times x3  . Irritable bowel syndrome Brother   . Clotting disorder Daughter        factor X deficiency  . Colon cancer Neg Hx   . Esophageal cancer Neg Hx    Social History   Tobacco Use  . Smoking status: Former Smoker    Packs/day: 0.50    Years: 8.00    Pack years: 4.00    Types: Cigarettes  Start date: 01/19/1975    Quit date: 01/19/1983    Years since quitting: 36.9  . Smokeless tobacco: Never Used  . Tobacco comment: quit 31 years ago  Substance Use Topics  . Alcohol use: Yes    Alcohol/week: 1.0 standard drinks    Types: 1 Glasses of wine per week    Comment: on occasion  . Drug use: Never   Current Outpatient Medications  Medication Sig Dispense Refill  . acetaminophen (TYLENOL) 500 MG tablet Take 1,000 mg by mouth every 6 (six) hours as needed for moderate pain.    . calcium carbonate (TUMS - DOSED IN MG ELEMENTAL CALCIUM) 500 MG chewable tablet Chew 1 tablet by mouth 2 (two) times daily.    . cloNIDine (CATAPRES) 0.2 MG tablet Take 1 tablet (0.2 mg total) by mouth 2 (two) times daily.  (Patient taking differently: Take 0.1-0.2 mg by mouth See admin instructions. 0.1 mg in the am and 0.2 mg in the evening) 60 tablet 1  . Vitamin D, Ergocalciferol, (DRISDOL) 1.25 MG (50000 UT) CAPS capsule Take 1 capsule (50,000 Units total) by mouth 2 (two) times a week. 16 capsule 6   No current facility-administered medications for this visit.   Allergies  Allergen Reactions  . Penicillins Anaphylaxis  . Ciprocinonide [Fluocinolone] Other (See Comments)    Unknown reaction  . Sulfa Antibiotics Rash     Review of Systems: All systems reviewed and negative except where noted in HPI.     Physical Exam:    Wt Readings from Last 3 Encounters:  12/30/19 179 lb (81.2 kg)  12/11/19 179 lb (81.2 kg)  11/30/19 178 lb (80.7 kg)    BP (!) 152/98   Pulse 75   Temp 98.5 F (36.9 C)   Ht 5\' 4"  (1.626 m)   Wt 179 lb (81.2 kg)   BMI 30.73 kg/m  Constitutional:  Pleasant, in no acute distress. Psychiatric: Normal mood and affect. Behavior is normal. EENT: Pupils normal.  Conjunctivae are normal. No scleral icterus. Neck supple. No cervical LAD. Cardiovascular: Normal rate, regular rhythm. No edema Pulmonary/chest: Effort normal and breath sounds normal. No wheezing, rales or rhonchi. Abdominal: Soft, nondistended, nontender. Bowel sounds active throughout. There are no masses palpable. No hepatomegaly. Neurological: Alert and oriented to person place and time. Skin: Skin is warm and dry. No rashes noted.   ASSESSMENT AND PLAN;   1) Iron Deficiency Anemia 2) GERD 3) Hiatal hernia 4) Vitamin D deficiency 5) Melena 6) Alternating bowel habits 7) Dysphagia  -EGD with random and directed gastric and duodenal biopsies.  Evaluate for gastritis, PUD, malabsorptive disorder, etc. -EGD with esophageal dilation as indicated per findings -Evaluate for hiatal hernia size, along with Cameron's ulcer, AVMs, PUD, etc. at time of EGD -Colonoscopy with random and directed biopsies -Request  for records from [facility] -If EGD/colonoscopy unrevealing, low threshold for repeat VCE for obscure GI blood loss -Continue close follow-up in the Hematology clinic with IV iron infusions as scheduled -Continue dietary/lifestyle modifications for reflux management.  Avoiding retrial of PPI given good current control and not wanting to exacerbate iron deficiency  The indications, risks, and benefits of EGD and colonoscopy were explained to the patient in detail. Risks include but are not limited to bleeding, perforation, adverse reaction to medications, and cardiopulmonary compromise. Sequelae include but are not limited to the possibility of surgery, hositalization, and mortality. The patient verbalized understanding and wished to proceed. All questions answered, referred to scheduler and bowel prep ordered.  Further recommendations pending results of the exam.    Lavena Bullion, DO, FACG  12/30/2019, 2:49 PM   [place], Holli Humbles, NP

## 2019-12-30 NOTE — Patient Instructions (Signed)
You have been scheduled for an endoscopy and colonoscopy. Please follow the written instructions given to you at your visit today. Please pick up your prep supplies at the pharmacy within the next 1-3 days. If you use inhalers (even only as needed), please bring them with you on the day of your procedure. Your physician has requested that you go to www.[email or web address].com and enter the access code given to you at your visit today. This web site gives a general overview about your procedure. However, you should still follow specific instructions given to you by our office regarding your preparation for the procedure.  It was a pleasure to see you today!  Vito Cirigliano, D.O.

## 2019-12-31 ENCOUNTER — Ambulatory Visit: Payer: Managed Care, Other (non HMO)

## 2019-12-31 ENCOUNTER — Other Ambulatory Visit: Payer: Managed Care, Other (non HMO)

## 2019-12-31 ENCOUNTER — Ambulatory Visit: Payer: Managed Care, Other (non HMO) | Admitting: Family

## 2020-01-04 ENCOUNTER — Inpatient Hospital Stay: Payer: Managed Care, Other (non HMO) | Attending: Hematology & Oncology

## 2020-01-04 ENCOUNTER — Inpatient Hospital Stay (HOSPITAL_BASED_OUTPATIENT_CLINIC_OR_DEPARTMENT_OTHER): Payer: Managed Care, Other (non HMO) | Admitting: Family

## 2020-01-04 ENCOUNTER — Telehealth: Payer: Self-pay | Admitting: Family

## 2020-01-04 ENCOUNTER — Encounter: Payer: Self-pay | Admitting: Family

## 2020-01-04 ENCOUNTER — Inpatient Hospital Stay: Payer: Managed Care, Other (non HMO)

## 2020-01-04 ENCOUNTER — Telehealth: Payer: Self-pay | Admitting: Gastroenterology

## 2020-01-04 ENCOUNTER — Other Ambulatory Visit: Payer: Self-pay

## 2020-01-04 VITALS — BP 139/93 | HR 67 | Temp 97.8°F | Resp 18 | Ht 64.0 in | Wt 176.0 lb

## 2020-01-04 DIAGNOSIS — R5383 Other fatigue: Secondary | ICD-10-CM | POA: Diagnosis not present

## 2020-01-04 DIAGNOSIS — R0602 Shortness of breath: Secondary | ICD-10-CM | POA: Insufficient documentation

## 2020-01-04 DIAGNOSIS — R519 Headache, unspecified: Secondary | ICD-10-CM | POA: Diagnosis not present

## 2020-01-04 DIAGNOSIS — D500 Iron deficiency anemia secondary to blood loss (chronic): Secondary | ICD-10-CM

## 2020-01-04 DIAGNOSIS — D509 Iron deficiency anemia, unspecified: Secondary | ICD-10-CM | POA: Insufficient documentation

## 2020-01-04 DIAGNOSIS — R531 Weakness: Secondary | ICD-10-CM | POA: Insufficient documentation

## 2020-01-04 DIAGNOSIS — E559 Vitamin D deficiency, unspecified: Secondary | ICD-10-CM

## 2020-01-04 DIAGNOSIS — Z882 Allergy status to sulfonamides status: Secondary | ICD-10-CM | POA: Insufficient documentation

## 2020-01-04 LAB — CBC WITH DIFFERENTIAL (CANCER CENTER ONLY)
Abs Immature Granulocytes: 0.01 10*3/uL (ref 0.00–0.07)
Basophils Absolute: 0.1 10*3/uL (ref 0.0–0.1)
Basophils Relative: 2 %
Eosinophils Absolute: 0.1 10*3/uL (ref 0.0–0.5)
Eosinophils Relative: 3 %
HCT: 38.9 % (ref 36.0–46.0)
Hemoglobin: 11.9 g/dL — ABNORMAL LOW (ref 12.0–15.0)
Immature Granulocytes: 0 %
Lymphocytes Relative: 31 %
Lymphs Abs: 1 10*3/uL (ref 0.7–4.0)
MCH: 29.6 pg (ref 26.0–34.0)
MCHC: 30.6 g/dL (ref 30.0–36.0)
MCV: 96.8 fL (ref 80.0–100.0)
Monocytes Absolute: 0.2 10*3/uL (ref 0.1–1.0)
Monocytes Relative: 7 %
Neutro Abs: 1.9 10*3/uL (ref 1.7–7.7)
Neutrophils Relative %: 57 %
Platelet Count: 358 10*3/uL (ref 150–400)
RBC: 4.02 MIL/uL (ref 3.87–5.11)
RDW: 14.6 % (ref 11.5–15.5)
WBC Count: 3.3 10*3/uL — ABNORMAL LOW (ref 4.0–10.5)
nRBC: 0 % (ref 0.0–0.2)

## 2020-01-04 LAB — CMP (CANCER CENTER ONLY)
ALT: 12 U/L (ref 0–44)
AST: 12 U/L — ABNORMAL LOW (ref 15–41)
Albumin: 4.5 g/dL (ref 3.5–5.0)
Alkaline Phosphatase: 102 U/L (ref 38–126)
Anion gap: 7 (ref 5–15)
BUN: 16 mg/dL (ref 6–20)
CO2: 27 mmol/L (ref 22–32)
Calcium: 9.7 mg/dL (ref 8.9–10.3)
Chloride: 109 mmol/L (ref 98–111)
Creatinine: 0.7 mg/dL (ref 0.44–1.00)
GFR, Est AFR Am: >60 mL/min (ref >60)
GFR, Estimated: >60 mL/min (ref >60)
Glucose, Bld: 96 mg/dL (ref 70–99)
Potassium: 4.2 mmol/L (ref 3.5–5.1)
Sodium: 143 mmol/L (ref 135–145)
Total Bilirubin: 0.3 mg/dL (ref 0.3–1.2)
Total Protein: 6.8 g/dL (ref 6.5–8.1)

## 2020-01-04 LAB — RETICULOCYTES
Immature Retic Fract: 25.9 % — ABNORMAL HIGH (ref 2.3–15.9)
RBC.: 4 MIL/uL (ref 3.87–5.11)
Retic Count, Absolute: 182 10*3/uL (ref 19.0–186.0)
Retic Ct Pct: 4.6 % — ABNORMAL HIGH (ref 0.4–3.1)

## 2020-01-04 LAB — FERRITIN: Ferritin: 125 ng/mL (ref 11–307)

## 2020-01-04 LAB — IRON AND TIBC
Iron: 41 ug/dL (ref 41–142)
Saturation Ratios: 12 % — ABNORMAL LOW (ref 21–57)
TIBC: 343 ug/dL (ref 236–444)
UIBC: 302 ug/dL (ref 120–384)

## 2020-01-04 LAB — VITAMIN D 25 HYDROXY (VIT D DEFICIENCY, FRACTURES): Vit D, 25-Hydroxy: 55.46 ng/mL (ref 30–100)

## 2020-01-04 NOTE — Progress Notes (Signed)
Hematology and Oncology Follow Up Visit  Jamie Mcdowell [id number] 29-Dec-1960 59 y.o. 01/04/2020   Principle Diagnosis:  Recurrent iron deficiency anemia  Current Therapy:   IV iron as indicated   Interim History:  Jamie Mcdowell is here today for follow-up. She is symptomatic with fatigue, weakness, brain fog, SOB with exertion and headaches.  She notes blood in her stool at times and was able to see Dr. Bryan Lemma last week. She will be having and endoscopy and colonoscopy this Friday to assess for cause of blood loss.  No bruising or petechiae.  No fever, chills, n/v, cough, rash, chest pain, palpitations, abdominal pain or changes in bowel or bladder habits.  No swelling, tenderness, numbness or tingling in her extremities.  No falls or syncope.  She has maintained and good appetite and is staying well hydrated. Her weight is stable.   ECOG Performance Status: 1 - Symptomatic but completely ambulatory  Medications:  Allergies as of 01/04/2020      Reactions   Penicillins Anaphylaxis   Ciprocinonide [fluocinolone] Other (See Comments)   Unknown reaction   Sulfa Antibiotics Rash      Medication List       Accurate as of January 04, 2020  8:51 AM. If you have any questions, ask your nurse or doctor.        acetaminophen 500 MG tablet Commonly known as: TYLENOL Take 1,000 mg by mouth every 6 (six) hours as needed for moderate pain.   calcium carbonate 500 MG chewable tablet Commonly known as: TUMS - dosed in mg elemental calcium Chew 1 tablet by mouth 2 (two) times daily.   cloNIDine 0.2 MG tablet Commonly known as: Catapres Take 1 tablet (0.2 mg total) by mouth 2 (two) times daily. What changed:   how much to take  when to take this  additional instructions   Vitamin D (Ergocalciferol) 1.25 MG (50000 UT) Caps capsule Commonly known as: DRISDOL Take 1 capsule (50,000 Units total) by mouth 2 (two) times a week.       Allergies:  Allergies  Allergen Reactions    . Penicillins Anaphylaxis  . Ciprocinonide [Fluocinolone] Other (See Comments)    Unknown reaction  . Sulfa Antibiotics Rash    Past Medical History, Surgical history, Social history, and Family History were reviewed and updated.  Review of Systems: All other 10 point review of systems is negative.   Physical Exam:  vitals were not taken for this visit.   Wt Readings from Last 3 Encounters:  12/30/19 179 lb (81.2 kg)  12/11/19 179 lb (81.2 kg)  11/30/19 178 lb (80.7 kg)    Ocular: Sclerae unicteric, pupils equal, round and reactive to light Ear-nose-throat: Oropharynx clear, dentition fair Lymphatic: No cervical or supraclavicular adenopathy Lungs no rales or rhonchi, good excursion bilaterally Heart regular rate and rhythm, no murmur appreciated Abd soft, nontender, positive bowel sounds, no liver or spleen tip palpated on exam, no fluid wave  MSK no focal spinal tenderness, no joint edema Neuro: non-focal, well-oriented, appropriate affect Breasts: Deferred   Lab Results  Component Value Date   WBC 4.2 11/30/2019   HGB 9.0 (L) 11/30/2019   HCT 29.4 (L) 11/30/2019   MCV 97.7 11/30/2019   PLT 330 11/30/2019   Lab Results  Component Value Date   FERRITIN 72 11/30/2019   IRON 38 (L) 11/30/2019   TIBC 310 11/30/2019   UIBC 272 11/30/2019   IRONPCTSAT 12 (L) 11/30/2019   Lab Results  Component Value Date  RETICCTPCT 5.2 (H) 10/26/2019   RBC 3.01 (L) 11/30/2019   RETICCTABS 139.2 12/12/2015   No results found for: KPAFRELGTCHN, LAMBDASER, KAPLAMBRATIO No results found for: Kandis Cocking, [facility] Lab Results  Component Value Date   TOTALPROTELP 6.8 02/28/2012   ALBUMINELP 57.8 02/28/2012   A1GS 4.0 02/28/2012   A2GS 11.7 02/28/2012   BETS 6.7 02/28/2012   BETA2SER 5.2 02/28/2012   GAMS 14.6 02/28/2012   MSPIKE NOT DET 02/28/2012   SPEI * 02/28/2012     Chemistry      Component Value Date/Time   NA 146 (H) 11/30/2019 0839   NA 148 (H) 12/19/2017 1355    NA 143 06/28/2017 1114   K 4.0 11/30/2019 0839   K 4.1 12/19/2017 1355   K 4.0 06/28/2017 1114   CL 112 (H) 11/30/2019 0839   CL 108 12/19/2017 1355   CO2 26 11/30/2019 0839   CO2 27 12/19/2017 1355   CO2 23 06/28/2017 1114   BUN 15 11/30/2019 0839   BUN 12 12/19/2017 1355   BUN 11.0 06/28/2017 1114   CREATININE 0.62 11/30/2019 0839   CREATININE 0.9 12/19/2017 1355   CREATININE 0.7 06/28/2017 1114      Component Value Date/Time   CALCIUM 9.1 11/30/2019 0839   CALCIUM 9.5 12/19/2017 1355   CALCIUM 9.5 06/28/2017 1114   ALKPHOS 69 11/30/2019 0839   ALKPHOS 88 (H) 12/19/2017 1355   ALKPHOS 88 06/28/2017 1114   AST 13 (L) 11/30/2019 0839   AST 16 06/28/2017 1114   ALT 11 11/30/2019 0839   ALT 16 12/19/2017 1355   ALT 13 06/28/2017 1114   BILITOT 0.3 11/30/2019 0839   BILITOT 0.30 06/28/2017 1114       Impression and Plan: Jamie Mcdowell is a very pleasant58 yo caucasian female withiron deficiency anemia.She is symptomatic as mentioned above. Hgb is improved at 11.9, MCV 96.8.  She will have her endoscopy and colonoscopy Friday.  We will see what her iron studies show and replace if needed.  We will go ahead and plan to see her back in another 5 weeks.  She will contact our office with any questions or concerns. We can certainly see her sooner if needed.   [name], NP 1/11/20218:51 AM

## 2020-01-04 NOTE — Telephone Encounter (Signed)
Called and LMVM for patient with updated appointment schedule per 1/11 los

## 2020-01-05 ENCOUNTER — Telehealth: Payer: Self-pay | Admitting: Gastroenterology

## 2020-01-05 ENCOUNTER — Other Ambulatory Visit: Payer: Self-pay | Admitting: Gastroenterology

## 2020-01-05 ENCOUNTER — Ambulatory Visit (INDEPENDENT_AMBULATORY_CARE_PROVIDER_SITE_OTHER): Payer: Managed Care, Other (non HMO)

## 2020-01-05 DIAGNOSIS — Z1159 Encounter for screening for other viral diseases: Secondary | ICD-10-CM

## 2020-01-05 NOTE — Telephone Encounter (Signed)
Received records from [place] which were notable for the following:  Last seen by Dr. Paulita Fujita on 10/17/2016 for IDA, which has been intermittent since at least 2011.  Also with escalating reflux symptoms at that time, refractory to Prilosec 20 mg/day, along with intermittent solid food dysphagia.  Had recommended hiatal hernia surgery at that time.  Endoscopic Evaluation: -VCE (01/2017): Normal/no bleeding, but small bowel transit time 36 minutes. -Upper GI series (10/2016): Moderate sized hiatal hernia with 1/3-1/2 of the proximal stomach occupying the lower chest.  No obstruction of contrast flow with prompt gastric emptying.  Spontaneous reflux to the level of the thoracic inlet. -EGD (03/2016): Widely patent Schatzki's ring, large hiatal hernia -Colonoscopy (03/2016): External hemorrhoids, mild anal stenosis -EGD (2011): Medium size hiatal hernia, Cameron's erosions.  Duodenal biopsies negative for Celiac  No change to previous plan dated 12/30/2019.  Given rapid transit of VCE, if EGD/colonoscopy unrevealing, may consider repeat VCE to again rule out small bowel pathology.

## 2020-01-06 LAB — SARS CORONAVIRUS 2 (TAT 6-24 HRS): SARS Coronavirus 2: NEGATIVE

## 2020-01-08 ENCOUNTER — Ambulatory Visit (AMBULATORY_SURGERY_CENTER): Payer: Managed Care, Other (non HMO) | Admitting: Gastroenterology

## 2020-01-08 ENCOUNTER — Encounter: Payer: Self-pay | Admitting: Gastroenterology

## 2020-01-08 ENCOUNTER — Other Ambulatory Visit: Payer: Self-pay

## 2020-01-08 VITALS — BP 151/88 | HR 72 | Temp 98.2°F | Resp 24 | Ht 64.0 in | Wt 179.0 lb

## 2020-01-08 DIAGNOSIS — K317 Polyp of stomach and duodenum: Secondary | ICD-10-CM

## 2020-01-08 DIAGNOSIS — K5730 Diverticulosis of large intestine without perforation or abscess without bleeding: Secondary | ICD-10-CM

## 2020-01-08 DIAGNOSIS — K259 Gastric ulcer, unspecified as acute or chronic, without hemorrhage or perforation: Secondary | ICD-10-CM | POA: Diagnosis not present

## 2020-01-08 DIAGNOSIS — R197 Diarrhea, unspecified: Secondary | ICD-10-CM

## 2020-01-08 DIAGNOSIS — K2970 Gastritis, unspecified, without bleeding: Secondary | ICD-10-CM

## 2020-01-08 DIAGNOSIS — K2950 Unspecified chronic gastritis without bleeding: Secondary | ICD-10-CM

## 2020-01-08 DIAGNOSIS — K641 Second degree hemorrhoids: Secondary | ICD-10-CM | POA: Diagnosis not present

## 2020-01-08 DIAGNOSIS — K449 Diaphragmatic hernia without obstruction or gangrene: Secondary | ICD-10-CM | POA: Diagnosis not present

## 2020-01-08 DIAGNOSIS — D509 Iron deficiency anemia, unspecified: Secondary | ICD-10-CM

## 2020-01-08 DIAGNOSIS — D122 Benign neoplasm of ascending colon: Secondary | ICD-10-CM | POA: Diagnosis not present

## 2020-01-08 MED ORDER — SODIUM CHLORIDE 0.9 % IV SOLN
4.0000 mg | Freq: Once | INTRAVENOUS | Status: AC
  Administered 2020-01-08: 4 mg via INTRAVENOUS

## 2020-01-08 MED ORDER — PANTOPRAZOLE SODIUM 40 MG PO TBEC: 40.0000 mg | DELAYED_RELEASE_TABLET | Freq: Two times a day (BID) | ORAL | 1 refills | Status: DC

## 2020-01-08 MED ORDER — SUCRALFATE 1 GM/10ML PO SUSP: 1.0000 g | Freq: Two times a day (BID) | ORAL | 0 refills | Status: DC

## 2020-01-08 MED ORDER — SODIUM CHLORIDE 0.9 % IV SOLN: 500.0000 mL | Freq: Once | INTRAVENOUS | Status: DC

## 2020-01-08 NOTE — Op Note (Signed)
[facility] Patient Name: Jamie Mcdowell Procedure Date: 01/08/2020 2:35 PM MRN: [id number] Endoscopist: Gerrit Heck , MD Age: 59 Referring MD:  Date of Birth: [birth year] Gender: Female Account #: [id number] Procedure:                Colonoscopy Indications:              Clinically significant diarrhea of unexplained                            origin, Hematochezia, Iron deficiency anemia,                            Change in bowel habits, History of IBS mixed type Medicines:                Monitored Anesthesia Care Procedure:                Pre-Anesthesia Assessment:                           - Prior to the procedure, a History and Physical                            was performed, and patient medications and                            allergies were reviewed. The patient's tolerance of                            previous anesthesia was also reviewed. The risks                            and benefits of the procedure and the sedation                            options and risks were discussed with the patient.                            All questions were answered, and informed consent                            was obtained. Prior Anticoagulants: The patient has                            taken no previous anticoagulant or antiplatelet                            agents. ASA Grade Assessment: II - A patient with                            mild systemic disease. After reviewing the risks                            and benefits, the patient was deemed in  satisfactory condition to undergo the procedure.                           After obtaining informed consent, the colonoscope                            was passed under direct vision. Throughout the                            procedure, the patient's blood pressure, pulse, and                            oxygen saturations were monitored continuously. The                            Colonoscope was  introduced through the anus and                            advanced to the the cecum, identified by                            appendiceal orifice and ileocecal valve. The                            colonoscopy was performed without difficulty. The                            patient tolerated the procedure well. The quality                            of the bowel preparation was adequate. The                            ileocecal valve, appendiceal orifice, and rectum                            were photographed. Scope In: 2:50:14 PM Scope Out: 3:05:29 PM Scope Withdrawal Time: 0 hours 12 minutes 36 seconds  Total Procedure Duration: 0 hours 15 minutes 15 seconds  Findings:                 Hemorrhoids were found on perianal exam.                           A 5 mm polyp was found in the ascending colon. The                            polyp was sessile. The polyp was removed with a                            cold snare. Resection and retrieval were complete.                            Estimated blood loss was minimal.  Multiple small-mouthed diverticula were found in                            the sigmoid colon.                           Non-bleeding internal hemorrhoids were found during                            retroflexion. The hemorrhoids were small.                           The mucosa was otherwise normal appearing                            throughout the remainder of the colon. Biopsies for                            histology were taken with a cold forceps from the                            right colon and left colon for evaluation of                            microscopic colitis. Estimated blood loss was                            minimal. Complications:            No immediate complications. Estimated Blood Loss:     Estimated blood loss was minimal. Impression:               - Hemorrhoids found on perianal exam.                           - One 5 mm  polyp in the ascending colon, removed                            with a cold snare. Resected and retrieved.                           - Diverticulosis in the sigmoid colon.                           - Non-bleeding internal hemorrhoids.                           - Normal mucosa in the entire examined colon.                            Biopsied. Recommendation:           - Patient has a contact number available for                            emergencies. The signs and symptoms of potential  delayed complications were discussed with the                            patient. Return to normal activities tomorrow.                            Written discharge instructions were provided to the                            patient.                           - Resume previous diet.                           - Continue present medications.                           - Await pathology results.                           - Repeat colonoscopy for surveillance based on                            pathology results.                           - Return to GI office at appointment to be                            scheduled.                           - Use fiber, for example Citrucel, Fibercon, Konsyl                            or Metamucil.                           - Internal hemorrhoids were noted on this study and                            may be amenable to hemorrhoid band ligation. If you                            are interested in further treatment of these                            hemorrhoids with band ligation, please contact my                            clinic to set up an appointment for evaluation and                            treatment. Gerrit Heck, MD 01/08/2020 3:20:34 PM

## 2020-01-08 NOTE — Progress Notes (Signed)
Per Dr. Bryan Lemma give sucralfate suspension 1 gm po bid for 2 weeks no refill  156 ml bot.  Pantoprazole 40 mg #90 bid for 2 weeks refill x1.  RX sent to [facility]. [facility]

## 2020-01-08 NOTE — Progress Notes (Signed)
Called to room to assist during endoscopic procedure.  Patient ID and intended procedure confirmed with present staff. Received instructions for my participation in the procedure from the performing physician.  

## 2020-01-08 NOTE — Progress Notes (Signed)
Report to PACU, RN, vss, BBS= Clear.  

## 2020-01-08 NOTE — Op Note (Signed)
[facility] Patient Name: Jamie Mcdowell Procedure Date: 01/08/2020 2:35 PM MRN: [id number] Endoscopist: Gerrit Heck , MD Age: 59 Referring MD:  Date of Birth: 01-Sep-1961 Gender: Female Account #: [id number] Procedure:                Upper GI endoscopy Indications:              Iron deficiency anemia, Esophageal reflux,                            Hematochezia, Melena, Hiatal hernia Medicines:                Monitored Anesthesia Care Procedure:                Pre-Anesthesia Assessment:                           - Prior to the procedure, a History and Physical                            was performed, and patient medications and                            allergies were reviewed. The patient's tolerance of                            previous anesthesia was also reviewed. The risks                            and benefits of the procedure and the sedation                            options and risks were discussed with the patient.                            All questions were answered, and informed consent                            was obtained. Prior Anticoagulants: The patient has                            taken no previous anticoagulant or antiplatelet                            agents. ASA Grade Assessment: II - A patient with                            mild systemic disease. After reviewing the risks                            and benefits, the patient was deemed in                            satisfactory condition to undergo the procedure.  After obtaining informed consent, the endoscope was                            passed under direct vision. Throughout the                            procedure, the patient's blood pressure, pulse, and                            oxygen saturations were monitored continuously. The                            Endoscope was introduced through the mouth, and                            advanced to the second part  of duodenum. The upper                            GI endoscopy was accomplished without difficulty.                            The patient tolerated the procedure well. Scope In: Scope Out: Findings:                 The upper third of the esophagus and middle third                            of the esophagus were normal.                           The lower third of the esophagus was mildly                            tortuous due to crowding from the large hiatal                            hernia.                           A large 9 cm hiatal hernia with a few Cameron                            ulcers was found. The proximal extent of the                            gastric folds (end of tubular esophagus) was 34 cm                            from the incisors. The hiatal narrowing was 43 cm                            from the incisors. The Z-line was 34 cm from the  incisors. Z line was normal appearing. Biopsies                            were taken from the area of gastritis and Cameron's                            ulcers with a cold forceps for histology and H                            pylori testing. Estimated blood loss was minimal.                           Scattered mild inflammation characterized by                            erosions and erythema was found in the gastric                            fundus and in the gastric body, most pronounced                            around the diaphragmatic hiatus of the large                            hernia. Biopsies were taken with a cold forceps for                            Helicobacter pylori testing. Estimated blood loss                            was minimal.                           A few small sessile polyps with no bleeding and no                            stigmata of recent bleeding were found in the                            gastric body. A few of these polyps were removed                             with a cold biopsy forceps for histologic                            representative evaluation. Resection and retrieval                            were complete. Estimated blood loss was minimal.                           The pylorus was normal.  The duodenal bulb, first portion of the duodenum                            and second portion of the duodenum were normal.                            Biopsies for histology were taken with a cold                            forceps for evaluation of celiac disease. Estimated                            blood loss was minimal. Complications:            No immediate complications. Estimated Blood Loss:     Estimated blood loss was minimal. Impression:               - Normal upper third of esophagus and middle third                            of esophagus.                           - Tortuous esophagus.                           - Esophagogastric landmarks identified.                           - 9 cm hiatal hernia with a few Cameron ulcers.                            Biopsied.                           - Gastritis. Biopsied.                           - A few gastric polyps. Resected and retrieved.                           - Normal pylorus.                           - Normal duodenal bulb, first portion of the                            duodenum and second portion of the duodenum.                            Biopsied. Recommendation:           - Patient has a contact number available for                            emergencies. The signs and symptoms of potential  delayed complications were discussed with the                            patient. Return to normal activities tomorrow.                            Written discharge instructions were provided to the                            patient.                           - Resume previous diet.                           - Continue present  medications.                           - Await pathology results.                           - Use Protonix (pantoprazole) 40 mg PO BID for 6                            weeks.                           - Use sucralfate suspension 1 gram PO BID for 2                            weeks.                           - Return to GI clinic at appointment to be                            scheduled. Will disuss ongoing management options,                            to include medical management vs referral for                            hiatal hernia repair.                           - Colonoscopy today. Gerrit Heck, MD 01/08/2020 3:17:20 PM

## 2020-01-08 NOTE — Patient Instructions (Signed)
Discharge instructions given. Handouts on polyps,diverticulosis,hemorrhoids and hiatal hernia. Resume previous medications. Prescriptions sent to pharmacy. Use fiber for example Citrucel,Fibercon,Konsyl or Metamucil. Hemorrhoid banning also given to patient. YOU HAD AN ENDOSCOPIC PROCEDURE TODAY AT [facility]:   Refer to the procedure report that was given to you for any specific questions about what was found during the examination.  If the procedure report does not answer your questions, please call your gastroenterologist to clarify.  If you requested that your care partner not be given the details of your procedure findings, then the procedure report has been included in a sealed envelope for you to review at your convenience later.  YOU SHOULD EXPECT: Some feelings of bloating in the abdomen. Passage of more gas than usual.  Walking can help get rid of the air that was put into your GI tract during the procedure and reduce the bloating. If you had a lower endoscopy (such as a colonoscopy or flexible sigmoidoscopy) you may notice spotting of blood in your stool or on the toilet paper. If you underwent a bowel prep for your procedure, you may not have a normal bowel movement for a few days.  Please Note:  You might notice some irritation and congestion in your nose or some drainage.  This is from the oxygen used during your procedure.  There is no need for concern and it should clear up in a day or so.  SYMPTOMS TO REPORT IMMEDIATELY:   Following lower endoscopy (colonoscopy or flexible sigmoidoscopy):  Excessive amounts of blood in the stool  Significant tenderness or worsening of abdominal pains  Swelling of the abdomen that is new, acute  Fever of 100F or higher   Following upper endoscopy (EGD)  Vomiting of blood or coffee ground material  New chest pain or pain under the shoulder blades  Painful or persistently difficult swallowing  New shortness of breath  Fever  of 100F or higher  Black, tarry-looking stools  For urgent or emergent issues, a gastroenterologist can be reached at any hour by calling [phone].   DIET:  We do recommend a small meal at first, but then you may proceed to your regular diet.  Drink plenty of fluids but you should avoid alcoholic beverages for 24 hours.  ACTIVITY:  You should plan to take it easy for the rest of today and you should NOT DRIVE or use heavy machinery until tomorrow (because of the sedation medicines used during the test).    FOLLOW UP: Our staff will call the number listed on your records 48-72 hours following your procedure to check on you and address any questions or concerns that you may have regarding the information given to you following your procedure. If we do not reach you, we will leave a message.  We will attempt to reach you two times.  During this call, we will ask if you have developed any symptoms of COVID 19. If you develop any symptoms (ie: fever, flu-like symptoms, shortness of breath, cough etc.) before then, please call [phone].  If you test positive for Covid 19 in the 2 weeks post procedure, please call and report this information to [place].    If any biopsies were taken you will be contacted by phone or by letter within the next 1-3 weeks.  Please call us at [phone] if you have not heard about the biopsies in 3 weeks.    SIGNATURES/CONFIDENTIALITY: You and/or your care partner have signed paperwork which will  be entered into your electronic medical record.  These signatures attest to the fact that that the information above on your After Visit Summary has been reviewed and is understood.  Full responsibility of the confidentiality of this discharge information lies with you and/or your care-partner. 

## 2020-01-11 ENCOUNTER — Inpatient Hospital Stay: Payer: Managed Care, Other (non HMO)

## 2020-01-11 ENCOUNTER — Other Ambulatory Visit: Payer: Self-pay

## 2020-01-11 ENCOUNTER — Telehealth: Payer: Self-pay | Admitting: Hematology & Oncology

## 2020-01-11 VITALS — BP 154/84 | HR 56 | Temp 97.8°F | Resp 16

## 2020-01-11 DIAGNOSIS — D500 Iron deficiency anemia secondary to blood loss (chronic): Secondary | ICD-10-CM

## 2020-01-11 DIAGNOSIS — D509 Iron deficiency anemia, unspecified: Secondary | ICD-10-CM | POA: Diagnosis not present

## 2020-01-11 MED ORDER — SODIUM CHLORIDE 0.9 % IV SOLN
INTRAVENOUS | Status: DC
  Filled 2020-01-11: qty 250

## 2020-01-11 MED ORDER — SODIUM CHLORIDE 0.9 % IV SOLN
750.0000 mg | Freq: Once | INTRAVENOUS | Status: AC
  Administered 2020-01-11: 750 mg via INTRAVENOUS
  Filled 2020-01-11: qty 15

## 2020-01-11 NOTE — Telephone Encounter (Signed)
2nd Iron infusion was scheduled per 1/18 los. Calendar printed

## 2020-01-11 NOTE — Patient Instructions (Signed)
Anemia  Anemia is a condition in which you do not have enough red blood cells or hemoglobin. Hemoglobin is a substance in red blood cells that carries oxygen. When you do not have enough red blood cells or hemoglobin (are anemic), your body cannot get enough oxygen and your organs may not work properly. As a result, you may feel very tired or have other problems. What are the causes? Common causes of anemia include:  Excessive bleeding. Anemia can be caused by excessive bleeding inside or outside the body, including bleeding from the intestine or from periods in women.  Poor nutrition.  Long-lasting (chronic) kidney, thyroid, and liver disease.  Bone marrow disorders.  Cancer and treatments for cancer.  HIV (human immunodeficiency virus) and AIDS (acquired immunodeficiency syndrome).  Treatments for HIV and AIDS.  Spleen problems.  Blood disorders.  Infections, medicines, and autoimmune disorders that destroy red blood cells. What are the signs or symptoms? Symptoms of this condition include:  Minor weakness.  Dizziness.  Headache.  Feeling heartbeats that are irregular or faster than normal (palpitations).  Shortness of breath, especially with exercise.  Paleness.  Cold sensitivity.  Indigestion.  Nausea.  Difficulty sleeping.  Difficulty concentrating. Symptoms may occur suddenly or develop slowly. If your anemia is mild, you may not have symptoms. How is this diagnosed? This condition is diagnosed based on:  Blood tests.  Your medical history.  A physical exam.  Bone marrow biopsy. Your health care provider may also check your stool (feces) for blood and may do additional testing to look for the cause of your bleeding. You may also have other tests, including:  Imaging tests, such as a CT scan or MRI.  Endoscopy.  Colonoscopy. How is this treated? Treatment for this condition depends on the cause. If you continue to lose a lot of blood, you may  need to be treated at a hospital. Treatment may include:  Taking supplements of iron, vitamin S31, or folic acid.  Taking a hormone medicine (erythropoietin) that can help to stimulate red blood cell growth.  Having a blood transfusion. This may be needed if you lose a lot of blood.  Making changes to your diet.  Having surgery to remove your spleen. Follow these instructions at home:  Take over-the-counter and prescription medicines only as told by your health care provider.  Take supplements only as told by your health care provider.  Follow any diet instructions that you were given.  Keep all follow-up visits as told by your health care provider. This is important. Contact a health care provider if:  You develop new bleeding anywhere in the body. Get help right away if:  You are very weak.  You are short of breath.  You have pain in your abdomen or chest.  You are dizzy or feel faint.  You have trouble concentrating.  You have bloody or black, tarry stools.  You vomit repeatedly or you vomit up blood. Summary  Anemia is a condition in which you do not have enough red blood cells or enough of a substance in your red blood cells that carries oxygen (hemoglobin).  Symptoms may occur suddenly or develop slowly.  If your anemia is mild, you may not have symptoms.  This condition is diagnosed with blood tests as well as a medical history and physical exam. Other tests may be needed.  Treatment for this condition depends on the cause of the anemia. This information is not intended to replace advice given to you by  your health care provider. Make sure you discuss any questions you have with your health care provider. Document Revised: 11/22/2017 Document Reviewed: 01/11/2017 Elsevier Patient Education  [place].

## 2020-01-12 ENCOUNTER — Telehealth: Payer: Self-pay

## 2020-01-12 NOTE — Telephone Encounter (Signed)
NO ANSWER, MESSAGE LEFT FOR PATIENT. 

## 2020-01-12 NOTE — Telephone Encounter (Signed)
  Follow up Call-  Call back number 01/08/2020  Post procedure Call Back phone  # [phone]  Permission to leave phone message Yes  Some recent data might be hidden     Patient questions:  Do you have a fever, pain , or abdominal swelling? No. Pain Score  0 *  Have you tolerated food without any problems? Yes.    Have you been able to return to your normal activities? Yes.    Do you have any questions about your discharge instructions: Diet   No. Medications  No. Follow up visit  No.  Do you have questions or concerns about your Care? No.  Actions: * If pain score is 4 or above: 1. No action needed, pain <4.Have you developed a fever since your procedure? no  2.   Have you had an respiratory symptoms (SOB or cough) since your procedure? no  3.   Have you tested positive for COVID 19 since your procedure no  4.   Have you had any family members/close contacts diagnosed with the COVID 19 since your procedure?  no   If yes to any of these questions please route to Joylene John, RN and Alphonsa Gin, Therapist, sports.

## 2020-01-13 ENCOUNTER — Encounter: Payer: Self-pay | Admitting: Gastroenterology

## 2020-01-22 ENCOUNTER — Other Ambulatory Visit: Payer: Self-pay

## 2020-01-22 ENCOUNTER — Inpatient Hospital Stay: Payer: Managed Care, Other (non HMO)

## 2020-01-22 VITALS — BP 154/92 | HR 60 | Temp 97.5°F | Resp 14

## 2020-01-22 DIAGNOSIS — D500 Iron deficiency anemia secondary to blood loss (chronic): Secondary | ICD-10-CM

## 2020-01-22 DIAGNOSIS — D509 Iron deficiency anemia, unspecified: Secondary | ICD-10-CM | POA: Diagnosis not present

## 2020-01-22 MED ORDER — SODIUM CHLORIDE 0.9 % IV SOLN
750.0000 mg | Freq: Once | INTRAVENOUS | Status: AC
  Administered 2020-01-22: 750 mg via INTRAVENOUS
  Filled 2020-01-22: qty 15

## 2020-01-22 NOTE — Patient Instructions (Signed)

## 2020-02-08 ENCOUNTER — Inpatient Hospital Stay: Payer: Managed Care, Other (non HMO) | Attending: Hematology & Oncology

## 2020-02-08 ENCOUNTER — Inpatient Hospital Stay (HOSPITAL_BASED_OUTPATIENT_CLINIC_OR_DEPARTMENT_OTHER): Payer: Managed Care, Other (non HMO) | Admitting: Family

## 2020-02-08 ENCOUNTER — Encounter: Payer: Self-pay | Admitting: Family

## 2020-02-08 ENCOUNTER — Other Ambulatory Visit: Payer: Self-pay

## 2020-02-08 VITALS — BP 160/86 | HR 62 | Temp 97.5°F | Resp 18 | Ht 64.0 in | Wt 177.8 lb

## 2020-02-08 DIAGNOSIS — D509 Iron deficiency anemia, unspecified: Secondary | ICD-10-CM | POA: Insufficient documentation

## 2020-02-08 DIAGNOSIS — D500 Iron deficiency anemia secondary to blood loss (chronic): Secondary | ICD-10-CM

## 2020-02-08 LAB — CBC WITH DIFFERENTIAL (CANCER CENTER ONLY)
Abs Immature Granulocytes: 0.03 10*3/uL (ref 0.00–0.07)
Basophils Absolute: 0 10*3/uL (ref 0.0–0.1)
Basophils Relative: 1 %
Eosinophils Absolute: 0.1 10*3/uL (ref 0.0–0.5)
Eosinophils Relative: 4 %
HCT: 42.4 % (ref 36.0–46.0)
Hemoglobin: 13.5 g/dL (ref 12.0–15.0)
Immature Granulocytes: 1 %
Lymphocytes Relative: 29 %
Lymphs Abs: 1 10*3/uL (ref 0.7–4.0)
MCH: 29.5 pg (ref 26.0–34.0)
MCHC: 31.8 g/dL (ref 30.0–36.0)
MCV: 92.8 fL (ref 80.0–100.0)
Monocytes Absolute: 0.2 10*3/uL (ref 0.1–1.0)
Monocytes Relative: 7 %
Neutro Abs: 2.1 10*3/uL (ref 1.7–7.7)
Neutrophils Relative %: 58 %
Platelet Count: 247 10*3/uL (ref 150–400)
RBC: 4.57 MIL/uL (ref 3.87–5.11)
RDW: 14.5 % (ref 11.5–15.5)
WBC Count: 3.6 10*3/uL — ABNORMAL LOW (ref 4.0–10.5)
nRBC: 0 % (ref 0.0–0.2)

## 2020-02-08 LAB — RETICULOCYTES
Immature Retic Fract: 14.4 % (ref 2.3–15.9)
RBC.: 4.59 MIL/uL (ref 3.87–5.11)
Retic Count, Absolute: 148.7 10*3/uL (ref 19.0–186.0)
Retic Ct Pct: 3.2 % — ABNORMAL HIGH (ref 0.4–3.1)

## 2020-02-08 LAB — IRON AND TIBC
Iron: 63 ug/dL (ref 41–142)
Saturation Ratios: 23 % (ref 21–57)
TIBC: 270 ug/dL (ref 236–444)
UIBC: 207 ug/dL (ref 120–384)

## 2020-02-08 LAB — FERRITIN: Ferritin: 637 ng/mL — ABNORMAL HIGH (ref 11–307)

## 2020-02-08 NOTE — Progress Notes (Signed)
Hematology and Oncology Follow Up Visit  AQUARIUS STOKLEY [id number] [birth year] 59 y.o. 02/08/2020   Principle Diagnosis:  Recurrent iron deficiency anemia  Current Therapy:   IV iron as indicated   Interim History:  Ms. Slappey is here today for follow-up. She is feeling fatigued, SOB with exertion and has occasional lightheadedness.  No episodes of blood loss. No bruising or petechiae. Hgb is 13.5 and MCV 92.  She had her colonoscopy and endoscopy in January. These showed hiatal hernia with a few Cameron ulcers, gastritis, several benign polyps removed, internal and external hemorrhoids.  She started Protonix BID but is only taking once a day at bedtime now as it made her feel out of it.  She was sent to see Dr. Caron Presume with [facility] to discuss hernia repair. She is  Considering this and will get in touch with Dr. Marjory Sneddon team when she is ready to proceed.  No fever, chills, n/v, cough, rash, chest pain, palpitations, abdominal pain or changes in bowel or bladder habits.  No swelling, tenderness, numbness or tingling in her extremities.  No falls or syncope.  She has maintained a good appetite and is staying well hydrated. Her weight is stable.    ECOG Performance Status: 1 - Symptomatic but completely ambulatory  Medications:  Allergies as of 02/08/2020      Reactions   Penicillins Anaphylaxis   Ciprocinonide [fluocinolone] Other (See Comments)   Unknown reaction   Sulfa Antibiotics Rash      Medication List       Accurate as of February 08, 2020  9:14 AM. If you have any questions, ask your nurse or doctor.        acetaminophen 500 MG tablet Commonly known as: TYLENOL Take 1,000 mg by mouth every 6 (six) hours as needed for moderate pain.   calcium carbonate 500 MG chewable tablet Commonly known as: TUMS - dosed in mg elemental calcium Chew 1 tablet by mouth 2 (two) times daily.   cloNIDine 0.2 MG tablet Commonly known as: Catapres Take 1 tablet  (0.2 mg total) by mouth 2 (two) times daily. What changed:   how much to take  when to take this  additional instructions   pantoprazole 40 MG tablet Commonly known as: PROTONIX Take 1 tablet (40 mg total) by mouth 2 (two) times daily.   sucralfate 1 GM/10ML suspension Commonly known as: CARAFATE Take 10 mLs (1 g total) by mouth 2 (two) times daily.   Vitamin D (Ergocalciferol) 1.25 MG (50000 UNIT) Caps capsule Commonly known as: DRISDOL Take 1 capsule (50,000 Units total) by mouth 2 (two) times a week.       Allergies:  Allergies  Allergen Reactions  . Penicillins Anaphylaxis  . Ciprocinonide [Fluocinolone] Other (See Comments)    Unknown reaction  . Sulfa Antibiotics Rash    Past Medical History, Surgical history, Social history, and Family History were reviewed and updated.  Review of Systems: All other 10 point review of systems is negative.   Physical Exam:  vitals were not taken for this visit.   Wt Readings from Last 3 Encounters:  01/08/20 179 lb (81.2 kg)  01/04/20 176 lb 0.6 oz (79.9 kg)  12/30/19 179 lb (81.2 kg)    Ocular: Sclerae unicteric, pupils equal, round and reactive to light Ear-nose-throat: Oropharynx clear, dentition fair Lymphatic: No cervical or supraclavicular adenopathy Lungs no rales or rhonchi, good excursion bilaterally Heart regular rate and rhythm, no murmur appreciated Abd soft, nontender, positive  bowel sounds, no liver or spleen tip palpated on exam, no fluid wave  MSK no focal spinal tenderness, no joint edema Neuro: non-focal, well-oriented, appropriate affect Breasts: Deferred   Lab Results  Component Value Date   WBC 3.3 (L) 01/04/2020   HGB 11.9 (L) 01/04/2020   HCT 38.9 01/04/2020   MCV 96.8 01/04/2020   PLT 358 01/04/2020   Lab Results  Component Value Date   FERRITIN 125 01/04/2020   IRON 41 01/04/2020   TIBC 343 01/04/2020   UIBC 302 01/04/2020   IRONPCTSAT 12 (L) 01/04/2020   Lab Results  Component  Value Date   RETICCTPCT 4.6 (H) 01/04/2020   RBC 4.00 01/04/2020   RBC 4.02 01/04/2020   RETICCTABS 139.2 12/12/2015   No results found for: KPAFRELGTCHN, LAMBDASER, KAPLAMBRATIO No results found for: Kandis Cocking, IGMSERUM Lab Results  Component Value Date   TOTALPROTELP 6.8 02/28/2012   ALBUMINELP 57.8 02/28/2012   A1GS 4.0 02/28/2012   A2GS 11.7 02/28/2012   BETS 6.7 02/28/2012   BETA2SER 5.2 02/28/2012   GAMS 14.6 02/28/2012   MSPIKE NOT DET 02/28/2012   SPEI * 02/28/2012     Chemistry      Component Value Date/Time   NA 143 01/04/2020 0842   NA 148 (H) 12/19/2017 1355   NA 143 06/28/2017 1114   K 4.2 01/04/2020 0842   K 4.1 12/19/2017 1355   K 4.0 06/28/2017 1114   CL 109 01/04/2020 0842   CL 108 12/19/2017 1355   CO2 27 01/04/2020 0842   CO2 27 12/19/2017 1355   CO2 23 06/28/2017 1114   BUN 16 01/04/2020 0842   BUN 12 12/19/2017 1355   BUN 11.0 06/28/2017 1114   CREATININE 0.70 01/04/2020 0842   CREATININE 0.9 12/19/2017 1355   CREATININE 0.7 06/28/2017 1114      Component Value Date/Time   CALCIUM 9.7 01/04/2020 0842   CALCIUM 9.5 12/19/2017 1355   CALCIUM 9.5 06/28/2017 1114   ALKPHOS 102 01/04/2020 0842   ALKPHOS 88 (H) 12/19/2017 1355   ALKPHOS 88 06/28/2017 1114   AST 12 (L) 01/04/2020 0842   AST 16 06/28/2017 1114   ALT 12 01/04/2020 0842   ALT 16 12/19/2017 1355   ALT 13 06/28/2017 1114   BILITOT 0.3 01/04/2020 0842   BILITOT 0.30 06/28/2017 1114       Impression and Plan: Ms. Lupfer is a very pleasant59 yo caucasian female withiron deficiency anemia.She is symptomatic as mentioned above.   We will see what her iron studies show and bring her back in for infusion if needed.  We will see her back in another 6 weeks.  She will contact our office with any questions or concerns. We can certainly see her sooner if needed.   [name], NP 2/15/20219:14 AM

## 2020-02-12 ENCOUNTER — Ambulatory Visit (INDEPENDENT_AMBULATORY_CARE_PROVIDER_SITE_OTHER): Payer: Managed Care, Other (non HMO) | Admitting: Gastroenterology

## 2020-02-12 ENCOUNTER — Other Ambulatory Visit: Payer: Self-pay

## 2020-02-12 ENCOUNTER — Encounter: Payer: Self-pay | Admitting: Gastroenterology

## 2020-02-12 VITALS — BP 144/90 | HR 64 | Temp 97.3°F | Ht 64.0 in | Wt 178.4 lb

## 2020-02-12 DIAGNOSIS — K219 Gastro-esophageal reflux disease without esophagitis: Secondary | ICD-10-CM | POA: Diagnosis not present

## 2020-02-12 DIAGNOSIS — K259 Gastric ulcer, unspecified as acute or chronic, without hemorrhage or perforation: Secondary | ICD-10-CM

## 2020-02-12 DIAGNOSIS — K449 Diaphragmatic hernia without obstruction or gangrene: Secondary | ICD-10-CM

## 2020-02-12 DIAGNOSIS — Z86010 Personal history of colonic polyps: Secondary | ICD-10-CM

## 2020-02-12 DIAGNOSIS — D509 Iron deficiency anemia, unspecified: Secondary | ICD-10-CM | POA: Diagnosis not present

## 2020-02-12 NOTE — Progress Notes (Signed)
P  Chief Complaint:    Procedure follow-up, hiatal hernia, IDA  GI History: 59 year old female initially referred to the GI clinic for longstanding history of dating back to at least 2011 (ferritin 6,  Hgb 7.7).  Treated with RBC transfusion and IV iron upfront in 2011, and has followed closely in the Hematology clinic since that time.  Not responsive to oral iron therapy, so has required several IV iron infusions, and more recently requires IV iron every 3-4 weeks, with last infusion 12/29/. Had been responsive to iron therapy through 2019, but more recently baseline Hgb ~10-11.  Evaluated with EGD/colonoscopy in 12/2019, most notable for 9 cm HH with Cameron's ulcers.  Normal duodenal biopsies.  Referred to Dr. Kae Heller at [facility] for repair.  Hx of reflux, characterized by HB and regurgitation. Worse at night. Avoids eating close to bedtime. +dysphagia, worse with rice, meats.  More recently, reflux only occurs ~1/month.  Recently restarted Protonix 40 mg/day (intolerant to BID dosing-dizziness) due to Cameron's lesions.   Does have long-standing diarrhea/constipation with previous diagnosis of IBS-mixed type. Not taking any meds or dietary mods. Sxs minimally bothersome for last number of years. Sxs also improved since starting Clonidine years ago. Takes prn Colace when constipated.   Previous seen by Dr. Paulita Fujita at [facility] for [facility], last seen 10/17/16.  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   Endoscopic History: -EGD (12/2019, Dr. Bryan Lemma): Large 9 cm HH with Cameron's ulcers, mildly torturous lower esophagus, non-H. pylori gastritis, benign gastric hyperplastic polyps, normal duodenum (normal biopsies) -Colonoscopy (12/2019, Dr. Bryan Lemma): 5 mm TA, sigmoid diverticulosis, internal hemorrhoids.  Otherwise normal with biopsies negative for MC.  Repeat in 7 years  -VCE (01/2017, Dr. Paulita Fujita): Normal/no bleeding, but small bowel transit time 36 minutes. -Upper GI series (10/2016,  Dr. Paulita Fujita): Moderate sized hiatal hernia with 1/3-1/2 of the proximal stomach occupying the lower chest.  No obstruction of contrast flow with prompt gastric emptying.  Spontaneous reflux to the level of the thoracic inlet. -EGD (03/2016, Dr. Paulita Fujita): Widely patent Schatzki's ring, large hiatal hernia -Colonoscopy (03/2016, Dr. Paulita Fujita): External hemorrhoids, mild anal stenosis -EGD (2011, Dr. Paulita Fujita): Medium size hiatal hernia, Cameron's erosions.  Duodenal biopsies negative for Celiac  HPI:    Patient is a 59 y.o. female presenting to the Gastroenterology Clinic for follow-up.    Today, states she feels well. Last iron infusion was 01/22/20. Was seen by Dr. Kae Heller at [facility] with plan for robotic hernia repair with toupet fundoplication.  Otherwise, in her usual state of health.  She is hoping to delay surgery until after the school year is complete (early June surgical date).  Finished Carafate.  Decreased pantoprazole 40 mg bid to daily due to dizziness.  Does okay with daily dosing.  No reflux symptoms lately.  Since last appointment, repeat labs on 02/08/2020 reviewed: Normal H/H, ferritin 637, iron 63, TIBC 270, sat 23% demonstrating good response to therapy  Review of systems:     No chest pain, no SOB, no fevers, no urinary sx   Past Medical History:  Diagnosis Date  . Anemia   . Factor V deficiency ([place])   . History of blood transfusion 2019   due to pelvic bone being broken  . History of kidney stones   . Hypertension   . IBS (irritable bowel syndrome)   . Osteoporosis   . UTI (urinary tract infection)     Patient's surgical history, family medical history, social history, medications and allergies  were all reviewed in Epic    Current Outpatient Medications  Medication Sig Dispense Refill  . acetaminophen (TYLENOL) 500 MG tablet Take 1,000 mg by mouth every 6 (six) hours as needed for moderate pain.    . calcium carbonate (TUMS - DOSED IN MG ELEMENTAL CALCIUM) 500 MG chewable  tablet Chew 1 tablet by mouth 2 (two) times daily.    . cloNIDine (CATAPRES) 0.2 MG tablet Take 1 tablet (0.2 mg total) by mouth 2 (two) times daily. (Patient taking differently: Take 0.1-0.2 mg by mouth See admin instructions. 0.1 mg in the am and 0.2 mg in the evening) 60 tablet 1  . nitrofurantoin, macrocrystal-monohydrate, (MACROBID) 100 MG capsule Take 100 mg by mouth 2 (two) times daily.    . pantoprazole (PROTONIX) 40 MG tablet Take 1 tablet (40 mg total) by mouth 2 (two) times daily. (Patient taking differently: Take 40 mg by mouth at bedtime. ) 90 tablet 1  . Vitamin D, Ergocalciferol, (DRISDOL) 1.25 MG (50000 UT) CAPS capsule Take 1 capsule (50,000 Units total) by mouth 2 (two) times a week. 16 capsule 6   No current facility-administered medications for this visit.    Physical Exam:     BP (!) 144/90   Pulse 64   Temp (!) 97.3 F (36.3 C)   Ht 5\' 4"  (1.626 m)   Wt 178 lb 6 oz (80.9 kg)   BMI 30.62 kg/m   GENERAL:  Pleasant female in NAD PSYCH: : Cooperative, normal affect EENT:  conjunctiva pink, mucous membranes moist, neck supple without masses NEURO: Alert and oriented x 3, no focal neurologic deficits   IMPRESSION and PLAN:    1) Hiatal hernia 2) Cameron's ulcers 3) Iron Deficiency Anemia 4) GERD Very high suspicion that her IDA is due to chronic Cameron's ulcers, first noted 2011 on index EGD.  Large 9 cm hernia with Cameron's ulcers noted on EGD in 12/2019.  Otherwise, work-up has been unrevealing for separate GI pathology.  She has since been seen by Dr. Kae Heller at [facility] with plan for robotic hernia repair and Toupet fundoplication.  -Continue close follow-up in the Hematology clinic with iron infusions as appropriate -Continue pantoprazole 40 mg/day -Had a very long discussion today regarding the repair and fundoplication.  Discussed some types of fundoplication, to include Nissen vs partial/toupet.  Agree with her surgeon's plan, as they think this gives [place] the  best opportunity to resolve her IDA.  5) History of colon polyps -Colonoscopy in 12/2019 with single tubular adenoma -Repeat colonoscopy in 2028 for ongoing polyp surveillance  RTC in 6 months or sooner as needed  I spent 35 minutes of time, including in depth chart review, independent review of results as outlined above, communicating results with the patient directly, face-to-face time with the patient, coordinating care, and ordering studies and medications as appropriate, and documentation.           Lavena Bullion ,DO, FACG 02/12/2020, 11:41 AM

## 2020-02-12 NOTE — Patient Instructions (Signed)
Return in 6 months  It was a pleasure to see you today!  Vito Cirigliano, D.O.

## 2020-02-19 ENCOUNTER — Telehealth: Payer: Self-pay | Admitting: Gastroenterology

## 2020-02-19 NOTE — Telephone Encounter (Signed)
Called and spoke with patient-patient reports she is just wanting to let Dr. Bryan Lemma know that she is going to proceed with the surgery that [facility] has recommended however, she is wanting to ask more questions of Dr.Connor prior to the sx-she will follow up with [facility] for these questions;  Patient advised to call back to the office at [phone] should questions/concerns arise;  Patient verbalized understanding of information/instructions;

## 2020-02-19 NOTE — Telephone Encounter (Signed)
Pt would like to speak with you about a surgery for hiatal hernia that Dr. Bryan Lemma has recommended. Pls call her.

## 2020-03-01 ENCOUNTER — Other Ambulatory Visit: Payer: Self-pay | Admitting: *Deleted

## 2020-03-01 DIAGNOSIS — E876 Hypokalemia: Secondary | ICD-10-CM

## 2020-03-01 DIAGNOSIS — D500 Iron deficiency anemia secondary to blood loss (chronic): Secondary | ICD-10-CM

## 2020-03-02 ENCOUNTER — Other Ambulatory Visit: Payer: Self-pay

## 2020-03-02 ENCOUNTER — Inpatient Hospital Stay: Payer: Managed Care, Other (non HMO) | Attending: Hematology & Oncology

## 2020-03-02 DIAGNOSIS — D500 Iron deficiency anemia secondary to blood loss (chronic): Secondary | ICD-10-CM

## 2020-03-02 DIAGNOSIS — D509 Iron deficiency anemia, unspecified: Secondary | ICD-10-CM | POA: Insufficient documentation

## 2020-03-02 DIAGNOSIS — E876 Hypokalemia: Secondary | ICD-10-CM

## 2020-03-02 LAB — CMP (CANCER CENTER ONLY)
ALT: 14 U/L (ref 0–44)
AST: 13 U/L — ABNORMAL LOW (ref 15–41)
Albumin: 4.7 g/dL (ref 3.5–5.0)
Alkaline Phosphatase: 115 U/L (ref 38–126)
Anion gap: 7 (ref 5–15)
BUN: 19 mg/dL (ref 6–20)
CO2: 27 mmol/L (ref 22–32)
Calcium: 10 mg/dL (ref 8.9–10.3)
Chloride: 109 mmol/L (ref 98–111)
Creatinine: 0.67 mg/dL (ref 0.44–1.00)
GFR, Est AFR Am: >60 mL/min (ref >60)
GFR, Estimated: >60 mL/min (ref >60)
Glucose, Bld: 90 mg/dL (ref 70–99)
Potassium: 3.8 mmol/L (ref 3.5–5.1)
Sodium: 143 mmol/L (ref 135–145)
Total Bilirubin: 0.3 mg/dL (ref 0.3–1.2)
Total Protein: 7 g/dL (ref 6.5–8.1)

## 2020-03-02 LAB — CBC WITH DIFFERENTIAL (CANCER CENTER ONLY)
Abs Immature Granulocytes: 0.03 10*3/uL (ref 0.00–0.07)
Basophils Absolute: 0.1 10*3/uL (ref 0.0–0.1)
Basophils Relative: 1 %
Eosinophils Absolute: 0.1 10*3/uL (ref 0.0–0.5)
Eosinophils Relative: 2 %
HCT: 41.2 % (ref 36.0–46.0)
Hemoglobin: 13 g/dL (ref 12.0–15.0)
Immature Granulocytes: 1 %
Lymphocytes Relative: 27 %
Lymphs Abs: 1.3 10*3/uL (ref 0.7–4.0)
MCH: 28.9 pg (ref 26.0–34.0)
MCHC: 31.6 g/dL (ref 30.0–36.0)
MCV: 91.6 fL (ref 80.0–100.0)
Monocytes Absolute: 0.4 10*3/uL (ref 0.1–1.0)
Monocytes Relative: 8 %
Neutro Abs: 3 10*3/uL (ref 1.7–7.7)
Neutrophils Relative %: 61 %
Platelet Count: 296 10*3/uL (ref 150–400)
RBC: 4.5 MIL/uL (ref 3.87–5.11)
RDW: 14.4 % (ref 11.5–15.5)
WBC Count: 4.8 10*3/uL (ref 4.0–10.5)
nRBC: 0 % (ref 0.0–0.2)

## 2020-03-02 LAB — RETICULOCYTES
Immature Retic Fract: 15.1 % (ref 2.3–15.9)
RBC.: 4.43 MIL/uL (ref 3.87–5.11)
Retic Count, Absolute: 132 10*3/uL (ref 19.0–186.0)
Retic Ct Pct: 3 % (ref 0.4–3.1)

## 2020-03-03 LAB — IRON AND TIBC
Iron: 54 ug/dL (ref 41–142)
Saturation Ratios: 19 % — ABNORMAL LOW (ref 21–57)
TIBC: 290 ug/dL (ref 236–444)
UIBC: 236 ug/dL (ref 120–384)

## 2020-03-03 LAB — FERRITIN: Ferritin: 412 ng/mL — ABNORMAL HIGH (ref 11–307)

## 2020-03-04 ENCOUNTER — Ambulatory Visit: Payer: Self-pay | Admitting: Surgery

## 2020-03-04 ENCOUNTER — Inpatient Hospital Stay: Payer: Managed Care, Other (non HMO)

## 2020-03-04 ENCOUNTER — Other Ambulatory Visit: Payer: Self-pay

## 2020-03-04 VITALS — BP 152/77 | HR 56 | Temp 97.3°F | Resp 17

## 2020-03-04 DIAGNOSIS — D500 Iron deficiency anemia secondary to blood loss (chronic): Secondary | ICD-10-CM

## 2020-03-04 DIAGNOSIS — D509 Iron deficiency anemia, unspecified: Secondary | ICD-10-CM | POA: Diagnosis not present

## 2020-03-04 MED ORDER — SODIUM CHLORIDE 0.9 % IV SOLN
750.0000 mg | Freq: Once | INTRAVENOUS | Status: AC
  Administered 2020-03-04: 750 mg via INTRAVENOUS
  Filled 2020-03-04: qty 15

## 2020-03-04 MED ORDER — SODIUM CHLORIDE 0.9 % IV SOLN
INTRAVENOUS | Status: DC
  Filled 2020-03-04: qty 250

## 2020-03-04 NOTE — H&P (Signed)
Surgical H&P Requesting provider: Dr. Bryan Lemma  Chief Complaint: anemia  HPI: [name] referred by Dr. Bryan Lemma for a large paraesophageal hernia with Cameron's ulcers.  She is here today with her husband. She denies symptoms of reflux, postprandial pain or nausea.  However, for the last 9 years she has been suffering with iron deficiency anemia and his iron infusion dependent.  This seems to have worsened recently and she is now requiring iron infusions every 4 weeks.  She also has a history of vitamin D deficiency and takes 50,000 units twice a day as well as recent diagnosis of osteopenia.  She has had melanotic stools intermittently throughout this time and a positive fecal occult blood test.  She had a thorough endoscopic workup in 2017 (upper and lower endoscopy and video capsule endoscopy ) as well as an upper GI, and the only finding was a large paraesophageal hernia but there were no ulcerations at that time.  The hernia has been known since about 2011 at least.  She decided against hiatal hernia repair at that time given that it was asymptomatic and not clearly contributing to her iron deficiency anemia and she rightfully did not want to undergo a major procedure if it was not necessary. Given the increase in severity of her iron deficiency anemia recently, she had a repeat endoscopic workup 3 weeks ago and is now noted to have Cameron ulcers.  She has been started on high-dose PPI and Carafate to prevent mucosal healing.  Biopsies were negative for H. pylori or other significant abnormalities.  Still feels bloated from the colonoscopy prep. She follows up with Dr. Bryan Lemma next week. She notes a history of IBS type symptoms with alternating constipation and diarrhea.  Takes Colace frequently.  No prior abdominal surgical history.  She works as a Print production planner.  Denies any tobacco use since high school or significant alcohol/drug use.  Allergies   Allergen Reactions   Penicillins Anaphylaxis   Ciprocinonide [Fluocinolone] Other (See Comments)    Unknown reaction   Sulfa Antibiotics Rash    Past Medical History:  Diagnosis Date   Anemia    Factor V deficiency ([place])    History of blood transfusion 2019   due to pelvic bone being broken   History of kidney stones    Hypertension    IBS (irritable bowel syndrome)    Osteoporosis    UTI (urinary tract infection)     Past Surgical History:  Procedure Laterality Date   arm surgery Left    pen and plate inserted   COLONOSCOPY     x3 done at [facility]     at [facility]. done the same time as the last Colonoscopy    Family History  Problem Relation Age of Onset   Irritable bowel syndrome Father    Clotting disorder Brother        Factor V deficiency    Stroke Brother        multiple times x3   Irritable bowel syndrome Brother    Clotting disorder Daughter        factor V deficiency   Colon cancer Neg Hx    Esophageal cancer Neg Hx    Stomach cancer Neg Hx    Rectal cancer Neg Hx     Social History   Socioeconomic History   Marital status: Married    Spouse name: Not on file   Number of children: Not on file  Years of education: Not on file   Highest education level: Not on file  Occupational History   Not on file  Tobacco Use   Smoking status: Former Smoker    Packs/day: 0.50    Years: 8.00    Pack years: 4.00    Types: Cigarettes    Start date: 01/19/1975    Quit date: 01/19/1983    Years since quitting: 37.1   Smokeless tobacco: Never Used   Tobacco comment: quit 31 years ago  Substance and Sexual Activity   Alcohol use: Yes    Comment: rare   Drug use: Never   Sexual activity: Not on file  Other Topics Concern   Not on file  Social History Narrative   Not on file   Social Determinants of Health   Financial Resource Strain:    Difficulty of Paying Living Expenses:   Food Insecurity:    Worried  About Charity fundraiser in the Last Year:    Arboriculturist in the Last Year:   Transportation Needs:    Film/video editor (Medical):    Lack of Transportation (Non-Medical):   Physical Activity:    Days of Exercise per Week:    Minutes of Exercise per Session:   Stress:    Feeling of Stress :   Social Connections:    Frequency of Communication with Friends and Family:    Frequency of Social Gatherings with Friends and Family:    Attends Religious Services:    Active Member of Clubs or Organizations:    Attends Music therapist:    Marital Status:     Current Outpatient Medications on File Prior to Visit  Medication Sig Dispense Refill   acetaminophen (TYLENOL) 500 MG tablet Take 1,000 mg by mouth every 6 (six) hours as needed for moderate pain.     calcium carbonate (TUMS - DOSED IN MG ELEMENTAL CALCIUM) 500 MG chewable tablet Chew 1 tablet by mouth 2 (two) times daily.     cloNIDine (CATAPRES) 0.2 MG tablet Take 1 tablet (0.2 mg total) by mouth 2 (two) times daily. (Patient taking differently: Take 0.1-0.2 mg by mouth See admin instructions. 0.1 mg in the am and 0.2 mg in the evening) 60 tablet 1   nitrofurantoin, macrocrystal-monohydrate, (MACROBID) 100 MG capsule Take 100 mg by mouth 2 (two) times daily.     pantoprazole (PROTONIX) 40 MG tablet Take 1 tablet (40 mg total) by mouth 2 (two) times daily. (Patient taking differently: Take 40 mg by mouth at bedtime. ) 90 tablet 1   Vitamin D, Ergocalciferol, (DRISDOL) 1.25 MG (50000 UT) CAPS capsule Take 1 capsule (50,000 Units total) by mouth 2 (two) times a week. 16 capsule 6   No current facility-administered medications on file prior to visit.    Review of Systems: a complete, 10pt review of systems was completed with pertinent positives and negatives as documented in the HPI  Physical Exam: Vitals  Weight: 181.38 lb   Height: 64 in  Body Surface Area: 1.88 m   Body Mass Index: 31.13 kg/m   Temp.: 97.1  F    Pulse: 77 (Regular)     She is alert, well-appearing Unlabored respirations Abdomen is soft, nondistended, mildly tender in the right lower quadrant   CBC Latest Ref Rng & Units 03/02/2020 02/08/2020 01/04/2020  WBC 4.0 - 10.5 K/uL 4.8 3.6(L) 3.3(L)  Hemoglobin 12.0 - 15.0 g/dL 13.0 13.5 11.9(L)  Hematocrit 36.0 - 46.0 % 41.2 42.4  38.9  Platelets 150 - 400 K/uL 296 247 358    CMP Latest Ref Rng & Units 03/02/2020 01/04/2020 11/30/2019  Glucose 70 - 99 mg/dL 90 96 106(H)  BUN 6 - 20 mg/dL 19 16 15   Creatinine 0.44 - 1.00 mg/dL 0.67 0.70 0.62  Sodium 135 - 145 mmol/L 143 143 146(H)  Potassium 3.5 - 5.1 mmol/L 3.8 4.2 4.0  Chloride 98 - 111 mmol/L 109 109 112(H)  CO2 22 - 32 mmol/L 27 27 26   Calcium 8.9 - 10.3 mg/dL 10.0 9.7 9.1  Total Protein 6.5 - 8.1 g/dL 7.0 6.8 6.5  Total Bilirubin 0.3 - 1.2 mg/dL 0.3 0.3 0.3  Alkaline Phos 38 - 126 U/L 115 102 69  AST 15 - 41 U/L 13(L) 12(L) 13(L)  ALT 0 - 44 U/L 14 12 11     No results found for: INR, PROTIME  Imaging: No results found.   A/P: PARAESOPHAGEAL HERNIA (K44.9) Story: In the setting of symptomatic anemia and iron infusion dependence and finding of Cameron's ulcers, discussed that repair is indicated. This is done robotically or laparoscopically. Discussed typically would do a partial/toupet fundoplication. We had a long discussion and went over the relevant anatomy, details of the surgical technique, and risks of surgery using a diagram to demonstrate. Discussed the typical pre-, peri-and postoperative course. Discussed risks of surgery including bleeding, infection, pain, scarring, injury to intra-abdominal or mediastinal structures, hernia recurrence, dysphagia, gas bloat, failure to resolve symptoms, as well as general risks including cardiovascular, thromboembolic and pulmonary complications all of which are rare. Questions were answered. 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 Advised that our OR schedule is not available more than 3 months out so I requested her to please call us in March so that we can move forward with scheduling if that is what she wishes to do.  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 Marin Olp IRRITABLE BOWEL SYNDROME (K58.9) VITAMIN D DEFICIENCY (E55.9) Story: She has had this for many years as well, it seems as though she has an absorption issue contributing to her iron deficiency, vitamin D deficiency, osteopenia, etc. these are all absorbed in the same area OSTEOPENIA (M85.80) HYPERTENSION (I10)  Patient Active Problem List   Diagnosis Date Noted   Closed fracture of multiple pubic rami, left, initial encounter ([place]) 06/05/2018   Closed fracture of phalanx of left second toe 06/05/2018   BMI 30.0-30.9,adult 06/05/2018   Hypokalemia 06/05/2018   Acute urinary retention 06/05/2018   HTN (hypertension) 03/12/2013   Iron deficiency anemia 10/20/2011       Romana Juniper, MD [facility]  See AMION to contact appropriate on-call provider

## 2020-03-14 ENCOUNTER — Other Ambulatory Visit: Payer: Self-pay | Admitting: Family Medicine

## 2020-03-14 ENCOUNTER — Other Ambulatory Visit (HOSPITAL_COMMUNITY)
Admission: RE | Admit: 2020-03-14 | Discharge: 2020-03-14 | Disposition: A | Discharge status: Home / Self Care | Payer: Managed Care, Other (non HMO) | Source: Ambulatory Visit | Attending: Family Medicine | Admitting: Family Medicine

## 2020-03-14 DIAGNOSIS — Z124 Encounter for screening for malignant neoplasm of cervix: Secondary | ICD-10-CM | POA: Diagnosis not present

## 2020-03-16 LAB — CYTOLOGY - PAP
Comment: NEGATIVE
Diagnosis: NEGATIVE
High risk HPV: NEGATIVE

## 2020-03-21 ENCOUNTER — Other Ambulatory Visit: Payer: Managed Care, Other (non HMO)

## 2020-03-21 ENCOUNTER — Ambulatory Visit: Payer: Managed Care, Other (non HMO) | Admitting: Family

## 2020-04-01 ENCOUNTER — Other Ambulatory Visit: Payer: Self-pay

## 2020-04-01 ENCOUNTER — Inpatient Hospital Stay (HOSPITAL_BASED_OUTPATIENT_CLINIC_OR_DEPARTMENT_OTHER): Payer: Managed Care, Other (non HMO) | Admitting: Family

## 2020-04-01 ENCOUNTER — Inpatient Hospital Stay: Payer: Managed Care, Other (non HMO) | Attending: Hematology & Oncology

## 2020-04-01 ENCOUNTER — Encounter: Payer: Self-pay | Admitting: Family

## 2020-04-01 VITALS — BP 153/85 | HR 75 | Temp 97.8°F | Resp 18 | Ht 64.0 in | Wt 176.0 lb

## 2020-04-01 DIAGNOSIS — D500 Iron deficiency anemia secondary to blood loss (chronic): Secondary | ICD-10-CM | POA: Diagnosis not present

## 2020-04-01 DIAGNOSIS — D509 Iron deficiency anemia, unspecified: Secondary | ICD-10-CM | POA: Diagnosis not present

## 2020-04-01 LAB — IRON AND TIBC
Iron: 69 ug/dL (ref 41–142)
Saturation Ratios: 27 % (ref 21–57)
TIBC: 254 ug/dL (ref 236–444)
UIBC: 185 ug/dL (ref 120–384)

## 2020-04-01 LAB — CBC WITH DIFFERENTIAL (CANCER CENTER ONLY)
Abs Immature Granulocytes: 0.05 10*3/uL (ref 0.00–0.07)
Basophils Absolute: 0.1 10*3/uL (ref 0.0–0.1)
Basophils Relative: 1 %
Eosinophils Absolute: 0.1 10*3/uL (ref 0.0–0.5)
Eosinophils Relative: 3 %
HCT: 39.8 % (ref 36.0–46.0)
Hemoglobin: 12.7 g/dL (ref 12.0–15.0)
Immature Granulocytes: 1 %
Lymphocytes Relative: 28 %
Lymphs Abs: 1 10*3/uL (ref 0.7–4.0)
MCH: 29.3 pg (ref 26.0–34.0)
MCHC: 31.9 g/dL (ref 30.0–36.0)
MCV: 91.9 fL (ref 80.0–100.0)
Monocytes Absolute: 0.2 10*3/uL (ref 0.1–1.0)
Monocytes Relative: 6 %
Neutro Abs: 2.2 10*3/uL (ref 1.7–7.7)
Neutrophils Relative %: 61 %
Platelet Count: 303 10*3/uL (ref 150–400)
RBC: 4.33 MIL/uL (ref 3.87–5.11)
RDW: 15.2 % (ref 11.5–15.5)
WBC Count: 3.7 10*3/uL — ABNORMAL LOW (ref 4.0–10.5)
nRBC: 0 % (ref 0.0–0.2)

## 2020-04-01 LAB — RETICULOCYTES
Immature Retic Fract: 21.3 % — ABNORMAL HIGH (ref 2.3–15.9)
RBC.: 4.34 MIL/uL (ref 3.87–5.11)
Retic Count, Absolute: 166.2 10*3/uL (ref 19.0–186.0)
Retic Ct Pct: 3.8 % — ABNORMAL HIGH (ref 0.4–3.1)

## 2020-04-01 LAB — FERRITIN: Ferritin: 614 ng/mL — ABNORMAL HIGH (ref 11–307)

## 2020-04-01 NOTE — Progress Notes (Signed)
Hematology and Oncology Follow Up Visit  Jamie Mcdowell [id number] 1961/03/12 59 y.o. 04/01/2020   Principle Diagnosis:  Recurrent iron deficiency anemia  Current Therapy:        IV iron as indicated   Interim History:  Jamie Mcdowell is here today for follow-up. She is scheduled for her hernia repair on May 26th with Dr. Windle Guard and Dr. Jerrell Belfast.  Hgb is stable at 12.7, MCV 91.  She has not noted any obvious blood loss. No bruising or petechiae.  She has had some fatigue, SOB with exertion and lightheadedness. Iron studies are pending.  No fever, chills, n/v, cough, rash, chest pain, abdominal pain or changes in bowel or bladder habits.  No swelling  in her extremities.  She has had some soreness in her right hip and leg when walking at times since doing a lot of walking while away last weekend.  She has occasional numbness and tingling in the left arm and hand.  No falls or syncope.  She has a good appetite and is staying well hydrated. Her weight is stable.   ECOG Performance Status: 1 - Symptomatic but completely ambulatory  Medications:  Allergies as of 04/01/2020      Reactions   Penicillins Anaphylaxis   Ciprocinonide [fluocinolone] Other (See Comments)   Unknown reaction   Sulfa Antibiotics Rash      Medication List       Accurate as of April 01, 2020  9:35 AM. If you have any questions, ask your nurse or doctor.        acetaminophen 500 MG tablet Commonly known as: TYLENOL Take 1,000 mg by mouth every 6 (six) hours as needed for moderate pain.   calcium carbonate 500 MG chewable tablet Commonly known as: TUMS - dosed in mg elemental calcium Chew 1 tablet by mouth 2 (two) times daily.   cloNIDine 0.2 MG tablet Commonly known as: Catapres Take 1 tablet (0.2 mg total) by mouth 2 (two) times daily. What changed:   how much to take  when to take this  additional instructions   nitrofurantoin (macrocrystal-monohydrate) 100 MG capsule Commonly known as:  MACROBID Take 100 mg by mouth 2 (two) times daily.   pantoprazole 40 MG tablet Commonly known as: PROTONIX Take 1 tablet (40 mg total) by mouth 2 (two) times daily. What changed: when to take this   Vitamin D (Ergocalciferol) 1.25 MG (50000 UNIT) Caps capsule Commonly known as: DRISDOL Take 1 capsule (50,000 Units total) by mouth 2 (two) times a week.       Allergies:  Allergies  Allergen Reactions  . Penicillins Anaphylaxis  . Ciprocinonide [Fluocinolone] Other (See Comments)    Unknown reaction  . Sulfa Antibiotics Rash    Past Medical History, Surgical history, Social history, and Family History were reviewed and updated.  Review of Systems: All other 10 point review of systems is negative.   Physical Exam:  height is 5\' 4"  (1.626 m) and weight is 176 lb (79.8 kg). Her temporal temperature is 97.8 F (36.6 C). Her blood pressure is 153/85 (abnormal) and her pulse is 75. Her respiration is 18 and oxygen saturation is 100%.   Wt Readings from Last 3 Encounters:  04/01/20 176 lb (79.8 kg)  02/12/20 178 lb 6 oz (80.9 kg)  02/08/20 177 lb 12.8 oz (80.6 kg)    Ocular: Sclerae unicteric, pupils equal, round and reactive to light Ear-nose-throat: Oropharynx clear, dentition fair Lymphatic: No cervical or supraclavicular adenopathy Lungs no rales or  rhonchi, good excursion bilaterally Heart regular rate and rhythm, no murmur appreciated Abd soft, nontender, positive bowel sounds, no liver or spleen tip palpated on exam, no fluid wave  MSK no focal spinal tenderness, no joint edema Neuro: non-focal, well-oriented, appropriate affect Breasts: Deferred   Lab Results  Component Value Date   WBC 4.8 03/02/2020   HGB 13.0 03/02/2020   HCT 41.2 03/02/2020   MCV 91.6 03/02/2020   PLT 296 03/02/2020   Lab Results  Component Value Date   FERRITIN 412 (H) 03/02/2020   IRON 54 03/02/2020   TIBC 290 03/02/2020   UIBC 236 03/02/2020   IRONPCTSAT 19 (L) 03/02/2020   Lab  Results  Component Value Date   RETICCTPCT 3.0 03/02/2020   RBC 4.50 03/02/2020   RBC 4.43 03/02/2020   RETICCTABS 139.2 12/12/2015   No results found for: KPAFRELGTCHN, LAMBDASER, KAPLAMBRATIO No results found for: Kandis Cocking, [facility] Lab Results  Component Value Date   TOTALPROTELP 6.8 02/28/2012   ALBUMINELP 57.8 02/28/2012   A1GS 4.0 02/28/2012   A2GS 11.7 02/28/2012   BETS 6.7 02/28/2012   BETA2SER 5.2 02/28/2012   GAMS 14.6 02/28/2012   MSPIKE NOT DET 02/28/2012   SPEI * 02/28/2012     Chemistry      Component Value Date/Time   NA 143 03/02/2020 1423   NA 148 (H) 12/19/2017 1355   NA 143 06/28/2017 1114   K 3.8 03/02/2020 1423   K 4.1 12/19/2017 1355   K 4.0 06/28/2017 1114   CL 109 03/02/2020 1423   CL 108 12/19/2017 1355   CO2 27 03/02/2020 1423   CO2 27 12/19/2017 1355   CO2 23 06/28/2017 1114   BUN 19 03/02/2020 1423   BUN 12 12/19/2017 1355   BUN 11.0 06/28/2017 1114   CREATININE 0.67 03/02/2020 1423   CREATININE 0.9 12/19/2017 1355   CREATININE 0.7 06/28/2017 1114      Component Value Date/Time   CALCIUM 10.0 03/02/2020 1423   CALCIUM 9.5 12/19/2017 1355   CALCIUM 9.5 06/28/2017 1114   ALKPHOS 115 03/02/2020 1423   ALKPHOS 88 (H) 12/19/2017 1355   ALKPHOS 88 06/28/2017 1114   AST 13 (L) 03/02/2020 1423   AST 16 06/28/2017 1114   ALT 14 03/02/2020 1423   ALT 16 12/19/2017 1355   ALT 13 06/28/2017 1114   BILITOT 0.3 03/02/2020 1423   BILITOT 0.30 06/28/2017 1114       Impression and Plan: [name] is a very pleasant50 yo caucasian female withiron deficiency anemia. We will see what her iron studies show and replace if needed.  We will see her back in another 2 months She will contact our office with any questions or concerns. We can certainly see her sooner if needed.   [name], NP 4/9/20219:35 AM

## 2020-05-04 NOTE — Patient Instructions (Addendum)
DUE TO COVID-19 ONLY ONE VISITOR IS ALLOWED TO COME WITH YOU AND STAY IN THE WAITING ROOM ONLY DURING PRE OP AND PROCEDURE DAY OF SURGERY. THE 1 VISITOR MAY VISIT WITH YOU AFTER SURGERY IN YOUR PRIVATE ROOM DURING VISITING HOURS ONLY!  YOU NEED TO HAVE A COVID 19 TEST ON 05-14-20 @ 11:00 AM. THIS TEST MUST BE DONE BEFORE SURGERY, COME  [address], [place] , [address].  ([place]) ONCE YOUR COVID TEST IS COMPLETED, PLEASE BEGIN THE QUARANTINE INSTRUCTIONS AS OUTLINED IN YOUR HANDOUT.                Jamie Mcdowell  05/04/2020   Your procedure is scheduled on: 05-18-20   Report to [facility] Main  Entrance    Report to Admitting at 6:30 AM     Call this number if you have problems the morning of surgery [phone]    Remember: Do not eat food or drink liquids :After Midnight.     Take these medicines the morning of surgery with A SIP OF WATER: Clonidine (Catapres)  BRUSH YOUR TEETH MORNING OF SURGERY AND RINSE YOUR MOUTH OUT, NO CHEWING GUM CANDY OR MINTS.                                You may not have any metal on your body including hair pins and              piercings     Do not wear jewelry, make-up, lotions, powders or perfumes, deodorant              Do not wear nail polish on your fingernails.  Do not shave  48 hours prior to surgery.                Do not bring valuables to the hospital. [place].  Contacts, dentures or bridgework may not be worn into surgery.  You may bring a small overnight bag   Special Instructions: N/A              Please read over the following fact sheets you were given: _____________________________________________________________________             [facility] - Preparing for Surgery Before surgery, you can play an important role.  Because skin is not sterile, your skin needs to be as free of germs as possible.  You can reduce the number of germs on your  skin by washing with CHG (chlorahexidine gluconate) soap before surgery.  CHG is an antiseptic cleaner which kills germs and bonds with the skin to continue killing germs even after washing. Please DO NOT use if you have an allergy to CHG or antibacterial soaps.  If your skin becomes reddened/irritated stop using the CHG and inform your nurse when you arrive at Short Stay. Do not shave (including legs and underarms) for at least 48 hours prior to the first CHG shower.  You may shave your face/neck. Please follow these instructions carefully:  1.  Shower with CHG Soap the night before surgery and the  morning of Surgery.  2.  If you choose to wash your hair, wash your hair first as usual with your  normal  shampoo.  3.  After you shampoo, rinse your hair and body thoroughly  to remove the  shampoo.                           4.  Use CHG as you would any other liquid soap.  You can apply chg directly  to the skin and wash                       Gently with a scrungie or clean washcloth.  5.  Apply the CHG Soap to your body ONLY FROM THE NECK DOWN.   Do not use on face/ open                           Wound or open sores. Avoid contact with eyes, ears mouth and genitals (private parts).                       Wash face,  Genitals (private parts) with your normal soap.             6.  Wash thoroughly, paying special attention to the area where your surgery  will be performed.  7.  Thoroughly rinse your body with warm water from the neck down.  8.  DO NOT shower/wash with your normal soap after using and rinsing off  the CHG Soap.                9.  Pat yourself dry with a clean towel.            10.  Wear clean pajamas.            11.  Place clean sheets on your bed the night of your first shower and do not  sleep with pets. Day of Surgery : Do not apply any lotions/deodorants the morning of surgery.  Please wear clean clothes to the hospital/surgery center.  FAILURE TO FOLLOW THESE INSTRUCTIONS MAY  RESULT IN THE CANCELLATION OF YOUR SURGERY PATIENT SIGNATURE_________________________________  NURSE SIGNATURE__________________________________  ________________________________________________________________________

## 2020-05-04 NOTE — Progress Notes (Signed)
PCP - Carol Ada, MD Cardiologist -   Chest x-ray -  EKG -  Stress Test -  ECHO -  Cardiac Cath -   Sleep Study -  CPAP -   Fasting Blood Sugar -  Checks Blood Sugar _____ times a day  Blood Thinner Instructions: Aspirin Instructions: Last Dose:  Anesthesia review: Factor V, HTN  Patient denies shortness of breath, fever, cough and chest pain at PAT appointment   Patient verbalized understanding of instructions that were given to them at the PAT appointment. Patient was also instructed that they will need to review over the PAT instructions again at home before surgery.

## 2020-05-06 ENCOUNTER — Encounter: Payer: Self-pay | Admitting: Family

## 2020-05-06 ENCOUNTER — Other Ambulatory Visit: Payer: Self-pay

## 2020-05-06 ENCOUNTER — Inpatient Hospital Stay (HOSPITAL_BASED_OUTPATIENT_CLINIC_OR_DEPARTMENT_OTHER): Payer: Managed Care, Other (non HMO) | Admitting: Family

## 2020-05-06 ENCOUNTER — Inpatient Hospital Stay: Payer: Managed Care, Other (non HMO) | Attending: Hematology & Oncology

## 2020-05-06 VITALS — BP 159/93 | HR 64 | Temp 97.3°F | Resp 18 | Ht 64.0 in | Wt 178.4 lb

## 2020-05-06 DIAGNOSIS — D509 Iron deficiency anemia, unspecified: Secondary | ICD-10-CM | POA: Diagnosis present

## 2020-05-06 DIAGNOSIS — D500 Iron deficiency anemia secondary to blood loss (chronic): Secondary | ICD-10-CM

## 2020-05-06 DIAGNOSIS — K449 Diaphragmatic hernia without obstruction or gangrene: Secondary | ICD-10-CM | POA: Diagnosis not present

## 2020-05-06 LAB — CBC WITH DIFFERENTIAL (CANCER CENTER ONLY)
Abs Immature Granulocytes: 0.03 10*3/uL (ref 0.00–0.07)
Basophils Absolute: 0.1 10*3/uL (ref 0.0–0.1)
Basophils Relative: 2 %
Eosinophils Absolute: 0.1 10*3/uL (ref 0.0–0.5)
Eosinophils Relative: 2 %
HCT: 43.3 % (ref 36.0–46.0)
Hemoglobin: 13.8 g/dL (ref 12.0–15.0)
Immature Granulocytes: 1 %
Lymphocytes Relative: 36 %
Lymphs Abs: 1.2 10*3/uL (ref 0.7–4.0)
MCH: 29.5 pg (ref 26.0–34.0)
MCHC: 31.9 g/dL (ref 30.0–36.0)
MCV: 92.5 fL (ref 80.0–100.0)
Monocytes Absolute: 0.3 10*3/uL (ref 0.1–1.0)
Monocytes Relative: 8 %
Neutro Abs: 1.7 10*3/uL (ref 1.7–7.7)
Neutrophils Relative %: 51 %
Platelet Count: 309 10*3/uL (ref 150–400)
RBC: 4.68 MIL/uL (ref 3.87–5.11)
RDW: 13.4 % (ref 11.5–15.5)
WBC Count: 3.3 10*3/uL — ABNORMAL LOW (ref 4.0–10.5)
nRBC: 0 % (ref 0.0–0.2)

## 2020-05-06 LAB — RETICULOCYTES
Immature Retic Fract: 17.8 % — ABNORMAL HIGH (ref 2.3–15.9)
RBC.: 4.7 MIL/uL (ref 3.87–5.11)
Retic Count, Absolute: 153.2 10*3/uL (ref 19.0–186.0)
Retic Ct Pct: 3.3 % — ABNORMAL HIGH (ref 0.4–3.1)

## 2020-05-06 NOTE — Progress Notes (Signed)
Hematology and Oncology Follow Up Visit  Jamie Mcdowell [id number] 1961/03/04 59 y.o. 05/06/2020   Principle Diagnosis:  Recurrent iron deficiency anemia  Current Therapy: IV iron as indicated   Interim History:  Jamie Mcdowell is here today for follow-up. She is still noting fatigue and SOB with exertion.  She will be having her paraesophageal hernia repair on 5/26.  She has not noted any bleeding. No bruising or petechiae.  No fever, chills, n/v, cough, rash, dizziness, chest pain, palpitations, abdominal pain or changes in bowel or bladder habits.  No swelling, tenderness, numbness or tingling in her extremities. No falls or syncope.  Her appetite is good and she is staying well hydrated. Her weight is stable.   ECOG Performance Status: 1 - Symptomatic but completely ambulatory  Medications:  Allergies as of 05/06/2020      Reactions   Penicillins Anaphylaxis   Ciprocinonide [fluocinolone] Other (See Comments)   Unknown reaction   Sulfa Antibiotics Rash      Medication List       Accurate as of May 06, 2020 11:29 AM. If you have any questions, ask your nurse or doctor.        cloNIDine 0.2 MG tablet Commonly known as: Catapres Take 1 tablet (0.2 mg total) by mouth 2 (two) times daily. What changed:   how much to take  when to take this  additional instructions   pantoprazole 40 MG tablet Commonly known as: PROTONIX Take 1 tablet (40 mg total) by mouth 2 (two) times daily. What changed: when to take this   Vitamin D (Ergocalciferol) 1.25 MG (50000 UNIT) Caps capsule Commonly known as: DRISDOL Take 1 capsule (50,000 Units total) by mouth 2 (two) times a week.       Allergies:  Allergies  Allergen Reactions  . Penicillins Anaphylaxis  . Ciprocinonide [Fluocinolone] Other (See Comments)    Unknown reaction  . Sulfa Antibiotics Rash    Past Medical History, Surgical history, Social history, and Family History were reviewed and updated.  Review of  Systems: All other 10 point review of systems is negative.   Physical Exam:  vitals were not taken for this visit.   Wt Readings from Last 3 Encounters:  04/01/20 176 lb (79.8 kg)  02/12/20 178 lb 6 oz (80.9 kg)  02/08/20 177 lb 12.8 oz (80.6 kg)    Ocular: Sclerae unicteric, pupils equal, round and reactive to light Ear-nose-throat: Oropharynx clear, dentition fair Lymphatic: No cervical or supraclavicular adenopathy, no liver or spleen tip palpated on exam, no fluid wave  Lungs no rales or rhonchi, good excursion bilaterally Heart regular rate and rhythm, no murmur appreciated Abd soft, nontender, positive bowel sounds MSK no focal spinal tenderness, no joint edema Neuro: non-focal, well-oriented, appropriate affect Breasts: Deferred   Lab Results  Component Value Date   WBC 3.7 (L) 04/01/2020   HGB 12.7 04/01/2020   HCT 39.8 04/01/2020   MCV 91.9 04/01/2020   PLT 303 04/01/2020   Lab Results  Component Value Date   FERRITIN 614 (H) 04/01/2020   IRON 69 04/01/2020   TIBC 254 04/01/2020   UIBC 185 04/01/2020   IRONPCTSAT 27 04/01/2020   Lab Results  Component Value Date   RETICCTPCT 3.8 (H) 04/01/2020   RBC 4.34 04/01/2020   RBC 4.33 04/01/2020   RETICCTABS 139.2 12/12/2015   No results found for: KPAFRELGTCHN, LAMBDASER, KAPLAMBRATIO No results found for: Kandis Cocking, [facility] Lab Results  Component Value Date   TOTALPROTELP 6.8 02/28/2012  ALBUMINELP 57.8 02/28/2012   A1GS 4.0 02/28/2012   A2GS 11.7 02/28/2012   BETS 6.7 02/28/2012   BETA2SER 5.2 02/28/2012   GAMS 14.6 02/28/2012   MSPIKE NOT DET 02/28/2012   SPEI * 02/28/2012     Chemistry      Component Value Date/Time   NA 143 03/02/2020 1423   NA 148 (H) 12/19/2017 1355   NA 143 06/28/2017 1114   K 3.8 03/02/2020 1423   K 4.1 12/19/2017 1355   K 4.0 06/28/2017 1114   CL 109 03/02/2020 1423   CL 108 12/19/2017 1355   CO2 27 03/02/2020 1423   CO2 27 12/19/2017 1355   CO2 23 06/28/2017  1114   BUN 19 03/02/2020 1423   BUN 12 12/19/2017 1355   BUN 11.0 06/28/2017 1114   CREATININE 0.67 03/02/2020 1423   CREATININE 0.9 12/19/2017 1355   CREATININE 0.7 06/28/2017 1114      Component Value Date/Time   CALCIUM 10.0 03/02/2020 1423   CALCIUM 9.5 12/19/2017 1355   CALCIUM 9.5 06/28/2017 1114   ALKPHOS 115 03/02/2020 1423   ALKPHOS 88 (H) 12/19/2017 1355   ALKPHOS 88 06/28/2017 1114   AST 13 (L) 03/02/2020 1423   AST 16 06/28/2017 1114   ALT 14 03/02/2020 1423   ALT 16 12/19/2017 1355   ALT 13 06/28/2017 1114   BILITOT 0.3 03/02/2020 1423   BILITOT 0.30 06/28/2017 1114       Impression and Plan: Jamie Mcdowell is a very pleasant59 yo caucasian female withiron deficiency anemia. We will see what her iron studies show and replace if needed prior to surgery.  We will see her back in another month. She will contact our office with any questions or concerns. We can certainly see her sooner if needed.  [name], NP 5/14/202111:29 AM

## 2020-05-09 ENCOUNTER — Encounter (HOSPITAL_COMMUNITY)
Admission: RE | Admit: 2020-05-09 | Discharge: 2020-05-09 | Disposition: A | Discharge status: Home / Self Care | Payer: Managed Care, Other (non HMO) | Source: Ambulatory Visit | Attending: Surgery | Admitting: Surgery

## 2020-05-09 ENCOUNTER — Other Ambulatory Visit: Payer: Self-pay

## 2020-05-09 ENCOUNTER — Encounter (HOSPITAL_COMMUNITY): Payer: Self-pay | Admitting: *Deleted

## 2020-05-09 HISTORY — DX: Other complications of anesthesia, initial encounter: T88.59XA

## 2020-05-09 HISTORY — DX: Other specified postprocedural states: Z98.890

## 2020-05-09 HISTORY — DX: Nausea with vomiting, unspecified: R11.2

## 2020-05-09 LAB — IRON AND TIBC
Iron: 77 ug/dL (ref 41–142)
Saturation Ratios: 27 % (ref 21–57)
TIBC: 286 ug/dL (ref 236–444)
UIBC: 209 ug/dL (ref 120–384)

## 2020-05-09 LAB — FERRITIN: Ferritin: 319 ng/mL — ABNORMAL HIGH (ref 11–307)

## 2020-05-10 ENCOUNTER — Encounter (HOSPITAL_COMMUNITY)
Admission: RE | Admit: 2020-05-10 | Discharge: 2020-05-10 | Disposition: A | Discharge status: Home / Self Care | Payer: Managed Care, Other (non HMO) | Source: Ambulatory Visit | Attending: Surgery | Admitting: Surgery

## 2020-05-10 DIAGNOSIS — Z01818 Encounter for other preprocedural examination: Secondary | ICD-10-CM | POA: Diagnosis not present

## 2020-05-10 LAB — CBC WITH DIFFERENTIAL/PLATELET
Abs Immature Granulocytes: 0.02 10*3/uL (ref 0.00–0.07)
Basophils Absolute: 0.1 10*3/uL (ref 0.0–0.1)
Basophils Relative: 1 %
Eosinophils Absolute: 0.1 10*3/uL (ref 0.0–0.5)
Eosinophils Relative: 2 %
HCT: 41.7 % (ref 36.0–46.0)
Hemoglobin: 13.1 g/dL (ref 12.0–15.0)
Immature Granulocytes: 0 %
Lymphocytes Relative: 27 %
Lymphs Abs: 1.4 10*3/uL (ref 0.7–4.0)
MCH: 30.1 pg (ref 26.0–34.0)
MCHC: 31.4 g/dL (ref 30.0–36.0)
MCV: 95.9 fL (ref 80.0–100.0)
Monocytes Absolute: 0.4 10*3/uL (ref 0.1–1.0)
Monocytes Relative: 7 %
Neutro Abs: 3.3 10*3/uL (ref 1.7–7.7)
Neutrophils Relative %: 63 %
Platelets: 311 10*3/uL (ref 150–400)
RBC: 4.35 MIL/uL (ref 3.87–5.11)
RDW: 13.5 % (ref 11.5–15.5)
WBC: 5.3 10*3/uL (ref 4.0–10.5)
nRBC: 0 % (ref 0.0–0.2)

## 2020-05-10 LAB — BASIC METABOLIC PANEL
Anion gap: 9 (ref 5–15)
BUN: 16 mg/dL (ref 6–20)
CO2: 25 mmol/L (ref 22–32)
Calcium: 9.4 mg/dL (ref 8.9–10.3)
Chloride: 107 mmol/L (ref 98–111)
Creatinine, Ser: 0.63 mg/dL (ref 0.44–1.00)
GFR calc Af Amer: >60 mL/min (ref >60)
GFR calc non Af Amer: >60 mL/min (ref >60)
Glucose, Bld: 92 mg/dL (ref 70–99)
Potassium: 4.7 mmol/L (ref 3.5–5.1)
Sodium: 141 mmol/L (ref 135–145)

## 2020-05-14 ENCOUNTER — Other Ambulatory Visit (HOSPITAL_COMMUNITY)
Admission: RE | Admit: 2020-05-14 | Discharge: 2020-05-14 | Disposition: A | Discharge status: Home / Self Care | Payer: Managed Care, Other (non HMO) | Source: Ambulatory Visit | Attending: Surgery | Admitting: Surgery

## 2020-05-14 DIAGNOSIS — Z01812 Encounter for preprocedural laboratory examination: Secondary | ICD-10-CM | POA: Diagnosis present

## 2020-05-14 DIAGNOSIS — Z20822 Contact with and (suspected) exposure to covid-19: Secondary | ICD-10-CM | POA: Diagnosis not present

## 2020-05-14 LAB — SARS CORONAVIRUS 2 (TAT 6-24 HRS): SARS Coronavirus 2: NEGATIVE

## 2020-05-17 MED ORDER — BUPIVACAINE LIPOSOME 1.3 % IJ SUSP
20.0000 mL | Freq: Once | INTRAMUSCULAR | Status: DC
  Filled 2020-05-17: qty 20

## 2020-05-17 NOTE — Anesthesia Preprocedure Evaluation (Addendum)
Anesthesia Evaluation  Patient identified by MRN, date of birth, ID band Patient awake    Reviewed: Allergy & Precautions, NPO status , Patient's Chart, lab work & pertinent test results  History of Anesthesia Complications (+) PONV and history of anesthetic complications  Airway Mallampati: II  TM Distance: >3 FB Neck ROM: Full    Dental  (+) Teeth Intact, Dental Advisory Given, Caps,    Pulmonary former smoker,    Pulmonary exam normal breath sounds clear to auscultation       Cardiovascular hypertension, Pt. on medications Normal cardiovascular exam Rhythm:Regular Rate:Normal     Neuro/Psych negative neurological ROS  negative psych ROS   GI/Hepatic Neg liver ROS, GERD  Medicated and Controlled,Paraesophageal hernia   Endo/Other  Obesity   Renal/GU negative Renal ROS     Musculoskeletal negative musculoskeletal ROS (+)   Abdominal   Peds  Hematology Factor V deficiency    Anesthesia Other Findings   Reproductive/Obstetrics                           Anesthesia Physical Anesthesia Plan  ASA: II  Anesthesia Plan: General   Post-op Pain Management:    Induction: Intravenous  PONV Risk Score and Plan: 4 or greater and Diphenhydramine, Scopolamine patch - Pre-op, Midazolam, Propofol infusion, Dexamethasone and Ondansetron  Airway Management Planned: Oral ETT  Additional Equipment:   Intra-op Plan:   Post-operative Plan: Extubation in OR  Informed Consent: I have reviewed the patients History and Physical, chart, labs and discussed the procedure including the risks, benefits and alternatives for the proposed anesthesia with the patient or authorized representative who has indicated his/her understanding and acceptance.     Dental advisory given  Plan Discussed with: CRNA  Anesthesia Plan Comments: (2nd PIV)       Anesthesia Quick Evaluation

## 2020-05-18 ENCOUNTER — Observation Stay (HOSPITAL_COMMUNITY)
Admission: RE | Admit: 2020-05-18 | Discharge: 2020-05-20 | Disposition: A | Discharge status: Home / Self Care | Payer: Managed Care, Other (non HMO) | Attending: Surgery | Admitting: Surgery

## 2020-05-18 ENCOUNTER — Ambulatory Visit (HOSPITAL_COMMUNITY): Payer: Managed Care, Other (non HMO) | Admitting: Physician Assistant

## 2020-05-18 ENCOUNTER — Ambulatory Visit (HOSPITAL_COMMUNITY): Payer: Managed Care, Other (non HMO) | Admitting: Anesthesiology

## 2020-05-18 ENCOUNTER — Encounter (HOSPITAL_COMMUNITY)
Admission: RE | Disposition: A | Discharge status: Home / Self Care | Payer: Self-pay | Source: Home / Self Care | Attending: Surgery

## 2020-05-18 ENCOUNTER — Other Ambulatory Visit: Payer: Self-pay

## 2020-05-18 ENCOUNTER — Encounter (HOSPITAL_COMMUNITY): Payer: Self-pay | Admitting: Surgery

## 2020-05-18 DIAGNOSIS — Z880 Allergy status to penicillin: Secondary | ICD-10-CM | POA: Insufficient documentation

## 2020-05-18 DIAGNOSIS — Z79899 Other long term (current) drug therapy: Secondary | ICD-10-CM | POA: Insufficient documentation

## 2020-05-18 DIAGNOSIS — E559 Vitamin D deficiency, unspecified: Secondary | ICD-10-CM | POA: Insufficient documentation

## 2020-05-18 DIAGNOSIS — Z9889 Other specified postprocedural states: Secondary | ICD-10-CM

## 2020-05-18 DIAGNOSIS — D509 Iron deficiency anemia, unspecified: Secondary | ICD-10-CM | POA: Insufficient documentation

## 2020-05-18 DIAGNOSIS — E876 Hypokalemia: Secondary | ICD-10-CM | POA: Insufficient documentation

## 2020-05-18 DIAGNOSIS — D682 Hereditary deficiency of other clotting factors: Secondary | ICD-10-CM | POA: Diagnosis not present

## 2020-05-18 DIAGNOSIS — Z882 Allergy status to sulfonamides status: Secondary | ICD-10-CM | POA: Insufficient documentation

## 2020-05-18 DIAGNOSIS — K449 Diaphragmatic hernia without obstruction or gangrene: Secondary | ICD-10-CM | POA: Diagnosis not present

## 2020-05-18 DIAGNOSIS — Z8719 Personal history of other diseases of the digestive system: Secondary | ICD-10-CM

## 2020-05-18 DIAGNOSIS — Z87891 Personal history of nicotine dependence: Secondary | ICD-10-CM | POA: Insufficient documentation

## 2020-05-18 DIAGNOSIS — K589 Irritable bowel syndrome without diarrhea: Secondary | ICD-10-CM | POA: Insufficient documentation

## 2020-05-18 DIAGNOSIS — I10 Essential (primary) hypertension: Secondary | ICD-10-CM | POA: Diagnosis not present

## 2020-05-18 HISTORY — PX: XI ROBOTIC ASSISTED HIATAL HERNIA REPAIR: SHX6889

## 2020-05-18 SURGERY — REPAIR, HERNIA, HIATAL, ROBOT-ASSISTED
Anesthesia: General

## 2020-05-18 MED ORDER — FENTANYL CITRATE (PF) 100 MCG/2ML IJ SOLN
INTRAMUSCULAR | Status: AC
  Filled 2020-05-18: qty 2

## 2020-05-18 MED ORDER — CHLORHEXIDINE GLUCONATE 4 % EX LIQD: 60.0000 mL | Freq: Once | CUTANEOUS | Status: DC

## 2020-05-18 MED ORDER — EPHEDRINE 5 MG/ML INJ
INTRAVENOUS | Status: AC
  Filled 2020-05-18: qty 10

## 2020-05-18 MED ORDER — CLONIDINE HCL 0.1 MG PO TABS
0.1000 mg | ORAL_TABLET | Freq: Every day | ORAL | Status: DC
  Administered 2020-05-19 – 2020-05-20 (×2): 0.1 mg via ORAL
  Filled 2020-05-18 (×3): qty 1

## 2020-05-18 MED ORDER — MIDAZOLAM HCL 2 MG/2ML IJ SOLN
INTRAMUSCULAR | Status: AC
  Filled 2020-05-18: qty 2

## 2020-05-18 MED ORDER — DIPHENHYDRAMINE HCL 50 MG/ML IJ SOLN
INTRAMUSCULAR | Status: AC
  Filled 2020-05-18: qty 1

## 2020-05-18 MED ORDER — FENTANYL CITRATE (PF) 100 MCG/2ML IJ SOLN
25.0000 ug | INTRAMUSCULAR | Status: DC | PRN
  Administered 2020-05-18: 50 ug via INTRAVENOUS

## 2020-05-18 MED ORDER — ROCURONIUM BROMIDE 10 MG/ML (PF) SYRINGE
PREFILLED_SYRINGE | INTRAVENOUS | Status: DC | PRN
  Administered 2020-05-18: 20 mg via INTRAVENOUS
  Administered 2020-05-18: 60 mg via INTRAVENOUS
  Administered 2020-05-18: 20 mg via INTRAVENOUS

## 2020-05-18 MED ORDER — VANCOMYCIN HCL IN DEXTROSE 1-5 GM/200ML-% IV SOLN
1000.0000 mg | INTRAVENOUS | Status: AC
  Administered 2020-05-18: 1000 mg via INTRAVENOUS
  Filled 2020-05-18: qty 200

## 2020-05-18 MED ORDER — LACTATED RINGERS IV SOLN: INTRAVENOUS | Status: DC | PRN

## 2020-05-18 MED ORDER — LIDOCAINE 20MG/ML (2%) 15 ML SYRINGE OPTIME
INTRAMUSCULAR | Status: DC | PRN
  Administered 2020-05-18: 1.5 mg/kg/h via INTRAVENOUS

## 2020-05-18 MED ORDER — ACETAMINOPHEN 500 MG PO TABS
1000.0000 mg | ORAL_TABLET | ORAL | Status: AC
  Administered 2020-05-18: 1000 mg via ORAL
  Filled 2020-05-18: qty 2

## 2020-05-18 MED ORDER — PHENYLEPHRINE 40 MCG/ML (10ML) SYRINGE FOR IV PUSH (FOR BLOOD PRESSURE SUPPORT)
PREFILLED_SYRINGE | INTRAVENOUS | Status: AC
  Filled 2020-05-18: qty 10

## 2020-05-18 MED ORDER — ENOXAPARIN SODIUM 40 MG/0.4ML ~~LOC~~ SOLN: 40.0000 mg | SUBCUTANEOUS | Status: DC

## 2020-05-18 MED ORDER — ROCURONIUM BROMIDE 10 MG/ML (PF) SYRINGE
PREFILLED_SYRINGE | INTRAVENOUS | Status: AC
  Filled 2020-05-18: qty 10

## 2020-05-18 MED ORDER — METHOCARBAMOL 1000 MG/10ML IJ SOLN
500.0000 mg | Freq: Four times a day (QID) | INTRAVENOUS | Status: DC | PRN
  Administered 2020-05-18: 500 mg via INTRAVENOUS
  Filled 2020-05-18: qty 500
  Filled 2020-05-18: qty 5

## 2020-05-18 MED ORDER — PHENYLEPHRINE HCL (PRESSORS) 10 MG/ML IV SOLN
INTRAVENOUS | Status: DC | PRN
Start: 2020-05-18 — End: 2020-05-18

## 2020-05-18 MED ORDER — PROPOFOL 1000 MG/100ML IV EMUL
INTRAVENOUS | Status: AC
  Filled 2020-05-18: qty 100

## 2020-05-18 MED ORDER — GABAPENTIN 300 MG PO CAPS
300.0000 mg | ORAL_CAPSULE | ORAL | Status: AC
  Administered 2020-05-18: 300 mg via ORAL
  Filled 2020-05-18: qty 1

## 2020-05-18 MED ORDER — LACTATED RINGERS IV SOLN: INTRAVENOUS | Status: DC

## 2020-05-18 MED ORDER — BUPIVACAINE LIPOSOME 1.3 % IJ SUSP
INTRAMUSCULAR | Status: DC | PRN
  Administered 2020-05-18: 20 mL

## 2020-05-18 MED ORDER — DIPHENHYDRAMINE HCL 50 MG/ML IJ SOLN
INTRAMUSCULAR | Status: AC
  Filled 2020-05-18: qty 2

## 2020-05-18 MED ORDER — SUGAMMADEX SODIUM 200 MG/2ML IV SOLN
INTRAVENOUS | Status: DC | PRN
  Administered 2020-05-18: 300 mg via INTRAVENOUS

## 2020-05-18 MED ORDER — PANTOPRAZOLE SODIUM 40 MG IV SOLR
40.0000 mg | Freq: Every day | INTRAVENOUS | Status: DC
  Administered 2020-05-18 – 2020-05-19 (×2): 40 mg via INTRAVENOUS
  Filled 2020-05-18 (×2): qty 40

## 2020-05-18 MED ORDER — BUPIVACAINE-EPINEPHRINE (PF) 0.25% -1:200000 IJ SOLN
INTRAMUSCULAR | Status: AC
  Filled 2020-05-18: qty 30

## 2020-05-18 MED ORDER — MIDAZOLAM HCL 2 MG/2ML IJ SOLN
INTRAMUSCULAR | Status: DC | PRN
  Administered 2020-05-18: 2 mg via INTRAVENOUS

## 2020-05-18 MED ORDER — DIPHENHYDRAMINE HCL 25 MG PO CAPS: 25.0000 mg | ORAL_CAPSULE | Freq: Four times a day (QID) | ORAL | Status: DC | PRN

## 2020-05-18 MED ORDER — LACTATED RINGERS IR SOLN
Status: DC | PRN
  Administered 2020-05-18: 1000 mL

## 2020-05-18 MED ORDER — METOCLOPRAMIDE HCL 5 MG/ML IJ SOLN
10.0000 mg | Freq: Four times a day (QID) | INTRAMUSCULAR | Status: DC
  Administered 2020-05-18 – 2020-05-20 (×5): 10 mg via INTRAVENOUS
  Filled 2020-05-18 (×5): qty 2

## 2020-05-18 MED ORDER — SUCCINYLCHOLINE CHLORIDE 200 MG/10ML IV SOSY
PREFILLED_SYRINGE | INTRAVENOUS | Status: AC
  Filled 2020-05-18: qty 10

## 2020-05-18 MED ORDER — KETOROLAC TROMETHAMINE 15 MG/ML IJ SOLN
15.0000 mg | Freq: Four times a day (QID) | INTRAMUSCULAR | Status: DC | PRN
  Administered 2020-05-18: 15 mg via INTRAVENOUS
  Filled 2020-05-18: qty 1

## 2020-05-18 MED ORDER — FENTANYL CITRATE (PF) 250 MCG/5ML IJ SOLN
INTRAMUSCULAR | Status: DC | PRN
  Administered 2020-05-18: 50 ug via INTRAVENOUS
  Administered 2020-05-18: 100 ug via INTRAVENOUS
  Administered 2020-05-18 (×2): 50 ug via INTRAVENOUS

## 2020-05-18 MED ORDER — SODIUM CHLORIDE 0.9 % IV SOLN
INTRAVENOUS | Status: DC
  Administered 2020-05-18: 75 mL/h via INTRAVENOUS

## 2020-05-18 MED ORDER — LIDOCAINE 2% (20 MG/ML) 5 ML SYRINGE
INTRAMUSCULAR | Status: DC | PRN
  Administered 2020-05-18: 80 mg via INTRAVENOUS

## 2020-05-18 MED ORDER — GABAPENTIN 300 MG PO CAPS
300.0000 mg | ORAL_CAPSULE | Freq: Two times a day (BID) | ORAL | Status: DC
  Administered 2020-05-18 – 2020-05-19 (×2): 300 mg via ORAL
  Filled 2020-05-18 (×2): qty 1

## 2020-05-18 MED ORDER — DEXAMETHASONE SODIUM PHOSPHATE 10 MG/ML IJ SOLN
INTRAMUSCULAR | Status: DC | PRN
  Administered 2020-05-18: 5 mg via INTRAVENOUS

## 2020-05-18 MED ORDER — DIPHENHYDRAMINE HCL 50 MG/ML IJ SOLN
INTRAMUSCULAR | Status: DC | PRN
Start: 2020-05-18 — End: 2020-05-18
  Administered 2020-05-18: 12.5 mg via INTRAVENOUS

## 2020-05-18 MED ORDER — PROPOFOL 10 MG/ML IV BOLUS
INTRAVENOUS | Status: AC
  Filled 2020-05-18: qty 20

## 2020-05-18 MED ORDER — CLONIDINE HCL 0.1 MG PO TABS
0.2000 mg | ORAL_TABLET | Freq: Every day | ORAL | Status: DC
  Administered 2020-05-18 – 2020-05-19 (×2): 0.2 mg via ORAL
  Filled 2020-05-18 (×2): qty 2

## 2020-05-18 MED ORDER — HYDRALAZINE HCL 20 MG/ML IJ SOLN: 10.0000 mg | INTRAMUSCULAR | Status: DC | PRN

## 2020-05-18 MED ORDER — ONDANSETRON 4 MG PO TBDP: 4.0000 mg | ORAL_TABLET | Freq: Four times a day (QID) | ORAL | Status: DC | PRN

## 2020-05-18 MED ORDER — KETAMINE HCL 10 MG/ML IJ SOLN
INTRAMUSCULAR | Status: DC | PRN
  Administered 2020-05-18: 30 mg via INTRAVENOUS

## 2020-05-18 MED ORDER — SIMETHICONE 80 MG PO CHEW: 40.0000 mg | CHEWABLE_TABLET | Freq: Four times a day (QID) | ORAL | Status: DC | PRN

## 2020-05-18 MED ORDER — DIPHENHYDRAMINE HCL 50 MG/ML IJ SOLN
25.0000 mg | Freq: Four times a day (QID) | INTRAMUSCULAR | Status: DC | PRN
  Administered 2020-05-18: 25 mg via INTRAVENOUS
  Filled 2020-05-18: qty 1

## 2020-05-18 MED ORDER — PROPOFOL 10 MG/ML IV BOLUS
INTRAVENOUS | Status: DC | PRN
  Administered 2020-05-18: 50 mg via INTRAVENOUS
  Administered 2020-05-18: 100 mg via INTRAVENOUS

## 2020-05-18 MED ORDER — KETAMINE HCL 10 MG/ML IJ SOLN
INTRAMUSCULAR | Status: AC
  Filled 2020-05-18: qty 1

## 2020-05-18 MED ORDER — METOPROLOL TARTRATE 5 MG/5ML IV SOLN: 5.0000 mg | Freq: Four times a day (QID) | INTRAVENOUS | Status: DC | PRN

## 2020-05-18 MED ORDER — EPHEDRINE SULFATE-NACL 50-0.9 MG/10ML-% IV SOSY
PREFILLED_SYRINGE | INTRAVENOUS | Status: DC | PRN
  Administered 2020-05-18 (×2): 10 mg via INTRAVENOUS

## 2020-05-18 MED ORDER — FENTANYL CITRATE (PF) 250 MCG/5ML IJ SOLN
INTRAMUSCULAR | Status: AC
  Filled 2020-05-18: qty 5

## 2020-05-18 MED ORDER — PROPOFOL 500 MG/50ML IV EMUL
INTRAVENOUS | Status: DC | PRN
  Administered 2020-05-18: 50 ug/kg/min via INTRAVENOUS

## 2020-05-18 MED ORDER — AMISULPRIDE (ANTIEMETIC) 5 MG/2ML IV SOLN: 10.0000 mg | Freq: Once | INTRAVENOUS | Status: DC | PRN

## 2020-05-18 MED ORDER — PHENYLEPHRINE 40 MCG/ML (10ML) SYRINGE FOR IV PUSH (FOR BLOOD PRESSURE SUPPORT)
PREFILLED_SYRINGE | INTRAVENOUS | Status: DC | PRN
  Administered 2020-05-18: 80 ug via INTRAVENOUS
  Administered 2020-05-18: 120 ug via INTRAVENOUS

## 2020-05-18 MED ORDER — ONDANSETRON HCL 4 MG/2ML IJ SOLN: 4.0000 mg | Freq: Four times a day (QID) | INTRAMUSCULAR | Status: DC | PRN

## 2020-05-18 MED ORDER — LIDOCAINE 2% (20 MG/ML) 5 ML SYRINGE
INTRAMUSCULAR | Status: AC
  Filled 2020-05-18: qty 5

## 2020-05-18 MED ORDER — ONDANSETRON HCL 4 MG/2ML IJ SOLN
INTRAMUSCULAR | Status: DC | PRN
  Administered 2020-05-18: 4 mg via INTRAVENOUS

## 2020-05-18 MED ORDER — ONDANSETRON HCL 4 MG/2ML IJ SOLN
INTRAMUSCULAR | Status: AC
  Filled 2020-05-18: qty 2

## 2020-05-18 MED ORDER — TRAMADOL HCL 50 MG PO TABS
50.0000 mg | ORAL_TABLET | Freq: Four times a day (QID) | ORAL | Status: DC | PRN
  Administered 2020-05-19: 50 mg via ORAL
  Filled 2020-05-18 (×2): qty 1

## 2020-05-18 MED ORDER — SCOPOLAMINE 1 MG/3DAYS TD PT72
1.0000 | MEDICATED_PATCH | Freq: Once | TRANSDERMAL | Status: DC
  Administered 2020-05-18: 1.5 mg via TRANSDERMAL
  Filled 2020-05-18: qty 1

## 2020-05-18 MED ORDER — ACETAMINOPHEN 500 MG PO TABS
1000.0000 mg | ORAL_TABLET | Freq: Four times a day (QID) | ORAL | Status: DC
  Administered 2020-05-18 – 2020-05-20 (×7): 1000 mg via ORAL
  Filled 2020-05-18 (×7): qty 2

## 2020-05-18 MED ORDER — DOCUSATE SODIUM 100 MG PO CAPS
100.0000 mg | ORAL_CAPSULE | Freq: Two times a day (BID) | ORAL | Status: DC
  Administered 2020-05-18 – 2020-05-20 (×4): 100 mg via ORAL
  Filled 2020-05-18 (×5): qty 1

## 2020-05-18 MED ORDER — LIDOCAINE HCL 2 % IJ SOLN
INTRAMUSCULAR | Status: AC
  Filled 2020-05-18: qty 20

## 2020-05-18 MED ORDER — BUPIVACAINE-EPINEPHRINE 0.25% -1:200000 IJ SOLN
INTRAMUSCULAR | Status: DC | PRN
  Administered 2020-05-18: 30 mL

## 2020-05-18 MED ORDER — DEXAMETHASONE SODIUM PHOSPHATE 10 MG/ML IJ SOLN
INTRAMUSCULAR | Status: AC
  Filled 2020-05-18: qty 1

## 2020-05-18 MED ORDER — HYDROMORPHONE HCL 1 MG/ML IJ SOLN
0.5000 mg | INTRAMUSCULAR | Status: DC | PRN
  Administered 2020-05-18: 0.5 mg via INTRAVENOUS
  Filled 2020-05-18: qty 0.5

## 2020-05-18 SURGICAL SUPPLY — 58 items
APPLIER CLIP 5 13 M/L LIGAMAX5 (MISCELLANEOUS)
APPLIER CLIP ROT 10 11.4 M/L (STAPLE)
APR CLP MED LRG 11.4X10 (STAPLE)
BLADE SURG SZ11 CARB STEEL (BLADE) ×3 IMPLANT
CHLORAPREP W/TINT 26 (MISCELLANEOUS) ×3 IMPLANT
CLIP APPLIE 5 13 M/L LIGAMAX5 (MISCELLANEOUS) IMPLANT
CLIP APPLIE ROT 10 11.4 M/L (STAPLE) IMPLANT
COVER SURGICAL LIGHT HANDLE (MISCELLANEOUS) ×3 IMPLANT
COVER TIP SHEARS 8 DVNC (MISCELLANEOUS) IMPLANT
COVER TIP SHEARS 8MM DA VINCI (MISCELLANEOUS)
COVER WAND RF STERILE (DRAPES) ×3 IMPLANT
DECANTER SPIKE VIAL GLASS SM (MISCELLANEOUS) ×3 IMPLANT
DERMABOND ADVANCED (GAUZE/BANDAGES/DRESSINGS) ×2
DERMABOND ADVANCED .7 DNX12 (GAUZE/BANDAGES/DRESSINGS) ×1 IMPLANT
DRAIN PENROSE 0.5X18 (DRAIN) IMPLANT
DRAPE ARM DVNC X/XI (DISPOSABLE) ×4 IMPLANT
DRAPE COLUMN DVNC XI (DISPOSABLE) ×1 IMPLANT
DRAPE DA VINCI XI ARM (DISPOSABLE) ×12
DRAPE DA VINCI XI COLUMN (DISPOSABLE) ×3
ELECT REM PT RETURN 15FT ADLT (MISCELLANEOUS) ×3 IMPLANT
ENDOLOOP SUT PDS II  0 18 (SUTURE)
ENDOLOOP SUT PDS II 0 18 (SUTURE) IMPLANT
FELT TEFLON 4 X1 (Mesh General) ×3 IMPLANT
GAUZE 4X4 16PLY RFD (DISPOSABLE) ×3 IMPLANT
GLOVE BIO SURGEON STRL SZ 6 (GLOVE) ×9 IMPLANT
GLOVE INDICATOR 6.5 STRL GRN (GLOVE) ×9 IMPLANT
GOWN STRL REUS W/TWL LRG LVL3 (GOWN DISPOSABLE) ×9 IMPLANT
GOWN STRL REUS W/TWL XL LVL3 (GOWN DISPOSABLE) IMPLANT
KIT BASIN (CUSTOM PROCEDURE TRAY) ×3 IMPLANT
LUBRICANT JELLY K Y 4OZ (MISCELLANEOUS) IMPLANT
MARKER SKIN DUAL TIP RULER LAB (MISCELLANEOUS) ×3 IMPLANT
NEEDLE HYPO 22GX1.5 SAFETY (NEEDLE) ×3 IMPLANT
NEEDLE INSUFFLATION 14GA 120MM (NEEDLE) ×3 IMPLANT
PACK CARDIOVASCULAR III (CUSTOM PROCEDURE TRAY) ×3 IMPLANT
PAD POSITIONING PINK XL (MISCELLANEOUS) ×3 IMPLANT
SCISSORS LAP 5X35 DISP (ENDOMECHANICALS) IMPLANT
SEAL CANN UNIV 5-8 DVNC XI (MISCELLANEOUS) ×4 IMPLANT
SEAL XI 5MM-8MM UNIVERSAL (MISCELLANEOUS) ×12
SEALER VESSEL DA VINCI XI (MISCELLANEOUS) ×3
SEALER VESSEL EXT DVNC XI (MISCELLANEOUS) ×1 IMPLANT
SET IRRIG TUBING LAPAROSCOPIC (IRRIGATION / IRRIGATOR) ×3 IMPLANT
SOL ANTI FOG 6CC (MISCELLANEOUS) ×1 IMPLANT
SOLUTION ANTI FOG 6CC (MISCELLANEOUS) ×2
SOLUTION ELECTROLUBE (MISCELLANEOUS) ×3 IMPLANT
SUT ETHIBOND 0 36 GRN (SUTURE) ×9 IMPLANT
SUT MNCRL AB 4-0 PS2 18 (SUTURE) ×3 IMPLANT
SUT SILK 0 SH 30 (SUTURE) IMPLANT
SUT SILK 2 0 SH (SUTURE) IMPLANT
SYR 20ML LL LF (SYRINGE) ×3 IMPLANT
TIP INNERVISION DETACH 40FR (MISCELLANEOUS) IMPLANT
TIP INNERVISION DETACH 50FR (MISCELLANEOUS) IMPLANT
TIP INNERVISION DETACH 56FR (MISCELLANEOUS) IMPLANT
TIPS INNERVISION DETACH 40FR (MISCELLANEOUS)
TOWEL OR 17X26 10 PK STRL BLUE (TOWEL DISPOSABLE) ×3 IMPLANT
TOWEL OR NON WOVEN STRL DISP B (DISPOSABLE) ×3 IMPLANT
TRAY FOLEY MTR SLVR 16FR STAT (SET/KITS/TRAYS/PACK) IMPLANT
TROCAR ADV FIXATION 5X100MM (TROCAR) ×6 IMPLANT
TUBING INSUFFLATION 10FT LAP (TUBING) ×3 IMPLANT

## 2020-05-18 NOTE — Transfer of Care (Signed)
Immediate Anesthesia Transfer of Care Note  Patient: Jamie Mcdowell  Procedure(s) Performed: XI ROBOTIC ASSISTED PARAESOPHAGEAL HERNIA REPAIR WITH FUNDOPLICATION (N/A )  Patient Location: PACU  Anesthesia Type:General  Level of Consciousness: drowsy  Airway & Oxygen Therapy: Patient Spontanous Breathing and Patient connected to face mask oxygen  Post-op Assessment: Report given to RN and Post -op Vital signs reviewed and stable  Post vital signs: Reviewed and stable  Last Vitals:  Vitals Value Taken Time  BP 153/90 05/18/20 1123  Temp    Pulse 71 05/18/20 1126  Resp 16 05/18/20 1126  SpO2 99 % 05/18/20 1126  Vitals shown include unvalidated device data.  Last Pain:  Vitals:   05/18/20 0709  TempSrc:   PainSc: 0-No pain         Complications: No apparent anesthesia complications

## 2020-05-18 NOTE — Op Note (Signed)
Operative Note  Jamie Mcdowell  [id number]  [id number]  05/18/2020   Surgeon: Victorino Sparrow ConnorMD  Assistant: Neysa Bonito MD  Procedure performed: Mechele Claude robotic paraesophageal hernia repair, toupet fundoplication  Preop diagnosis: paraesophageal hernia, camerons ulcers/ iron deficiency anemia Post-op diagnosis/intraop findings: same  Specimens: none Retained items: none EBL: [id number] Complications: none  Description of procedure: After obtaining informed consent the patient was taken to the operating room and placed supine on operating room table wheregeneral endotracheal anesthesia was initiated, preoperative antibiotics were administered, SCDs applied, and a formal timeout was performed.  Foley catheter placed.  The abdomen was prepped and draped in the usual sterile fashion. The peritoneal cavity was entered with a periumbilical veress needle and insufflated to 26mmHg. An 31mm trocar and camera were then placed. Gross inspection revealed no evidence of injury from entry, however she had a fair amount of omental adhesions in the periumbilical region and along the falciform and our trocar was actually below the omentum.  We were able to sweep some of this down to visualize the upper abdomen. Bilateral TAPS blocks were performed under laparoscopic visualization using exparel mixed with quarter percent Marcaine with epinephrine. Under direct visualization, three additional 8 mm robotic trochars were placed as well as an assistant port in the right lower quadrant.  The omental adhesions were taken down from the abdominal wall using cold scissors.  The patient was then placed in reverse Trendelenburg.  A subxiphoid incision was made and the liver retractor introduced for fixed retraction of the left lobe.  At this point the robot was docked and all instruments inserted under direct visualization.  Beginning anteriorly the hernia sac was dissected away from the crura until it could be bluntly mobilized  out of the mediastinum and reduced into the abdomen.  This allowed the stomach to reduce into the abdomen where at rest without tension.  We completed circumferential dissection of the hernia sac away from the crura.  The short gastrics were then divided using the vessel sealer.  Mediastinal attachments of the esophagus were divided with blunt dissection and vessel sealer where necessary until we had about 5 cm of tension-free intra-abdominal esophageal length.  The hernia sac was excised.  At this point the hiatus was narrowed with several interrupted 0- Ethibonds, the three most posterior using pledgets.  A total of 7 sutures were used.  This narrowed the hiatus to approximately 2 cm in diameter.  The fundus was then brought posteriorly around the esophagus and the shoeshine maneuver performed to confirm appropriate orientation.  The fundus was tacked bilaterally to the anterior crura.  A 270 degree posterior wrap was then performed with 3 interrupted 0 Ethibond sutures on either side.  On completion, the distal esophagus and stomach are tension-free within the abdomen, and the wrap is oriented towards 11:00.   The abdomen was inspected and hemostasis confirmed.  The liver retractor was removed under direct visualization.  All trochars were removed after desufflated the abdomen.  Skin incisions were closed with subcuticular Monocryl and Dermabond.  The patient was then awakened, extubated and taken to recovery in stable condition.  All counts were correct at the completion of the case.

## 2020-05-18 NOTE — Anesthesia Procedure Notes (Addendum)
Procedure Name: Intubation Date/Time: 05/18/2020 8:36 AM Performed by: Lollie Sails, CRNA Pre-anesthesia Checklist: Patient identified, Emergency Drugs available, Suction available and Patient being monitored Patient Re-evaluated:Patient Re-evaluated prior to induction Oxygen Delivery Method: Circle system utilized Preoxygenation: Pre-oxygenation with 100% oxygen Induction Type: IV induction Ventilation: Oral airway inserted - appropriate to patient size and Two handed mask ventilation required Laryngoscope Size: Mac and 4 Grade View: Grade I Tube type: Oral Tube size: 7.0 mm Number of attempts: 1 Placement Confirmation: ETT inserted through vocal cords under direct vision,  positive ETCO2 and breath sounds checked- equal and bilateral Secured at: 22 cm Tube secured with: Tape Dental Injury: Teeth and Oropharynx as per pre-operative assessment  Comments: Intubated by lauren ferm, srna under supervision of md Dr. Gifford Shave and crna Lovey Newcomer

## 2020-05-18 NOTE — H&P (Signed)
Surgical H&P  Requesting provider: Dr. Bryan Lemma  Chief Complaint: anemia  HPI: [name] referred by Dr. Bryan Lemma for a large paraesophageal hernia with Cameron's ulcers. She is here today with her husband.  She denies symptoms of reflux, postprandial pain or nausea. However, for the last 9 years she has been suffering with iron deficiency anemia and his iron infusion dependent. This seems to have worsened recently and she is now requiring iron infusions every 4 weeks. She also has a history of vitamin D deficiency and takes 50,000 units twice a day as well as recent diagnosis of osteopenia. She has had melanotic stools intermittently throughout this time and a positive fecal occult blood test. She had a thorough endoscopic workup in 2017 (upper and lower endoscopy and video capsule endoscopy ) as well as an upper GI, and the only finding was a large paraesophageal hernia but there were no ulcerations at that time. The hernia has been known since about 2011 at least. She decided against hiatal hernia repair at that time given that it was asymptomatic and not clearly contributing to her iron deficiency anemia and she rightfully did not want to undergo a major procedure if it was not necessary.  Given the increase in severity of her iron deficiency anemia recently, she had a repeat endoscopic workup 3 weeks ago and is now noted to have Cameron ulcers. She has been started on high-dose PPI and Carafate to prevent mucosal healing. Biopsies were negative for H. pylori or other significant abnormalities. Still feels bloated from the colonoscopy prep. She follows up with Dr. Bryan Lemma next week.  She notes a history of IBS type symptoms with alternating constipation and diarrhea. Takes Colace frequently.  No prior abdominal surgical history.  She works as a Print production planner. Denies any tobacco use since high school or significant alcohol/drug use.       Allergies  Allergen  Reactions  . Penicillins Anaphylaxis  . Ciprocinonide [Fluocinolone] Other (See Comments)    Unknown reaction  . Sulfa Antibiotics Rash       Past Medical History:  Diagnosis Date  . Anemia   . Factor V deficiency ([place])   . History of blood transfusion 2019   due to pelvic bone being broken  . History of kidney stones   . Hypertension   . IBS (irritable bowel syndrome)   . Osteoporosis   . UTI (urinary tract infection)         Past Surgical History:  Procedure Laterality Date  . arm surgery Left    pen and plate inserted  . COLONOSCOPY     x3 done at [facility]  . ESOPHAGOGASTRODUODENOSCOPY     at [facility]. done the same time as the last Colonoscopy        Family History  Problem Relation Age of Onset  . Irritable bowel syndrome Father   . Clotting disorder Brother    Factor V deficiency   . Stroke Brother    multiple times x3  . Irritable bowel syndrome Brother   . Clotting disorder Daughter    factor V deficiency  . Colon cancer Neg Hx   . Esophageal cancer Neg Hx   . Stomach cancer Neg Hx   . Rectal cancer Neg Hx    Social History        Socioeconomic History  . Marital status: Married    Spouse name: Not on file  . Number of children: Not on file  .  Years of education: Not on file  . Highest education level: Not on file  Occupational History  . Not on file  Tobacco Use  . Smoking status: Former Smoker    Packs/day: 0.50    Years: 8.00    Pack years: 4.00    Types: Cigarettes    Start date: 01/19/1975    Quit date: 01/19/1983    Years since quitting: 37.1  . Smokeless tobacco: Never Used  . Tobacco comment: quit 31 years ago  Substance and Sexual Activity  . Alcohol use: Yes    Comment: rare  . Drug use: Never  . Sexual activity: Not on file  Other Topics Concern  . Not on file  Social History Narrative  . Not on file   Social Determinants of Health      Financial Resource Strain:   . Difficulty of Paying Living Expenses:    Food Insecurity:   . Worried About Charity fundraiser in the Last Year:   . Arboriculturist in the Last Year:   Transportation Needs:   . Film/video editor (Medical):   Marland Kitchen Lack of Transportation (Non-Medical):   Physical Activity:   . Days of Exercise per Week:   . Minutes of Exercise per Session:   Stress:   . Feeling of Stress :   Social Connections:   . Frequency of Communication with Friends and Family:   . Frequency of Social Gatherings with Friends and Family:   . Attends Religious Services:   . Active Member of Clubs or Organizations:   . Attends Archivist Meetings:   Marland Kitchen Marital Status:          Current Outpatient Medications on File Prior to Visit  Medication Sig Dispense Refill  . acetaminophen (TYLENOL) 500 MG tablet Take 1,000 mg by mouth every 6 (six) hours as needed for moderate pain.    . calcium carbonate (TUMS - DOSED IN MG ELEMENTAL CALCIUM) 500 MG chewable tablet Chew 1 tablet by mouth 2 (two) times daily.    . cloNIDine (CATAPRES) 0.2 MG tablet Take 1 tablet (0.2 mg total) by mouth 2 (two) times daily. (Patient taking differently: Take 0.1-0.2 mg by mouth See admin instructions. 0.1 mg in the am and 0.2 mg in the evening) 60 tablet 1  . nitrofurantoin, macrocrystal-monohydrate, (MACROBID) 100 MG capsule Take 100 mg by mouth 2 (two) times daily.    . pantoprazole (PROTONIX) 40 MG tablet Take 1 tablet (40 mg total) by mouth 2 (two) times daily. (Patient taking differently: Take 40 mg by mouth at bedtime. ) 90 tablet 1  . Vitamin D, Ergocalciferol, (DRISDOL) 1.25 MG (50000 UT) CAPS capsule Take 1 capsule (50,000 Units total) by mouth 2 (two) times a week. 16 capsule 6   No current facility-administered medications on file prior to visit.   Review of Systems: a complete, 10pt review of systems was completed with pertinent positives and negatives as documented in the HPI  Physical Exam:  Vitals  Weight: 181.38 lb Height: 64 in  Body Surface Area:  1.88 m Body Mass Index: 31.13 kg/m  Temp.: 97.1 F Pulse: 77 (Regular)  She is alert, well-appearing  Unlabored respirations  Abdomen is soft, nondistended, mildly tender in the right lower quadrant  CBC Latest Ref Rng & Units 03/02/2020 02/08/2020 01/04/2020  WBC 4.0 - 10.5 K/uL 4.8 3.6(L) 3.3(L)  Hemoglobin 12.0 - 15.0 g/dL 13.0 13.5 11.9(L)  Hematocrit 36.0 - 46.0 % 41.2 42.4 38.9  Platelets 150 - 400 K/uL 296 247 358   CMP Latest Ref Rng & Units 03/02/2020 01/04/2020 11/30/2019  Glucose 70 - 99 mg/dL 90 96 106(H)  BUN 6 - 20 mg/dL 19 16 15   Creatinine 0.44 - 1.00 mg/dL 0.67 0.70 0.62  Sodium 135 - 145 mmol/L 143 143 146(H)  Potassium 3.5 - 5.1 mmol/L 3.8 4.2 4.0  Chloride 98 - 111 mmol/L 109 109 112(H)  CO2 22 - 32 mmol/L 27 27 26   Calcium 8.9 - 10.3 mg/dL 10.0 9.7 9.1  Total Protein 6.5 - 8.1 g/dL 7.0 6.8 6.5  Total Bilirubin 0.3 - 1.2 mg/dL 0.3 0.3 0.3  Alkaline Phos 38 - 126 U/L 115 102 69  AST 15 - 41 U/L 13(L) 12(L) 13(L)  ALT 0 - 44 U/L 14 12 11    Recent Labs    Imaging:  Imaging Results (Last 48 hours)    A/P: PARAESOPHAGEAL HERNIA (K44.9)  Story: In the setting of symptomatic anemia and iron infusion dependence and finding of Cameron's ulcers, discussed that repair is indicated. This is done robotically or laparoscopically. Discussed typically would do a partial/toupet fundoplication. We had a long discussion and went over the relevant anatomy, details of the surgical technique, and risks of surgery using a diagram to demonstrate. Discussed the typical pre-, peri-and postoperative course. Discussed risks of surgery including bleeding, infection, pain, scarring, injury to intra-abdominal or mediastinal structures, hernia recurrence, dysphagia, gas bloat, failure to resolve symptoms, as well as general risks including cardiovascular, thromboembolic and pulmonary complications all of which are rare. Questions were answered. 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 Advised that our OR schedule is not available more than 3 months out so I requested her to please call us in March so that we can move forward with scheduling if that is what she wishes to do.  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 Marin Olp  IRRITABLE BOWEL SYNDROME (K58.9)  VITAMIN D DEFICIENCY (E55.9)  Story: She has had this for many years as well, it seems as though she has an absorption issue contributing to her iron deficiency, vitamin D deficiency, osteopenia, etc. these are all absorbed in the same area  OSTEOPENIA (M85.80)  HYPERTENSION (I10)      Patient Active Problem List   Diagnosis Date Noted  . Closed fracture of multiple pubic rami, left, initial encounter ([place]) 06/05/2018  . Closed fracture of phalanx of left second toe 06/05/2018  . BMI 30.0-30.9,adult 06/05/2018  . Hypokalemia 06/05/2018  . Acute urinary retention 06/05/2018  . HTN (hypertension) 03/12/2013  . Iron deficiency anemia 10/20/2011   Romana Juniper, MD  [facility], [place]

## 2020-05-18 NOTE — Anesthesia Postprocedure Evaluation (Signed)
Anesthesia Post Note  Patient: Jamie Mcdowell  Procedure(s) Performed: XI ROBOTIC ASSISTED PARAESOPHAGEAL HERNIA REPAIR WITH FUNDOPLICATION (N/A )     Patient location during evaluation: PACU Anesthesia Type: General Level of consciousness: awake and alert Pain management: pain level controlled Vital Signs Assessment: post-procedure vital signs reviewed and stable Respiratory status: spontaneous breathing, nonlabored ventilation and respiratory function stable Cardiovascular status: blood pressure returned to baseline and stable Postop Assessment: no apparent nausea or vomiting Anesthetic complications: no    Last Vitals:  Vitals:   05/18/20 1609 05/18/20 1742  BP: (!) 150/84 (!) 151/88  Pulse: 65 71  Resp: 18 18  Temp: (!) 36.4 C   SpO2: 93% 97%    Last Pain:  Vitals:   05/18/20 1609  TempSrc: Oral  PainSc:                  Catalina Gravel

## 2020-05-19 ENCOUNTER — Encounter: Payer: Self-pay | Admitting: *Deleted

## 2020-05-19 ENCOUNTER — Observation Stay (HOSPITAL_COMMUNITY): Payer: Managed Care, Other (non HMO)

## 2020-05-19 DIAGNOSIS — K449 Diaphragmatic hernia without obstruction or gangrene: Secondary | ICD-10-CM | POA: Diagnosis not present

## 2020-05-19 DIAGNOSIS — Z8719 Personal history of other diseases of the digestive system: Secondary | ICD-10-CM

## 2020-05-19 LAB — CBC
HCT: 35.2 % — ABNORMAL LOW (ref 36.0–46.0)
Hemoglobin: 11 g/dL — ABNORMAL LOW (ref 12.0–15.0)
MCH: 30.5 pg (ref 26.0–34.0)
MCHC: 31.3 g/dL (ref 30.0–36.0)
MCV: 97.5 fL (ref 80.0–100.0)
Platelets: 256 10*3/uL (ref 150–400)
RBC: 3.61 MIL/uL — ABNORMAL LOW (ref 3.87–5.11)
RDW: 13.4 % (ref 11.5–15.5)
WBC: 6.8 10*3/uL (ref 4.0–10.5)
nRBC: 0 % (ref 0.0–0.2)

## 2020-05-19 LAB — BASIC METABOLIC PANEL
Anion gap: 10 (ref 5–15)
BUN: 10 mg/dL (ref 6–20)
CO2: 23 mmol/L (ref 22–32)
Calcium: 8.1 mg/dL — ABNORMAL LOW (ref 8.9–10.3)
Chloride: 103 mmol/L (ref 98–111)
Creatinine, Ser: 0.55 mg/dL (ref 0.44–1.00)
GFR calc Af Amer: >60 mL/min (ref >60)
GFR calc non Af Amer: >60 mL/min (ref >60)
Glucose, Bld: 95 mg/dL (ref 70–99)
Potassium: 3.6 mmol/L (ref 3.5–5.1)
Sodium: 136 mmol/L (ref 135–145)

## 2020-05-19 LAB — HEMOGLOBIN AND HEMATOCRIT, BLOOD
HCT: 36.4 % (ref 36.0–46.0)
Hemoglobin: 11.3 g/dL — ABNORMAL LOW (ref 12.0–15.0)

## 2020-05-19 LAB — MAGNESIUM: Magnesium: 2.1 mg/dL (ref 1.7–2.4)

## 2020-05-19 MED ORDER — POTASSIUM CHLORIDE 20 MEQ PO PACK
40.0000 meq | PACK | Freq: Once | ORAL | Status: DC
  Filled 2020-05-19: qty 2

## 2020-05-19 MED ORDER — IOHEXOL 300 MG/ML  SOLN
50.0000 mL | Freq: Once | INTRAMUSCULAR | Status: AC | PRN
  Administered 2020-05-19: 50 mL via ORAL

## 2020-05-19 MED ORDER — DOCUSATE SODIUM 100 MG PO CAPS: 100.0000 mg | ORAL_CAPSULE | Freq: Two times a day (BID) | ORAL | 0 refills | Status: AC

## 2020-05-19 MED ORDER — POTASSIUM CHLORIDE CRYS ER 20 MEQ PO TBCR
40.0000 meq | EXTENDED_RELEASE_TABLET | Freq: Once | ORAL | Status: AC
  Administered 2020-05-19: 40 meq via ORAL
  Filled 2020-05-19: qty 2

## 2020-05-19 MED ORDER — TRAMADOL HCL 50 MG PO TABS: 50.0000 mg | ORAL_TABLET | Freq: Four times a day (QID) | ORAL | 0 refills | Status: DC | PRN

## 2020-05-19 MED ORDER — ACETAMINOPHEN 500 MG PO TABS: 1000.0000 mg | ORAL_TABLET | Freq: Four times a day (QID) | ORAL | 0 refills | Status: AC

## 2020-05-19 MED ORDER — ONDANSETRON 4 MG PO TBDP: 4.0000 mg | ORAL_TABLET | Freq: Four times a day (QID) | ORAL | 0 refills | Status: AC | PRN

## 2020-05-19 NOTE — Discharge Summary (Signed)
Physician Discharge Summary  Patient ID: Jamie Mcdowell MRN: [id number] DOB/AGE: November 21, 1961 59 y.o.  Admit date: 05/18/2020 Discharge date: 05/20/2020  Admission Diagnoses: paraesophageal hernia, camerons ulcers  Discharge Diagnoses:  Active Problems:   S/P repair of paraesophageal hernia   Discharged Condition: good  Hospital Course: Admitted for supportive care following robotic paraesophageal hernia repair with toupet fundoplication. On the morning of post-op day 1 she was tolerating liquids without dysphagia or nausea. Upper GI demonstrated expected post-op anatomy. Her hemoglobin did drop from 13 preop to 11 on post-op day 1 without clinical concern of bleeding. A repeat hemoglobin that afternoon was stable. Vital signs and other labs remained normal. She was ambulating, voiding, and pain was well controlled. She did have some subjective shortness of breath while walking for the first time on the evening of post-op day 1 and remained in the hospital one more night. ON Post-op day 2 she was doing well. Tolerating puree, mild sticking in distal esophagus as expected. Deemed stable for discharge.   Consults: none  Significant Diagnostic Studies: see epic  Treatments: surgery as above  Discharge Exam: Blood pressure (!) 156/85, pulse (!) 57, temperature 97.6 F (36.4 C), temperature source Oral, resp. rate 17, height 5\' 4"  (1.626 m), weight 81.3 kg, SpO2 95 %. Alert and comfortable Unlabored respirations Abdomen soft, minimally tender, minimally distended. Incisions c/d/i with dermabond, no cellulitis or hematoma.  No lower extremity edema.   Disposition: Discharge disposition: 01-Home or Self Care        Allergies as of 05/20/2020      Reactions   Penicillins Anaphylaxis   Ciprocinonide [fluocinolone] Other (See Comments)   Unknown reaction   Latex Dermatitis   Itchy, redness   Sulfa Antibiotics Rash      Medication List    TAKE these medications   acetaminophen 500  MG tablet Commonly known as: TYLENOL Take 2 tablets (1,000 mg total) by mouth every 6 (six) hours. After 3-4 days, can change to taking only as needed   cloNIDine 0.2 MG tablet Commonly known as: Catapres Take 1 tablet (0.2 mg total) by mouth 2 (two) times daily. What changed:   how much to take  when to take this  additional instructions   docusate sodium 100 MG capsule Commonly known as: COLACE Take 1 capsule (100 mg total) by mouth 2 (two) times daily.   fexofenadine 180 MG tablet Commonly known as: ALLEGRA Take 180 mg by mouth as needed for allergies or rhinitis.   ondansetron 4 MG disintegrating tablet Commonly known as: ZOFRAN-ODT Take 1 tablet (4 mg total) by mouth every 6 (six) hours as needed for nausea.   pantoprazole 40 MG tablet Commonly known as: PROTONIX Take 1 tablet (40 mg total) by mouth 2 (two) times daily. What changed: when to take this   traMADol 50 MG tablet Commonly known as: ULTRAM Take 1 tablet (50 mg total) by mouth every 6 (six) hours as needed (moderate pain, pain not controlled with tylenol).   Vitamin D (Ergocalciferol) 1.25 MG (50000 UNIT) Caps capsule Commonly known as: DRISDOL Take 1 capsule (50,000 Units total) by mouth 2 (two) times a week. Notes to patient: Hold this medication for the next 1-2 weeks, then can resume       Follow-up Information    Clovis Riley, MD. Schedule an appointment as soon as possible for a visit in 3 week(s).   Specialty: General Surgery Contact information: [address] [place] [address] [phone]  Signed: Clovis Riley 05/20/2020, 8:29 AM

## 2020-05-19 NOTE — Progress Notes (Signed)
Daily progress note   S: Slept well. Some neck and shoulder pain but otherwise comfortable. Tolerating liquids without dysphagia or nausea. No abdominal pain.   O: Vitals and labs reviewed. No tachycardia or fever. Sats 95 on room air. Normo- to hypertensive. Hgb down to 11 from 13.2 preop. K 3.6.  Otherwise unremarkable. UOP excellent.   Alert and well appearing Unlabored respirations Abdomen soft, nontender. Incisions c/d/i with dermabond, no cellulitis or hematoma No LE edema  A/P: POD 1 robotic paraesophageal hernia repair with toupet fundoplication -Puree diet -Hypokalemia, replace PO -UGI this morning -Ambulate -Plan discharge today

## 2020-05-20 DIAGNOSIS — K449 Diaphragmatic hernia without obstruction or gangrene: Secondary | ICD-10-CM | POA: Diagnosis not present

## 2020-05-20 MED ORDER — TRAMADOL HCL 50 MG PO TABS: 50.0000 mg | ORAL_TABLET | Freq: Four times a day (QID) | ORAL | 0 refills | Status: DC | PRN

## 2020-05-20 NOTE — Discharge Instructions (Signed)
Post-op Instructions  EAT Start with a pureed / full liquid diet (see below) Gradually transition to a high fiber diet with a fiber supplement over the next couple months after discharge.    WALK Walk an hour a day.  Control your pain to do that.  No heavy lifting or straining for at least 6 weeks.  CONTROL PAIN Control pain so that you can walk, sleep, tolerate sneezing/coughing, go up/down stairs.  HAVE A BOWEL MOVEMENT DAILY- this may take a few days to happen Keep your bowels regular to avoid problems. Helpful to take a regular stool softener such as colace or miralax for the first couple weeks. OK to try a laxative or suppository to override constipation.  OK to use an antidiarrheal to slow down diarrhea.  Call if not better after 2 tries. Avoid straining with bowel movements. Avoid wretching/ vomiting!  CALL IF YOU HAVE PROBLEMS/CONCERNS Call if you are still struggling despite following these instructions. Call if you have concerns not answered by these instructions  OK to shower and pat dry. Glue will flake off in 1-2 weeks.    After your esophageal surgery, expect some sticking with swallowing over the next 1-2 months.    If food sticks when you eat, it is called "dysphagia".  This is due to swelling around your esophagus at the wrap & hiatal diaphragm repair.  It will gradually ease off over the next few months.  To help you through this temporary phase, we start you out on a pureed (blenderized) diet.  Your first meal in the hospital was thin liquids.  You should have been given a pureed diet by the time you left the hospital.  We ask patients to stay on a pureed diet for the first 2-3 weeks to avoid anything getting "stuck" near your recent surgery.  Don't be alarmed if your ability to swallow doesn't progress according to this plan.  Everyone is different and some diets can advance more or less quickly.    It is often helpful to crush your medications or split them as they  can sometimes stick, especially the first week or so.   Some BASIC RULES to follow are:  Maintain an upright position whenever eating or drinking.  Take small bites - just a teaspoon size bite at a time.  Eat slowly.  It may also help to eat only one food at a time.  Consider nibbling through smaller, more frequent meals & avoid the urge to eat BIG meals  Do not push through feelings of fullness, nausea, or bloatedness  Do not mix solid foods and liquids in the same mouthful  Try not to "wash foods down" with large gulps of liquids.  Avoid carbonated (bubbly/fizzy) drinks.    Avoid foods that make you feel gassy or bloated.  Start with bland foods first.  Wait on trying greasy, fried, or spicy meals until you are tolerating more bland solids well.  Understand that it will be hard to burp and belch at first.  This gradually improves with time.  Expect to be more gassy/flatulent/bloated initially.  Walking will help your body manage it better.  Consider using medications for bloating that contain simethicone such as  Maalox or Gas-X   Consider crushing her medications, especially smaller pills.  The ability to swallow pills should get easier after a few weeks  Eat in a relaxed atmosphere & minimize distractions.  Avoid talking while eating.    Do not use straws.  Following each  meal, sit in an upright position (90 degree angle) for 60 to 90 minutes.  Going for a short walk can help as well  If food does stick, don't panic.  Try to relax and let the food pass on its own.  Sipping WARM LIQUID such as strong hot black tea can also help slide it down.   Be gradual in changes & use common sense:  -If you easily tolerating a certain "level" of foods, advance to the next level gradually -If you are having trouble swallowing a particular food, then avoid it.   -If food is sticking when you advance your diet, go back to thinner previous diet (the lower LEVEL) for 1-2 days.  LEVEL 1  = PUREED DIET  Do for the first 2 WEEKS AFTER SURGERY  -Foods in this group are pureed or blenderized to a smooth, mashed potato-like consistency.  -If necessary, the pureed foods can keep their shape with the addition of a thickening agent.   -Meat should be pureed to a smooth, pasty consistency.  Hot broth or gravy may be added to the pureed meat, approximately 1 oz. of liquid per 3 oz. serving of meat. -CAUTION:  If any foods do not puree into a smooth consistency, swallowing will be more difficult.  (For example, nuts or seeds sometimes do not blend well.)  Hot Foods Cold Foods  Pureed scrambled eggs and cheese Pureed cottage cheese  Baby cereals Thickened juices and nectars  Thinned cooked cereals (no lumps) Thickened milk or eggnog  Pureed [place] toast or pancakes Ensure  Mashed potatoes Ice cream  Pureed parsley, au gratin, scalloped potatoes, candied sweet potatoes Fruit or [place] ice, sherbet  Pureed buttered or alfredo noodles Plain yogurt  Pureed vegetables (no corn or peas) Instant breakfast  Pureed soups and creamed soups Smooth pudding, mousse, custard  Pureed scalloped apples Whipped gelatin  Gravies Sugar, syrup, honey, jelly  Sauces, cheese, tomato, barbecue, white, creamed Cream  Any baby food Creamer  Alcohol in moderation (not beer or champagne) Margarine  Coffee or tea Mayonnaise   Ketchup, mustard   Apple sauce   SAMPLE MENU:  PUREED DIET Breakfast Lunch Dinner   Orange juice, 1/2 cup  Cream of wheat, 1/2 cup  Pineapple juice, 1/2 cup  Pureed [place], barley soup, 3/4 cup  Pureed Hawaiian chicken, 3 oz   Scrambled eggs, mashed or blended with cheese, 1/2 cup  Tea or coffee, 1 cup   Whole milk, 1 cup   Non-dairy creamer, 2 Tbsp.  Mashed potatoes, 1/2 cup  Pureed cooled broccoli, 1/2 cup  Apple sauce, 1/2 cup  Coffee or tea  Mashed potatoes, 1/2 cup  Pureed spinach, 1/2 cup  Frozen yogurt, 1/2 cup  Tea or coffee      LEVEL 2 = SOFT  DIET  After your first 2 weeks, you can advance to a soft diet.   Keep on this diet until everything goes down easily.  Hot Foods Cold Foods  White fish Cottage cheese  Stuffed fish Junior baby fruit  Baby food meals Semi thickened juices  Minced soft cooked, scrambled, poached eggs nectars  Souffle & omelets Ripe mashed bananas  Cooked cereals Canned fruit, pineapple sauce, milk  potatoes Milkshake  Buttered or Alfredo noodles Custard  Cooked cooled vegetable Puddings, including tapioca  Sherbet Yogurt  Vegetable soup or alphabet soup Fruit ice, [place] ice  Gravies Whipped gelatin  Sugar, syrup, honey, jelly Junior baby desserts  Sauces:  Cheese, creamed, barbecue, tomato, white  Cream  Coffee or tea Margarine   SAMPLE MENU:  LEVEL 2 Breakfast Lunch Dinner   Orange juice, 1/2 cup  Oatmeal, 1/2 cup  Scrambled eggs with cheese, 1/2 cup  Decaffeinated tea, 1 cup  Whole milk, 1 cup  Non-dairy creamer, 2 Tbsp  Pineapple juice, 1/2 cup  Minced beef, 3 oz  Gravy, 2 Tbsp  Mashed potatoes, 1/2 cup  Minced fresh broccoli, 1/2 cup  Applesauce, 1/2 cup  Coffee, 1 cup  [place], barley soup, 3/4 cup  Minced Hawaiian chicken, 3 oz  Mashed potatoes, 1/2 cup  Cooked spinach, 1/2 cup  Frozen yogurt, 1/2 cup  Non-dairy creamer, 2 Tbsp      LEVEL 3 = CHOPPED DIET  -After all the foods in level 2 (soft diet) are passing through well you should advance up to more chopped foods.  -It is still important to cut these foods into small pieces and eat slowly.  Hot Foods Cold Foods  Poultry Cottage cheese  Chopped Swedish meatballs Yogurt  Meat salads (ground or flaked meat) Milk  Flaked fish (tuna) Milkshakes  Poached or scrambled eggs Soft, cold, dry cereal  Souffles and omelets Fruit juices or nectars  Cooked cereals Chopped canned fruit  Chopped [place] toast or pancakes Canned fruit cocktail  Noodles or pasta (no rice) Pudding, mousse, custard  Cooked vegetables  (no frozen peas, corn, or mixed vegetables) Green salad  Canned small sweet peas Ice cream  Creamed soup or vegetable soup Fruit ice, [place] ice  Pureed vegetable soup or alphabet soup Non-dairy creamer  Ground scalloped apples Margarine  Gravies Mayonnaise  Sauces:  Cheese, creamed, barbecue, tomato, white Ketchup  Coffee or tea Mustard   SAMPLE MENU:  LEVEL 3 Breakfast Lunch Dinner   Orange juice, 1/2 cup  Oatmeal, 1/2 cup  Scrambled eggs with cheese, 1/2 cup  Decaffeinated tea, 1 cup  Whole milk, 1 cup  Non-dairy creamer, 2 Tbsp  Ketchup, 1 Tbsp  Margarine, 1 tsp  Salt, 1/4 tsp  Sugar, 2 tsp  Pineapple juice, 1/2 cup  Ground beef, 3 oz  Gravy, 2 Tbsp  Mashed potatoes, 1/2 cup  Cooked spinach, 1/2 cup  Applesauce, 1/2 cup  Decaffeinated coffee  Whole milk  Non-dairy creamer, 2 Tbsp  Margarine, 1 tsp  Salt, 1/4 tsp  Pureed [place], barley soup, 3/4 cup  Barbecue chicken, 3 oz  Mashed potatoes, 1/2 cup  Ground fresh broccoli, 1/2 cup  Frozen yogurt, 1/2 cup  Decaffeinated tea, 1 cup  Non-dairy creamer, 2 Tbsp  Margarine, 1 tsp  Salt, 1/4 tsp  Sugar, 1 tsp    LEVEL 4:  REGULAR FOODS  -Foods in this group are soft, moist, regularly textured foods.   -This level includes meat and breads, which tend to be the hardest things to swallow.   -Eat very slowly, chew well and continue to avoid carbonated drinks. -most people are at this level in 4-6 weeks  Hot Foods Cold Foods  Baked fish or skinned Soft cheeses - cottage cheese  Souffles and omelets Cream cheese  Eggs Yogurt  Stuffed shells Milk  Spaghetti with meat sauce Milkshakes  Cooked cereal Cold dry cereals (no nuts, dried fruit, coconut)  [place] toast or pancakes Crackers  Buttered toast Fruit juices or nectars  Noodles or pasta (no rice) Canned fruit  Potatoes (all types) Ripe bananas  Soft, cooked vegetables (no corn, lima, or baked beans) Peeled, ripe, fresh fruit  Creamed  soups or vegetable soup Cakes (no nuts, dried fruit,  coconut)  Canned chicken noodle soup Plain doughnuts  Gravies Ice cream  Bacon dressing Pudding, mousse, custard  Sauces:  Cheese, creamed, barbecue, tomato, white Fruit ice, [place] ice, sherbet  Decaffeinated tea or coffee Whipped gelatin  Pork chops Regular gelatin   Canned fruited gelatin molds   Sugar, syrup, honey, jam, jelly   Cream   Non-dairy   Margarine   Oil   Mayonnaise   Ketchup   Mustard   TROUBLESHOOTING IRREGULAR BOWELS  1) Avoid extremes of bowel movements (no bad constipation/diarrhea)  2) Miralax 17gm mixed in 8oz. water or juice-daily. May use BID as needed.  3) Gas-x,Phazyme, etc. as needed for gas & bloating.  4) Soft,bland diet. No spicy,greasy,fried foods.  5) Prilosec over-the-counter as needed  6) May hold gluten/wheat products from diet to see if symptoms improve.  7) May try probiotics (Align, Activa, etc) to help calm the bowels down  7) If symptoms become worse call back immediately.    If you have any questions please call our office at [place]: [phone].

## 2020-06-09 ENCOUNTER — Inpatient Hospital Stay: Payer: Managed Care, Other (non HMO)

## 2020-06-09 ENCOUNTER — Other Ambulatory Visit: Payer: Self-pay

## 2020-06-09 ENCOUNTER — Inpatient Hospital Stay: Payer: Managed Care, Other (non HMO) | Attending: Hematology & Oncology | Admitting: Family

## 2020-06-09 ENCOUNTER — Encounter: Payer: Self-pay | Admitting: Family

## 2020-06-09 VITALS — BP 130/95 | HR 70 | Temp 98.1°F | Resp 18 | Ht 64.0 in | Wt 169.0 lb

## 2020-06-09 DIAGNOSIS — D509 Iron deficiency anemia, unspecified: Secondary | ICD-10-CM | POA: Diagnosis not present

## 2020-06-09 DIAGNOSIS — D649 Anemia, unspecified: Secondary | ICD-10-CM

## 2020-06-09 DIAGNOSIS — D500 Iron deficiency anemia secondary to blood loss (chronic): Secondary | ICD-10-CM

## 2020-06-09 LAB — CBC WITH DIFFERENTIAL (CANCER CENTER ONLY)
Abs Immature Granulocytes: 0.06 10*3/uL (ref 0.00–0.07)
Basophils Absolute: 0 10*3/uL (ref 0.0–0.1)
Basophils Relative: 1 %
Eosinophils Absolute: 0.1 10*3/uL (ref 0.0–0.5)
Eosinophils Relative: 3 %
HCT: 44 % (ref 36.0–46.0)
Hemoglobin: 13.8 g/dL (ref 12.0–15.0)
Immature Granulocytes: 1 %
Lymphocytes Relative: 28 %
Lymphs Abs: 1.2 10*3/uL (ref 0.7–4.0)
MCH: 28.9 pg (ref 26.0–34.0)
MCHC: 31.4 g/dL (ref 30.0–36.0)
MCV: 92.2 fL (ref 80.0–100.0)
Monocytes Absolute: 0.4 10*3/uL (ref 0.1–1.0)
Monocytes Relative: 8 %
Neutro Abs: 2.7 10*3/uL (ref 1.7–7.7)
Neutrophils Relative %: 59 %
Platelet Count: 314 10*3/uL (ref 150–400)
RBC: 4.77 MIL/uL (ref 3.87–5.11)
RDW: 12.9 % (ref 11.5–15.5)
WBC Count: 4.4 10*3/uL (ref 4.0–10.5)
nRBC: 0 % (ref 0.0–0.2)

## 2020-06-09 LAB — RETICULOCYTES
Immature Retic Fract: 16.2 % — ABNORMAL HIGH (ref 2.3–15.9)
RBC.: 4.64 MIL/uL (ref 3.87–5.11)
Retic Count, Absolute: 98.8 10*3/uL (ref 19.0–186.0)
Retic Ct Pct: 2.1 % (ref 0.4–3.1)

## 2020-06-09 NOTE — Progress Notes (Signed)
Hematology and Oncology Follow Up Visit  Jamie Mcdowell [id number] July 15, 1961 59 y.o. 06/09/2020   Principle Diagnosis:  Recurrent iron deficiency anemia  Current Therapy: IV iron as indicated   Interim History:  Jamie Mcdowell is here today for follow-up. She is doing quite well and has recuperated from her hiatal hernia repair with Dr. Kae Heller.  Her Hgb is now up to 13.8, MCV 92.  No episodes of bleeding. No bruising or petechiae.  No fever, chills, n/v, cough, rash, dizziness, chest pain, palpitations, abdominal pain or changes in bowel or bladder habits.  She has occasional SOB with over exertion and takes a break to rest when needed.  No swelling, tenderness, numbness or tingling in her extremities. No falls or syncope.  She has maintained a good appetite and is now on full liquids and pasta and is staying well hydrated. Her weight is stable.   ECOG Performance Status: 1 - Symptomatic but completely ambulatory  Medications:  Allergies as of 06/09/2020      Reactions   Penicillins Anaphylaxis   Ciprocinonide [fluocinolone] Other (See Comments)   Unknown reaction   Latex Itching, Rash   Sulfa Antibiotics Rash      Medication List       Accurate as of June 09, 2020  3:49 PM. If you have any questions, ask your nurse or doctor.        STOP taking these medications   traMADol 50 MG tablet Commonly known as: ULTRAM Stopped by: [name], NP     TAKE these medications   acetaminophen 500 MG tablet Commonly known as: TYLENOL Take 2 tablets (1,000 mg total) by mouth every 6 (six) hours. After 3-4 days, can change to taking only as needed   cloNIDine 0.2 MG tablet Commonly known as: Catapres Take 1 tablet (0.2 mg total) by mouth 2 (two) times daily. What changed:   how much to take  when to take this  additional instructions   docusate sodium 100 MG capsule Commonly known as: COLACE Take 1 capsule (100 mg total) by mouth 2 (two) times daily.     fexofenadine 180 MG tablet Commonly known as: ALLEGRA Take 180 mg by mouth as needed for allergies or rhinitis.   ondansetron 4 MG disintegrating tablet Commonly known as: ZOFRAN-ODT Take 1 tablet (4 mg total) by mouth every 6 (six) hours as needed for nausea.   pantoprazole 40 MG tablet Commonly known as: PROTONIX Take 1 tablet (40 mg total) by mouth 2 (two) times daily. What changed: when to take this   Vitamin D (Ergocalciferol) 1.25 MG (50000 UNIT) Caps capsule Commonly known as: DRISDOL Take 1 capsule (50,000 Units total) by mouth 2 (two) times a week.       Allergies:  Allergies  Allergen Reactions  . Penicillins Anaphylaxis  . Ciprocinonide [Fluocinolone] Other (See Comments)    Unknown reaction  . Latex Itching and Rash  . Sulfa Antibiotics Rash    Past Medical History, Surgical history, Social history, and Family History were reviewed and updated.  Review of Systems: All other 10 point review of systems is negative.   Physical Exam:  height is 5\' 4"  (1.626 m) and weight is 169 lb 0.6 oz (76.7 kg). Her oral temperature is 98.1 F (36.7 C). Her blood pressure is 130/95 (abnormal) and her pulse is 70. Her respiration is 18 and oxygen saturation is 100%.   Wt Readings from Last 3 Encounters:  06/09/20 169 lb 0.6 oz (76.7 kg)  05/18/20  179 lb 2 oz (81.3 kg)  05/10/20 179 lb 2 oz (81.3 kg)    Ocular: Sclerae unicteric, pupils equal, round and reactive to light Ear-nose-throat: Oropharynx clear, dentition fair Lymphatic: No cervical or supraclavicular adenopathy Lungs no rales or rhonchi, good excursion bilaterally Heart regular rate and rhythm, no murmur appreciated Abd soft, nontender, positive bowel sounds, no liver or spleen tip palpated on exam, no fluid wave  MSK no focal spinal tenderness, no joint edema Neuro: non-focal, well-oriented, appropriate affect Breasts: Deferred   Lab Results  Component Value Date   WBC 4.4 06/09/2020   HGB 13.8  06/09/2020   HCT 44.0 06/09/2020   MCV 92.2 06/09/2020   PLT 314 06/09/2020   Lab Results  Component Value Date   FERRITIN 319 (H) 05/06/2020   IRON 77 05/06/2020   TIBC 286 05/06/2020   UIBC 209 05/06/2020   IRONPCTSAT 27 05/06/2020   Lab Results  Component Value Date   RETICCTPCT 2.1 06/09/2020   RBC 4.77 06/09/2020   RETICCTABS 139.2 12/12/2015   No results found for: KPAFRELGTCHN, LAMBDASER, KAPLAMBRATIO No results found for: Kandis Cocking, [facility] Lab Results  Component Value Date   TOTALPROTELP 6.8 02/28/2012   ALBUMINELP 57.8 02/28/2012   A1GS 4.0 02/28/2012   A2GS 11.7 02/28/2012   BETS 6.7 02/28/2012   BETA2SER 5.2 02/28/2012   GAMS 14.6 02/28/2012   MSPIKE NOT DET 02/28/2012   SPEI * 02/28/2012     Chemistry      Component Value Date/Time   NA 136 05/19/2020 0449   NA 148 (H) 12/19/2017 1355   NA 143 06/28/2017 1114   K 3.6 05/19/2020 0449   K 4.1 12/19/2017 1355   K 4.0 06/28/2017 1114   CL 103 05/19/2020 0449   CL 108 12/19/2017 1355   CO2 23 05/19/2020 0449   CO2 27 12/19/2017 1355   CO2 23 06/28/2017 1114   BUN 10 05/19/2020 0449   BUN 12 12/19/2017 1355   BUN 11.0 06/28/2017 1114   CREATININE 0.55 05/19/2020 0449   CREATININE 0.67 03/02/2020 1423   CREATININE 0.9 12/19/2017 1355   CREATININE 0.7 06/28/2017 1114      Component Value Date/Time   CALCIUM 8.1 (L) 05/19/2020 0449   CALCIUM 9.5 12/19/2017 1355   CALCIUM 9.5 06/28/2017 1114   ALKPHOS 115 03/02/2020 1423   ALKPHOS 88 (H) 12/19/2017 1355   ALKPHOS 88 06/28/2017 1114   AST 13 (L) 03/02/2020 1423   AST 16 06/28/2017 1114   ALT 14 03/02/2020 1423   ALT 16 12/19/2017 1355   ALT 13 06/28/2017 1114   BILITOT 0.3 03/02/2020 1423   BILITOT 0.30 06/28/2017 1114       Impression and Plan: Jamie Mcdowell is a very pleasant59yo caucasian female withiron deficiency anemia. We will see what her iron studies show andreplace if needed prior to surgery. We will see her back in 2  months.  She will contact our office with any questions or concerns. We can certainly see her sooner if needed.  [name], NP 6/17/20213:49 PM

## 2020-06-10 ENCOUNTER — Telehealth: Payer: Self-pay | Admitting: Family

## 2020-06-10 LAB — IRON AND TIBC
Iron: 52 ug/dL (ref 41–142)
Saturation Ratios: 18 % — ABNORMAL LOW (ref 21–57)
TIBC: 290 ug/dL (ref 236–444)
UIBC: 238 ug/dL (ref 120–384)

## 2020-06-10 LAB — FERRITIN: Ferritin: 300 ng/mL (ref 11–307)

## 2020-06-10 NOTE — Telephone Encounter (Signed)
Appointments scheduled calendar printed & mailed per 6/17 los 

## 2020-06-10 NOTE — Telephone Encounter (Signed)
Called and advised patient of appointment for iron infusion scheduled for Monday 6/21.  She agreed to both date& time per 6/18 sch msg

## 2020-06-13 ENCOUNTER — Inpatient Hospital Stay: Payer: Managed Care, Other (non HMO)

## 2020-06-13 ENCOUNTER — Other Ambulatory Visit: Payer: Self-pay

## 2020-06-13 VITALS — BP 149/91 | HR 55 | Temp 97.9°F | Resp 17

## 2020-06-13 DIAGNOSIS — D500 Iron deficiency anemia secondary to blood loss (chronic): Secondary | ICD-10-CM

## 2020-06-13 DIAGNOSIS — D509 Iron deficiency anemia, unspecified: Secondary | ICD-10-CM | POA: Diagnosis not present

## 2020-06-13 MED ORDER — SODIUM CHLORIDE 0.9 % IV SOLN
750.0000 mg | Freq: Once | INTRAVENOUS | Status: AC
  Administered 2020-06-13: 750 mg via INTRAVENOUS
  Filled 2020-06-13: qty 15

## 2020-06-13 NOTE — Patient Instructions (Signed)

## 2020-06-30 ENCOUNTER — Other Ambulatory Visit: Payer: Self-pay | Admitting: Gastroenterology

## 2020-06-30 MED ORDER — PANTOPRAZOLE SODIUM 40 MG PO TBEC
40.0000 mg | DELAYED_RELEASE_TABLET | Freq: Every day | ORAL | 1 refills | Status: DC
Start: 2020-06-30 — End: 2020-08-24

## 2020-06-30 NOTE — Telephone Encounter (Signed)
Patient is on 40mg  daily in the evening and appointment is made for 9-1 at 1:40pm

## 2020-07-05 ENCOUNTER — Other Ambulatory Visit: Payer: Self-pay

## 2020-07-05 ENCOUNTER — Inpatient Hospital Stay: Payer: Managed Care, Other (non HMO) | Attending: Hematology & Oncology

## 2020-07-05 DIAGNOSIS — D649 Anemia, unspecified: Secondary | ICD-10-CM

## 2020-07-05 DIAGNOSIS — D509 Iron deficiency anemia, unspecified: Secondary | ICD-10-CM | POA: Diagnosis not present

## 2020-07-05 DIAGNOSIS — D500 Iron deficiency anemia secondary to blood loss (chronic): Secondary | ICD-10-CM

## 2020-07-05 LAB — CBC WITH DIFFERENTIAL (CANCER CENTER ONLY)
Abs Immature Granulocytes: 0.05 10*3/uL (ref 0.00–0.07)
Basophils Absolute: 0.1 10*3/uL (ref 0.0–0.1)
Basophils Relative: 1 %
Eosinophils Absolute: 0.1 10*3/uL (ref 0.0–0.5)
Eosinophils Relative: 3 %
HCT: 45.5 % (ref 36.0–46.0)
Hemoglobin: 14.6 g/dL (ref 12.0–15.0)
Immature Granulocytes: 1 %
Lymphocytes Relative: 30 %
Lymphs Abs: 1.3 10*3/uL (ref 0.7–4.0)
MCH: 29.1 pg (ref 26.0–34.0)
MCHC: 32.1 g/dL (ref 30.0–36.0)
MCV: 90.8 fL (ref 80.0–100.0)
Monocytes Absolute: 0.3 10*3/uL (ref 0.1–1.0)
Monocytes Relative: 8 %
Neutro Abs: 2.4 10*3/uL (ref 1.7–7.7)
Neutrophils Relative %: 57 %
Platelet Count: 246 10*3/uL (ref 150–400)
RBC: 5.01 MIL/uL (ref 3.87–5.11)
RDW: 13.8 % (ref 11.5–15.5)
WBC Count: 4.3 10*3/uL (ref 4.0–10.5)
nRBC: 0 % (ref 0.0–0.2)

## 2020-07-05 LAB — RETICULOCYTES
Immature Retic Fract: 14 % (ref 2.3–15.9)
RBC.: 4.88 MIL/uL (ref 3.87–5.11)
Retic Count, Absolute: 123 10*3/uL (ref 19.0–186.0)
Retic Ct Pct: 2.5 % (ref 0.4–3.1)

## 2020-07-06 LAB — FERRITIN: Ferritin: 636 ng/mL — ABNORMAL HIGH (ref 11–307)

## 2020-07-06 LAB — IRON AND TIBC
Iron: 105 ug/dL (ref 41–142)
Saturation Ratios: 43 % (ref 21–57)
TIBC: 246 ug/dL (ref 236–444)
UIBC: 141 ug/dL (ref 120–384)

## 2020-07-14 ENCOUNTER — Encounter: Payer: Self-pay | Admitting: Dietician

## 2020-07-14 ENCOUNTER — Encounter: Payer: Managed Care, Other (non HMO) | Attending: Surgery | Admitting: Dietician

## 2020-07-14 ENCOUNTER — Other Ambulatory Visit: Payer: Self-pay

## 2020-07-14 DIAGNOSIS — Z9889 Other specified postprocedural states: Secondary | ICD-10-CM | POA: Insufficient documentation

## 2020-07-14 DIAGNOSIS — Z8719 Personal history of other diseases of the digestive system: Secondary | ICD-10-CM | POA: Insufficient documentation

## 2020-07-14 NOTE — Progress Notes (Signed)
Medical Nutrition Therapy   Primary concerns today: diet progression after esophageal surgery   Referral diagnosis: K44.9-diaphragmatic hernia w/o obstruction or gangrene Preferred learning style: no preference indicated Learning readiness: ready   NUTRITION ASSESSMENT   Anthropometrics  Weight: 169 lbs    Clinical Medical Hx: IBS, diverticulitis, HTN, paraesophageal hernia repair w/ fundoplication (May 9937), iron deficiency anemia  Notable Signs/Symptoms: diarrhea   Receives iron infusion periodically, pt states about every 2 weeks.   Lifestyle & Dietary Hx Patient recently had esophageal hernia repair surgery and has done well with progressing her diet since. States she started on liquids for a few weeks before trying mushy foods. Now, she states she is experimenting with more solid foods. Does well with eggs, potatoes, bacon, oatmeal, bananas, and pasta. Tries to avoid most meat, but incorporating chicken and ground hamburger some. Avoids carbonation. States she usually drinks 2 cups of cold brew coffee per day, however is trying to cut this out because she suspects it is contributing to diarrhea. Typical meal pattern is 3 per day, rarely snacks. States she has tried making a peanut butter banana smoothie a couple of times.   Supplements: none  24-Hr Dietary Recall First Meal: oatmeal + coffee w/ creamer  Snack: - Second Meal: BLT + chips   Snack: white cheddar crackers  Third Meal: spaghetti + bread Snack: - Beverages: coffee, unsweet tea, water  Estimated Energy Needs Calories: 1800 Carbohydrate: 200g Protein: 113g Fat: 60g   NUTRITION DIAGNOSIS  Predicted inadequate nutrient intake (NI-5.11.1) related to recent esophageal surgery restricting intake as evidenced by patient reported diet history reflecting inadequate macro and micronutrient intake compared to estimated needs.    NUTRITION INTERVENTION  Nutrition education (E-1) on the following topics:  . Diet  progression to soft foods following esophageal surgery  . Inclusion of a daily multivitamin and omega-3 supplement  . Importance of small frequent meals throughout the day   Handouts Provided Include   Esophageal Surgery Nutrition Therapy   Learning Style & Readiness for Change Teaching method utilized: Visual & Auditory  Demonstrated degree of understanding via: Teach Back  Barriers to learning/adherence to lifestyle change: None Identified   Goals Established by Pt . Take a multivitamin and omega-3 supplement daily  . Eat at least 3-4 times per day (small meals/snacks) . Include carbohydrates and protein for balance    MONITORING & EVALUATION Dietary intake, weekly physical activity, and goals PRN.  Next Steps  Patient is to contact [facility] for follow up visit. Recommend follow up in ~3 months. Patient states she will call to schedule.

## 2020-07-14 NOTE — Patient Instructions (Addendum)
   Remember to take a multivitamin every day, such as One-a-Day Women's multivitamin.   Consider taking an omega-3 supplement such as Nature Made.   Try to eat at least 3-4 small meals/snacks per day. Remember to get in carbohydrates and protein for balanced energy.   Carbohydrates: grains (rice, pasta, soft breads, oatmeal), fruit, etc.   Protein: eggs, smooth peanut butter, yogurt, etc.

## 2020-08-09 ENCOUNTER — Inpatient Hospital Stay: Payer: Managed Care, Other (non HMO)

## 2020-08-09 ENCOUNTER — Inpatient Hospital Stay: Payer: Managed Care, Other (non HMO) | Admitting: Family

## 2020-08-24 ENCOUNTER — Other Ambulatory Visit: Payer: Self-pay | Admitting: Family

## 2020-08-24 ENCOUNTER — Encounter: Payer: Self-pay | Admitting: Gastroenterology

## 2020-08-24 ENCOUNTER — Ambulatory Visit (INDEPENDENT_AMBULATORY_CARE_PROVIDER_SITE_OTHER): Payer: Managed Care, Other (non HMO) | Admitting: Gastroenterology

## 2020-08-24 VITALS — BP 144/94 | HR 78 | Ht 64.0 in | Wt 166.5 lb

## 2020-08-24 DIAGNOSIS — Z8719 Personal history of other diseases of the digestive system: Secondary | ICD-10-CM

## 2020-08-24 DIAGNOSIS — D500 Iron deficiency anemia secondary to blood loss (chronic): Secondary | ICD-10-CM

## 2020-08-24 DIAGNOSIS — K219 Gastro-esophageal reflux disease without esophagitis: Secondary | ICD-10-CM | POA: Diagnosis not present

## 2020-08-24 DIAGNOSIS — D509 Iron deficiency anemia, unspecified: Secondary | ICD-10-CM

## 2020-08-24 DIAGNOSIS — Z860100 Personal history of colon polyps, unspecified: Secondary | ICD-10-CM

## 2020-08-24 DIAGNOSIS — Z9889 Other specified postprocedural states: Secondary | ICD-10-CM

## 2020-08-24 MED ORDER — PANTOPRAZOLE SODIUM 20 MG PO TBEC
DELAYED_RELEASE_TABLET | ORAL | 0 refills | Status: DC
Start: 2020-08-24 — End: 2020-11-24

## 2020-08-24 NOTE — Patient Instructions (Addendum)
If you are age 57 or older, your body mass index should be between 23-30. Your Body mass index is 28.58 kg/m. If this is out of the aforementioned range listed, please consider follow up with your Primary Care Provider.  If you are age 103 or younger, your body mass index should be between 19-25. Your Body mass index is 28.58 kg/m. If this is out of the aformentioned range listed, please consider follow up with your Primary Care Provider.   We have sent the following medications to your pharmacy for you to pick up at your convenience: Protonix 20 mg - take one daily for two weeks, then take one every other day for two weeks, then Stop.  Follow up with me in one year.  It was a pleasure to see you today!  Vito Cirigliano, D.O.

## 2020-08-24 NOTE — Progress Notes (Signed)
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 Treated with RBC transfusion and IV iron upfront in 2011, and has followed closely in the Hematology clinic since that time. Not responsive to oral iron therapy; treated with serial IV iron infusions Q3-4 weeks maintain baseline Hgb ~10-11.  Evaluated with EGD/colonoscopy in 12/2019, most notable for 9 cm HH with Cameron's ulcers.  Normal duodenal biopsies.  Referred to Dr. Kae Heller at [place] for paraesophageal hernia repair and partial fundoplication completed 12/6107.  Hx of reflux, characterized by HB and regurgitation. Worse at night, eating close to bedtime.  Well controlled with PPI therapy.  No breakthrough symptoms since hiatal hernia repair/fundoplication.   Does have long-standing diarrhea/constipation with previous diagnosis of IBS-mixed type. Not taking any meds or dietary mods. Sxs minimally bothersome for last number of years. Sxs also improved since starting Clonidine years ago. Takes prn Colace when constipated.  Previous seen by Dr. Paulita Fujita at [facility] for [place], last seen 10/17/16.  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   Endoscopic History: -EGD (12/2019, Dr. Bryan Lemma): Large 9 cm HH with Cameron's ulcers, mildly tortuous lower esophagus, non-H. pylori gastritis, benign gastric hyperplastic polyps, normal duodenum (normal biopsies) -Colonoscopy (12/2019, Dr. Bryan Lemma): 5 mm TA, sigmoid diverticulosis, internal hemorrhoids.  Otherwise normal with biopsies negative for MC.  Repeat in 7 years  -VCE (01/2017, Dr. Paulita Fujita): Normal/no bleeding, but small bowel transit time 36 minutes. -Upper GI series (10/2016, Dr. Paulita Fujita): Moderate sized hiatal hernia with 1/3-1/2 of the proximal stomach occupying the lower chest. No obstruction of contrast  flow with prompt gastric emptying. Spontaneous reflux to the level of the thoracic inlet. -EGD (03/2016, Dr. Paulita Fujita): Widely patent Schatzki's ring, large hiatal hernia -Colonoscopy (03/2016, Dr. Paulita Fujita): External hemorrhoids, mild anal stenosis -EGD (2011, Dr. Paulita Fujita): Medium size hiatal hernia, Cameron's erosions. Duodenal biopsies negative for Celiac  HPI:     Patient is a 59 y.o. female presenting to the Gastroenterology Clinic for follow-up.  Last seen by me on 02/12/2020.  Since then, completed robotic paraesophageal hernia repair with toupet fundoplication by Dr. Windle Guard in May 2021.  Today, she states she feels well. Wants to come off the Protonix.  No reflux symptoms and is titrated down to Protonix 40 mg/day.  Tolerating normal diet. Some intermittent bloating, but not terribly bothersome. Lost 13# post op and maintining that weight per her own goal.    Was last seen in the Hematology clinic on 06/09/2020.  Hemoglobin 13.8, MCV 92. Last IV iron was 06/13/20.    07/05/2020: H/H 14.6/45.5, MCV 91, ferritin 636, iron 105, TIBC 246, sat 43%. Has f/u with Hematology with repeat labs tomorrow.    Review of systems:     No chest pain, no SOB, no fevers, no urinary sx   Past Medical History:  Diagnosis Date  . Anemia   . Complication of anesthesia   . Factor V deficiency ([place])   . History of blood transfusion 2019   due to pelvic bone being broken  . History of kidney stones   . Hypertension   . IBS (irritable bowel syndrome)   . Osteoporosis   . PONV (postoperative nausea and vomiting)   . UTI (urinary tract infection)     Patient's surgical history, family medical history, social history, medications and allergies were all reviewed  in Epic    Current Outpatient Medications  Medication Sig Dispense Refill  . acetaminophen (TYLENOL) 500 MG tablet Take 2 tablets (1,000 mg total) by mouth every 6 (six) hours. After 3-4 days, can change to taking only as needed (Patient taking  differently: Take 1,000 mg by mouth as needed. After 3-4 days, can change to taking only as needed) 30 tablet 0  . cloNIDine (CATAPRES) 0.2 MG tablet Take 1 tablet (0.2 mg total) by mouth 2 (two) times daily. (Patient taking differently: Take 0.1-0.2 mg by mouth See admin instructions. Take 0.1 mg by mouth in the morning and 0.2 mg at bedtime) 60 tablet 1  . docusate sodium (COLACE) 100 MG capsule Take 1 capsule (100 mg total) by mouth 2 (two) times daily. 10 capsule 0  . fexofenadine (ALLEGRA) 180 MG tablet Take 180 mg by mouth as needed for allergies or rhinitis.    Marland Kitchen ondansetron (ZOFRAN-ODT) 4 MG disintegrating tablet Take 1 tablet (4 mg total) by mouth every 6 (six) hours as needed for nausea. 20 tablet 0  . pantoprazole (PROTONIX) 40 MG tablet Take 1 tablet (40 mg total) by mouth daily. 90 tablet 1  . Vitamin D, Ergocalciferol, (DRISDOL) 1.25 MG (50000 UT) CAPS capsule Take 1 capsule (50,000 Units total) by mouth 2 (two) times a week. 16 capsule 6   No current facility-administered medications for this visit.    Physical Exam:     BP (!) 144/94   Pulse 78   Ht 5\' 4"  (1.626 m)   Wt 166 lb 8 oz (75.5 kg)   BMI 28.58 kg/m   GENERAL:  Pleasant female in NAD PSYCH: : Cooperative, normal affect NEURO: Alert and oriented x 3, no focal neurologic deficits   IMPRESSION and PLAN:    1) Hiatal hernia now s/p robotic hernia repair 04/2020 2) History of Cameron's ulcers 3) Iron Deficiency Anemia  History of IDA 2/2 Cameron's ulcers as a result of large hiatal hernia/paraesophageal hernia, now s/p robotic assisted repair in 04/2020.  No overt bleeding.  H/H has essentially normalized with excellent response and iron indices. -Scheduled for Hematology follow-up and repeat labs tomorrow -Ermalinda Barrios repeat labs demonstrate normal H/H and good iron indices, obviating the need for any further IV iron infusions now that she is postop -Planning on titrating off Protonix as below  4) GERD -Reflux  essentially resolved after surgery -Will titrate Protonix to 20 mg/day x2 weeks then qod x2 weeks and if no breakthrough symptoms, prn only  5) History of colon polyps -Colonoscopy in 12/2019 with single tubular adenoma -Repeat colonoscopy in 2028 for ongoing polyp surveillance  RTC in 12 months or sooner as needed   Lavena Bullion ,DO, FACG 08/24/2020, 1:38 PM

## 2020-08-25 ENCOUNTER — Encounter: Payer: Self-pay | Admitting: Family

## 2020-08-25 ENCOUNTER — Inpatient Hospital Stay: Payer: Managed Care, Other (non HMO) | Attending: Hematology & Oncology

## 2020-08-25 ENCOUNTER — Other Ambulatory Visit: Payer: Self-pay

## 2020-08-25 ENCOUNTER — Inpatient Hospital Stay (HOSPITAL_BASED_OUTPATIENT_CLINIC_OR_DEPARTMENT_OTHER): Payer: Managed Care, Other (non HMO) | Admitting: Family

## 2020-08-25 VITALS — BP 129/97 | HR 61 | Temp 98.2°F | Resp 18 | Ht 64.0 in | Wt 165.1 lb

## 2020-08-25 DIAGNOSIS — D500 Iron deficiency anemia secondary to blood loss (chronic): Secondary | ICD-10-CM | POA: Diagnosis not present

## 2020-08-25 DIAGNOSIS — D509 Iron deficiency anemia, unspecified: Secondary | ICD-10-CM | POA: Insufficient documentation

## 2020-08-25 LAB — CBC WITH DIFFERENTIAL (CANCER CENTER ONLY)
Abs Immature Granulocytes: 0.07 10*3/uL (ref 0.00–0.07)
Basophils Absolute: 0 10*3/uL (ref 0.0–0.1)
Basophils Relative: 1 %
Eosinophils Absolute: 0.1 10*3/uL (ref 0.0–0.5)
Eosinophils Relative: 2 %
HCT: 44.8 % (ref 36.0–46.0)
Hemoglobin: 14.4 g/dL (ref 12.0–15.0)
Immature Granulocytes: 2 %
Lymphocytes Relative: 27 %
Lymphs Abs: 1.2 10*3/uL (ref 0.7–4.0)
MCH: 28.8 pg (ref 26.0–34.0)
MCHC: 32.1 g/dL (ref 30.0–36.0)
MCV: 89.6 fL (ref 80.0–100.0)
Monocytes Absolute: 0.4 10*3/uL (ref 0.1–1.0)
Monocytes Relative: 9 %
Neutro Abs: 2.8 10*3/uL (ref 1.7–7.7)
Neutrophils Relative %: 59 %
Platelet Count: 420 10*3/uL — ABNORMAL HIGH (ref 150–400)
RBC: 5 MIL/uL (ref 3.87–5.11)
RDW: 15.3 % (ref 11.5–15.5)
WBC Count: 4.6 10*3/uL (ref 4.0–10.5)
nRBC: 0 % (ref 0.0–0.2)

## 2020-08-25 LAB — RETICULOCYTES
Immature Retic Fract: 20 % — ABNORMAL HIGH (ref 2.3–15.9)
RBC.: 4.96 MIL/uL (ref 3.87–5.11)
Retic Count, Absolute: 143.8 10*3/uL (ref 19.0–186.0)
Retic Ct Pct: 2.9 % (ref 0.4–3.1)

## 2020-08-25 NOTE — Progress Notes (Signed)
Hematology and Oncology Follow Up Visit  LAVETA GILKEY [id number] [birth year] 59 y.o. 08/25/2020   Principle Diagnosis:  Recurrent iron deficiency anemia  Current Therapy: IV iron as indicated   Interim History:  Ms. Allard is here today for follow-up. She is doing much better and recuperated nicely from having Covid last month. She does note some occasional fatigue and dry cough.  She has not noted any blood loss. No bruising or petechiae.  She was able to see Dr. Bryan Lemma yesterday and is weaning off of her Protonix. This will help significantly with her ability to absorb iron.  No fever, chills, n/v, rash, dizziness, SOB, chest pain, palpitations, abdominal pain or changes in bowel or bladder habits.  No swelling, tenderness, numbness or tingling in her extremities at this time.  No falls or syncope.  She has maintained a good appetite and is staying well hydrated. Her weight is stable.   ECOG Performance Status: 1 - Symptomatic but completely ambulatory  Medications:  Allergies as of 08/25/2020      Reactions   Penicillins Anaphylaxis   Ciprocinonide [fluocinolone] Other (See Comments)   Unknown reaction   Latex Itching, Rash   Sulfa Antibiotics Rash      Medication List       Accurate as of August 25, 2020 11:09 AM. If you have any questions, ask your nurse or doctor.        acetaminophen 500 MG tablet Commonly known as: TYLENOL Take 2 tablets (1,000 mg total) by mouth every 6 (six) hours. After 3-4 days, can change to taking only as needed What changed:   when to take this  reasons to take this   cloNIDine 0.2 MG tablet Commonly known as: Catapres Take 1 tablet (0.2 mg total) by mouth 2 (two) times daily. What changed:   how much to take  when to take this  additional instructions   docusate sodium 100 MG capsule Commonly known as: COLACE Take 1 capsule (100 mg total) by mouth 2 (two) times daily.   fexofenadine 180 MG tablet Commonly known  as: ALLEGRA Take 180 mg by mouth as needed for allergies or rhinitis.   ondansetron 4 MG disintegrating tablet Commonly known as: ZOFRAN-ODT Take 1 tablet (4 mg total) by mouth every 6 (six) hours as needed for nausea.   pantoprazole 20 MG tablet Commonly known as: Protonix Take 20 mg daily for two weeks, then every other day for two weeks then STOP   Vitamin D (Ergocalciferol) 1.25 MG (50000 UNIT) Caps capsule Commonly known as: DRISDOL Take 1 capsule (50,000 Units total) by mouth 2 (two) times a week.       Allergies:  Allergies  Allergen Reactions   Penicillins Anaphylaxis   Ciprocinonide [Fluocinolone] Other (See Comments)    Unknown reaction   Latex Itching and Rash   Sulfa Antibiotics Rash    Past Medical History, Surgical history, Social history, and Family History were reviewed and updated.  Review of Systems: All other 10 point review of systems is negative.   Physical Exam:  vitals were not taken for this visit.   Wt Readings from Last 3 Encounters:  08/24/20 166 lb 8 oz (75.5 kg)  06/09/20 169 lb 0.6 oz (76.7 kg)  05/18/20 179 lb 2 oz (81.3 kg)    Ocular: Sclerae unicteric, pupils equal, round and reactive to light Ear-nose-throat: Oropharynx clear, dentition fair Lymphatic: No cervical or supraclavicular adenopathy Lungs no rales or rhonchi, good excursion bilaterally Heart regular rate and  rhythm, no murmur appreciated Abd soft, nontender, positive bowel sounds, no liver or spleen tip palpated on exam, no fluid wave MSK no focal spinal tenderness, no joint edema Neuro: non-focal, well-oriented, appropriate affect Breasts: Deferred   Lab Results  Component Value Date   WBC 4.3 07/05/2020   HGB 14.6 07/05/2020   HCT 45.5 07/05/2020   MCV 90.8 07/05/2020   PLT 246 07/05/2020   Lab Results  Component Value Date   FERRITIN 636 (H) 07/05/2020   IRON 105 07/05/2020   TIBC 246 07/05/2020   UIBC 141 07/05/2020   IRONPCTSAT 43 07/05/2020    Lab Results  Component Value Date   RETICCTPCT 2.5 07/05/2020   RBC 5.01 07/05/2020   RBC 4.88 07/05/2020   RETICCTABS 139.2 12/12/2015   No results found for: KPAFRELGTCHN, LAMBDASER, KAPLAMBRATIO No results found for: Kandis Cocking, [facility] Lab Results  Component Value Date   TOTALPROTELP 6.8 02/28/2012   ALBUMINELP 57.8 02/28/2012   A1GS 4.0 02/28/2012   A2GS 11.7 02/28/2012   BETS 6.7 02/28/2012   BETA2SER 5.2 02/28/2012   GAMS 14.6 02/28/2012   MSPIKE NOT DET 02/28/2012   SPEI * 02/28/2012     Chemistry      Component Value Date/Time   NA 136 05/19/2020 0449   NA 148 (H) 12/19/2017 1355   NA 143 06/28/2017 1114   K 3.6 05/19/2020 0449   K 4.1 12/19/2017 1355   K 4.0 06/28/2017 1114   CL 103 05/19/2020 0449   CL 108 12/19/2017 1355   CO2 23 05/19/2020 0449   CO2 27 12/19/2017 1355   CO2 23 06/28/2017 1114   BUN 10 05/19/2020 0449   BUN 12 12/19/2017 1355   BUN 11.0 06/28/2017 1114   CREATININE 0.55 05/19/2020 0449   CREATININE 0.67 03/02/2020 1423   CREATININE 0.9 12/19/2017 1355   CREATININE 0.7 06/28/2017 1114      Component Value Date/Time   CALCIUM 8.1 (L) 05/19/2020 0449   CALCIUM 9.5 12/19/2017 1355   CALCIUM 9.5 06/28/2017 1114   ALKPHOS 115 03/02/2020 1423   ALKPHOS 88 (H) 12/19/2017 1355   ALKPHOS 88 06/28/2017 1114   AST 13 (L) 03/02/2020 1423   AST 16 06/28/2017 1114   ALT 14 03/02/2020 1423   ALT 16 12/19/2017 1355   ALT 13 06/28/2017 1114   BILITOT 0.3 03/02/2020 1423   BILITOT 0.30 06/28/2017 1114       Impression and Plan: Ms. Walter is a very pleasant59yo caucasian female withiron deficiency anemia. We will see how her iron looks and replace if needed.  We will go ahead and plan to see her again in another 3 months.  She can contact our office with any questions or concerns.   [name], NP 9/2/202111:09 AM

## 2020-08-26 LAB — IRON AND TIBC
Iron: 99 ug/dL (ref 41–142)
Saturation Ratios: 39 % (ref 21–57)
TIBC: 253 ug/dL (ref 236–444)
UIBC: 154 ug/dL (ref 120–384)

## 2020-08-26 LAB — FERRITIN: Ferritin: 807 ng/mL — ABNORMAL HIGH (ref 11–307)

## 2020-08-31 ENCOUNTER — Telehealth: Payer: Self-pay | Admitting: *Deleted

## 2020-08-31 NOTE — Telephone Encounter (Signed)
Message received from patient concerned that her ferritin and platelets are high.  Call placed back to patient and message left to notify patient per order of S. [place] NP that "platelet count is only mildly high, which we will keep an eye on and that ferritin is elevated d/t IV iron infusion and also may be a little elevated d/t chronic inflammation in joints/arthritis or pelvic fracture."  Instructed pt to call office back with any further questions or concerns.

## 2020-11-24 ENCOUNTER — Encounter: Payer: Self-pay | Admitting: Hematology & Oncology

## 2020-11-24 ENCOUNTER — Inpatient Hospital Stay: Payer: Managed Care, Other (non HMO) | Attending: Hematology & Oncology

## 2020-11-24 ENCOUNTER — Other Ambulatory Visit: Payer: Self-pay

## 2020-11-24 ENCOUNTER — Inpatient Hospital Stay (HOSPITAL_BASED_OUTPATIENT_CLINIC_OR_DEPARTMENT_OTHER): Payer: Managed Care, Other (non HMO) | Admitting: Hematology & Oncology

## 2020-11-24 VITALS — BP 155/94 | HR 67 | Temp 98.2°F | Resp 18 | Wt 174.0 lb

## 2020-11-24 DIAGNOSIS — D500 Iron deficiency anemia secondary to blood loss (chronic): Secondary | ICD-10-CM | POA: Diagnosis not present

## 2020-11-24 DIAGNOSIS — D509 Iron deficiency anemia, unspecified: Secondary | ICD-10-CM | POA: Insufficient documentation

## 2020-11-24 LAB — CBC WITH DIFFERENTIAL (CANCER CENTER ONLY)
Abs Immature Granulocytes: 0.04 10*3/uL (ref 0.00–0.07)
Basophils Absolute: 0.1 10*3/uL (ref 0.0–0.1)
Basophils Relative: 1 %
Eosinophils Absolute: 0.1 10*3/uL (ref 0.0–0.5)
Eosinophils Relative: 3 %
HCT: 48.6 % — ABNORMAL HIGH (ref 36.0–46.0)
Hemoglobin: 15.5 g/dL — ABNORMAL HIGH (ref 12.0–15.0)
Immature Granulocytes: 1 %
Lymphocytes Relative: 35 %
Lymphs Abs: 1.5 10*3/uL (ref 0.7–4.0)
MCH: 29.7 pg (ref 26.0–34.0)
MCHC: 31.9 g/dL (ref 30.0–36.0)
MCV: 93.1 fL (ref 80.0–100.0)
Monocytes Absolute: 0.4 10*3/uL (ref 0.1–1.0)
Monocytes Relative: 9 %
Neutro Abs: 2.1 10*3/uL (ref 1.7–7.7)
Neutrophils Relative %: 51 %
Platelet Count: 285 10*3/uL (ref 150–400)
RBC: 5.22 MIL/uL — ABNORMAL HIGH (ref 3.87–5.11)
RDW: 13 % (ref 11.5–15.5)
WBC Count: 4.2 10*3/uL (ref 4.0–10.5)
nRBC: 0 % (ref 0.0–0.2)

## 2020-11-24 LAB — RETICULOCYTES
Immature Retic Fract: 13.2 % (ref 2.3–15.9)
RBC.: 5.19 MIL/uL — ABNORMAL HIGH (ref 3.87–5.11)
Retic Count, Absolute: 138.1 10*3/uL (ref 19.0–186.0)
Retic Ct Pct: 2.7 % (ref 0.4–3.1)

## 2020-11-24 MED ORDER — TRIAMTERENE-HCTZ 75-50 MG PO TABS: 1.0000 | ORAL_TABLET | Freq: Every day | ORAL | 4 refills | Status: DC

## 2020-11-24 NOTE — Progress Notes (Signed)
Hematology and Oncology Follow Up Visit  Jamie Mcdowell [id number] February 26, 1961 59 y.o. 11/24/2020   Principle Diagnosis:  Recurrent iron deficiency anemia  Current Therapy: IV iron as indicated   Interim History:  Jamie Mcdowell is here today for follow-up.  She is really doing well.  She underwent hiatal hernia repair back in May.  This really has helped her out quite a bit.  Her hemoglobin is the best that it has ever been.  She has had no problems with fatigue or weakness.  Her blood pressure continues to be a problem.  She is on clonidine.  We cannot increase the clonidine dose because it makes her too tired.  We will add Maxide (75/50) and see if this cannot help a little bit.  She has had no problems with bowels or bladder.  She has had no leg swelling.  There is been no rashes.  She had the COVID I think back in August.  She got over this.  She has had no fever.  There is been no headache.  She is retired now and she is enjoying retirement.  Currently, her performance status is ECOG 0.   Medications:  Allergies as of 11/24/2020      Reactions   Penicillins Anaphylaxis   Ciprocinonide [fluocinolone] Other (See Comments)   Unknown reaction   Latex Itching, Rash   Sulfa Antibiotics Rash      Medication List       Accurate as of November 24, 2020  1:12 PM. If you have any questions, ask your nurse or doctor.        STOP taking these medications   pantoprazole 20 MG tablet Commonly known as: Protonix Stopped by: Volanda Napoleon, MD     TAKE these medications   acetaminophen 500 MG tablet Commonly known as: TYLENOL Take 2 tablets (1,000 mg total) by mouth every 6 (six) hours. After 3-4 days, can change to taking only as needed What changed:   when to take this  reasons to take this   cloNIDine 0.2 MG tablet Commonly known as: Catapres Take 1 tablet (0.2 mg total) by mouth 2 (two) times daily. What changed:   how much to take  when to take  this  additional instructions   docusate sodium 100 MG capsule Commonly known as: COLACE Take 1 capsule (100 mg total) by mouth 2 (two) times daily.   fexofenadine 180 MG tablet Commonly known as: ALLEGRA Take 180 mg by mouth as needed for allergies or rhinitis.   nitrofurantoin (macrocrystal-monohydrate) 100 MG capsule Commonly known as: MACROBID Take 100 mg by mouth daily.   ondansetron 4 MG disintegrating tablet Commonly known as: ZOFRAN-ODT Take 1 tablet (4 mg total) by mouth every 6 (six) hours as needed for nausea.   Vitamin D (Ergocalciferol) 1.25 MG (50000 UNIT) Caps capsule Commonly known as: DRISDOL Take 1 capsule (50,000 Units total) by mouth 2 (two) times a week.       Allergies:  Allergies  Allergen Reactions   Penicillins Anaphylaxis   Ciprocinonide [Fluocinolone] Other (See Comments)    Unknown reaction   Latex Itching and Rash   Sulfa Antibiotics Rash    Past Medical History, Surgical history, Social history, and Family History were reviewed and updated.  Review of Systems: All other 10 point review of systems is negative.   Physical Exam:  weight is 174 lb (78.9 kg). Her oral temperature is 98.2 F (36.8 C). Her blood pressure is 155/94 (abnormal) and her  pulse is 67. Her respiration is 18 and oxygen saturation is 99%.   Wt Readings from Last 3 Encounters:  11/24/20 174 lb (78.9 kg)  08/25/20 165 lb 1.9 oz (74.9 kg)  08/24/20 166 lb 8 oz (75.5 kg)    Physical Exam Vitals reviewed.  HENT:     Head: Normocephalic and atraumatic.  Eyes:     Pupils: Pupils are equal, round, and reactive to light.  Cardiovascular:     Rate and Rhythm: Normal rate and regular rhythm.     Heart sounds: Normal heart sounds.  Pulmonary:     Effort: Pulmonary effort is normal.     Breath sounds: Normal breath sounds.  Abdominal:     General: Bowel sounds are normal.     Palpations: Abdomen is soft.  Musculoskeletal:        General: No tenderness or  deformity. Normal range of motion.     Cervical back: Normal range of motion.  Lymphadenopathy:     Cervical: No cervical adenopathy.  Skin:    General: Skin is warm and dry.     Findings: No erythema or rash.  Neurological:     Mental Status: She is alert and oriented to person, place, and time.  Psychiatric:        Behavior: Behavior normal.        Thought Content: Thought content normal.        Judgment: Judgment normal.      Lab Results  Component Value Date   WBC 4.2 11/24/2020   HGB 15.5 (H) 11/24/2020   HCT 48.6 (H) 11/24/2020   MCV 93.1 11/24/2020   PLT 285 11/24/2020   Lab Results  Component Value Date   FERRITIN 807 (H) 08/25/2020   IRON 99 08/25/2020   TIBC 253 08/25/2020   UIBC 154 08/25/2020   IRONPCTSAT 39 08/25/2020   Lab Results  Component Value Date   RETICCTPCT 2.7 11/24/2020   RBC 5.22 (H) 11/24/2020   RETICCTABS 139.2 12/12/2015   No results found for: KPAFRELGTCHN, LAMBDASER, KAPLAMBRATIO No results found for: Kandis Cocking, [facility] Lab Results  Component Value Date   TOTALPROTELP 6.8 02/28/2012   ALBUMINELP 57.8 02/28/2012   A1GS 4.0 02/28/2012   A2GS 11.7 02/28/2012   BETS 6.7 02/28/2012   BETA2SER 5.2 02/28/2012   GAMS 14.6 02/28/2012   MSPIKE NOT DET 02/28/2012   SPEI * 02/28/2012     Chemistry      Component Value Date/Time   NA 136 05/19/2020 0449   NA 148 (H) 12/19/2017 1355   NA 143 06/28/2017 1114   Mcdowell 3.6 05/19/2020 0449   Mcdowell 4.1 12/19/2017 1355   Mcdowell 4.0 06/28/2017 1114   CL 103 05/19/2020 0449   CL 108 12/19/2017 1355   CO2 23 05/19/2020 0449   CO2 27 12/19/2017 1355   CO2 23 06/28/2017 1114   BUN 10 05/19/2020 0449   BUN 12 12/19/2017 1355   BUN 11.0 06/28/2017 1114   CREATININE 0.55 05/19/2020 0449   CREATININE 0.67 03/02/2020 1423   CREATININE 0.9 12/19/2017 1355   CREATININE 0.7 06/28/2017 1114      Component Value Date/Time   CALCIUM 8.1 (L) 05/19/2020 0449   CALCIUM 9.5 12/19/2017 1355   CALCIUM 9.5  06/28/2017 1114   ALKPHOS 115 03/02/2020 1423   ALKPHOS 88 (H) 12/19/2017 1355   ALKPHOS 88 06/28/2017 1114   AST 13 (L) 03/02/2020 1423   AST 16 06/28/2017 1114   ALT 14 03/02/2020 1423  ALT 16 12/19/2017 1355   ALT 13 06/28/2017 1114   BILITOT 0.3 03/02/2020 1423   BILITOT 0.30 06/28/2017 1114       Impression and Plan: Ms. Kelch is a very pleasant59yo caucasian female withiron deficiency anemia. She had hiatal hernia repair.  This is helped.  Her hemoglobin is fantastic.  I would have to believe that her iron should be okay.  We will plan for 55-month follow-up.  It is always fun to see her.  She is such a [place] woman.  We always have good fellowship.  Volanda Napoleon, MD 12/2/20211:12 PM

## 2020-11-25 LAB — FERRITIN: Ferritin: 426 ng/mL — ABNORMAL HIGH (ref 11–307)

## 2020-11-25 LAB — IRON AND TIBC
Iron: 104 ug/dL (ref 41–142)
Saturation Ratios: 37 % (ref 21–57)
TIBC: 283 ug/dL (ref 236–444)
UIBC: 179 ug/dL (ref 120–384)

## 2021-02-22 ENCOUNTER — Inpatient Hospital Stay: Payer: 59 | Attending: Hematology & Oncology

## 2021-02-22 ENCOUNTER — Other Ambulatory Visit: Payer: Self-pay | Admitting: Family

## 2021-02-22 ENCOUNTER — Inpatient Hospital Stay (HOSPITAL_BASED_OUTPATIENT_CLINIC_OR_DEPARTMENT_OTHER): Payer: 59 | Admitting: Family

## 2021-02-22 ENCOUNTER — Other Ambulatory Visit: Payer: Self-pay

## 2021-02-22 ENCOUNTER — Encounter: Payer: Self-pay | Admitting: Family

## 2021-02-22 VITALS — BP 165/113 | HR 60 | Temp 97.9°F | Resp 18 | Ht 61.0 in | Wt 177.1 lb

## 2021-02-22 DIAGNOSIS — R140 Abdominal distension (gaseous): Secondary | ICD-10-CM | POA: Diagnosis not present

## 2021-02-22 DIAGNOSIS — Z882 Allergy status to sulfonamides status: Secondary | ICD-10-CM | POA: Diagnosis not present

## 2021-02-22 DIAGNOSIS — Z79899 Other long term (current) drug therapy: Secondary | ICD-10-CM | POA: Diagnosis not present

## 2021-02-22 DIAGNOSIS — D500 Iron deficiency anemia secondary to blood loss (chronic): Secondary | ICD-10-CM | POA: Diagnosis not present

## 2021-02-22 DIAGNOSIS — Z880 Allergy status to penicillin: Secondary | ICD-10-CM | POA: Diagnosis not present

## 2021-02-22 DIAGNOSIS — I10 Essential (primary) hypertension: Secondary | ICD-10-CM

## 2021-02-22 DIAGNOSIS — D509 Iron deficiency anemia, unspecified: Secondary | ICD-10-CM | POA: Diagnosis not present

## 2021-02-22 DIAGNOSIS — E559 Vitamin D deficiency, unspecified: Secondary | ICD-10-CM | POA: Diagnosis not present

## 2021-02-22 DIAGNOSIS — R519 Headache, unspecified: Secondary | ICD-10-CM | POA: Insufficient documentation

## 2021-02-22 DIAGNOSIS — R42 Dizziness and giddiness: Secondary | ICD-10-CM | POA: Diagnosis not present

## 2021-02-22 LAB — CBC WITH DIFFERENTIAL (CANCER CENTER ONLY)
Abs Immature Granulocytes: 0.05 10*3/uL (ref 0.00–0.07)
Basophils Absolute: 0.1 10*3/uL (ref 0.0–0.1)
Basophils Relative: 1 %
Eosinophils Absolute: 0.1 10*3/uL (ref 0.0–0.5)
Eosinophils Relative: 1 %
HCT: 47.6 % — ABNORMAL HIGH (ref 36.0–46.0)
Hemoglobin: 15.8 g/dL — ABNORMAL HIGH (ref 12.0–15.0)
Immature Granulocytes: 1 %
Lymphocytes Relative: 28 %
Lymphs Abs: 1.6 10*3/uL (ref 0.7–4.0)
MCH: 30 pg (ref 26.0–34.0)
MCHC: 33.2 g/dL (ref 30.0–36.0)
MCV: 90.5 fL (ref 80.0–100.0)
Monocytes Absolute: 0.3 10*3/uL (ref 0.1–1.0)
Monocytes Relative: 6 %
Neutro Abs: 3.6 10*3/uL (ref 1.7–7.7)
Neutrophils Relative %: 63 %
Platelet Count: 275 10*3/uL (ref 150–400)
RBC: 5.26 MIL/uL — ABNORMAL HIGH (ref 3.87–5.11)
RDW: 13 % (ref 11.5–15.5)
WBC Count: 5.7 10*3/uL (ref 4.0–10.5)
nRBC: 0 % (ref 0.0–0.2)

## 2021-02-22 LAB — RETICULOCYTES
Immature Retic Fract: 12.5 % (ref 2.3–15.9)
RBC.: 5.25 MIL/uL — ABNORMAL HIGH (ref 3.87–5.11)
Retic Count, Absolute: 146.5 10*3/uL (ref 19.0–186.0)
Retic Ct Pct: 2.8 % (ref 0.4–3.1)

## 2021-02-22 LAB — CMP (CANCER CENTER ONLY)
ALT: 16 U/L (ref 0–44)
AST: 16 U/L (ref 15–41)
Albumin: 4.8 g/dL (ref 3.5–5.0)
Alkaline Phosphatase: 105 U/L (ref 38–126)
Anion gap: 10 (ref 5–15)
BUN: 16 mg/dL (ref 6–20)
CO2: 27 mmol/L (ref 22–32)
Calcium: 10.7 mg/dL — ABNORMAL HIGH (ref 8.9–10.3)
Chloride: 106 mmol/L (ref 98–111)
Creatinine: 0.68 mg/dL (ref 0.44–1.00)
GFR, Estimated: >60 mL/min (ref >60)
Glucose, Bld: 100 mg/dL — ABNORMAL HIGH (ref 70–99)
Potassium: 4.5 mmol/L (ref 3.5–5.1)
Sodium: 143 mmol/L (ref 135–145)
Total Bilirubin: 0.4 mg/dL (ref 0.3–1.2)
Total Protein: 7.5 g/dL (ref 6.5–8.1)

## 2021-02-22 LAB — VITAMIN D 25 HYDROXY (VIT D DEFICIENCY, FRACTURES): Vit D, 25-Hydroxy: 51.6 ng/mL (ref 30–100)

## 2021-02-22 MED ORDER — TRIAMTERENE-HCTZ 75-50 MG PO TABS: 0.5000 | ORAL_TABLET | Freq: Every day | ORAL | 4 refills | Status: DC

## 2021-02-22 NOTE — Progress Notes (Signed)
Hematology and Oncology Follow Up Visit  Jamie Mcdowell [id number] [birth year] 60 y.o. 02/22/2021   Principle Diagnosis:  Recurrent iron deficiency anemia  Current Therapy: IV iron as indicated   Interim History:  Jamie Mcdowell is here today for follow-up. Jamie Mcdowell is doing fairly well but has not been taking her Maxzide along with her Catapres due to dizziness. Jamie Mcdowell only took for one day and stopped due to the side effect. Jamie Mcdowell was feeling stressed due to her visit today. Jamie Mcdowell states that Jamie Mcdowell is always worried about her lab work when Jamie Mcdowell comes to see [place]. BP is 165/113. Jamie Mcdowell states that Jamie Mcdowell wants to change to another PCP as her current PCP has not addressed her hypertension. Jamie Mcdowell plans to contact Jamie Mcdowell and request a new patient appointment.  Jamie Mcdowell has not noted any blood loss. No abnormal bruising, no petechiae.  Jamie Mcdowell has had some fatigue and lightheadedness. Jamie Mcdowell also notes mild headaches at times.  No blurred or loss of vision.  No fever, chills, n/v, cough, rash, SOB, chest pain, palpitations, abdominal pain or changes in bowel or bladder habits.  Jamie Mcdowell has some abdominal bloating at times. This comes and goes.  No swelling, tenderness, numbness or tingling in her extremities at this time.  No falls or syncope to report.  Jamie Mcdowell has maintained a good appetite and is staying well hydrated. Her weight is stable.   ECOG Performance Status: 1 - Symptomatic but completely ambulatory  Medications:  Allergies as of 02/22/2021      Reactions   Penicillins Anaphylaxis   Ciprocinonide [fluocinolone] Other (See Comments)   Unknown reaction   Latex Itching, Rash   Sulfa Antibiotics Rash      Medication List       Accurate as of February 22, 2021  1:32 PM. If you have any questions, ask your nurse or doctor.        acetaminophen 500 MG tablet Commonly known as: TYLENOL Take 2 tablets (1,000 mg total) by mouth every 6 (six) hours. After 3-4 days, can change to taking only as needed What changed:    when to take this  reasons to take this   cloNIDine 0.2 MG tablet Commonly known as: Catapres Take 1 tablet (0.2 mg total) by mouth 2 (two) times daily. What changed:   how much to take  when to take this  additional instructions   docusate sodium 100 MG capsule Commonly known as: COLACE Take 1 capsule (100 mg total) by mouth 2 (two) times daily.   fexofenadine 180 MG tablet Commonly known as: ALLEGRA Take 180 mg by mouth as needed for allergies or rhinitis.   nitrofurantoin (macrocrystal-monohydrate) 100 MG capsule Commonly known as: MACROBID Take 100 mg by mouth daily.   ondansetron 4 MG disintegrating tablet Commonly known as: ZOFRAN-ODT Take 1 tablet (4 mg total) by mouth every 6 (six) hours as needed for nausea.   triamterene-hydrochlorothiazide 75-50 MG tablet Commonly known as: Maxzide Take 1 tablet by mouth daily.   Vitamin D (Ergocalciferol) 1.25 MG (50000 UNIT) Caps capsule Commonly known as: DRISDOL Take 1 capsule (50,000 Units total) by mouth 2 (two) times a week.       Allergies:  Allergies  Allergen Reactions  . Penicillins Anaphylaxis  . Ciprocinonide [Fluocinolone] Other (See Comments)    Unknown reaction  . Latex Itching and Rash  . Sulfa Antibiotics Rash    Past Medical History, Surgical history, Social history, and Family History were reviewed and updated.  Review of  Systems: All other 10 point review of systems is negative.   Physical Exam:  vitals were not taken for this visit.   Wt Readings from Last 3 Encounters:  11/24/20 174 lb (78.9 kg)  08/25/20 165 lb 1.9 oz (74.9 kg)  08/24/20 166 lb 8 oz (75.5 kg)    Ocular: Sclerae unicteric, pupils equal, round and reactive to light Ear-nose-throat: Oropharynx clear, dentition fair Lymphatic: No cervical or supraclavicular adenopathy Lungs no rales or rhonchi, good excursion bilaterally Heart regular rate and rhythm, no murmur appreciated Abd soft, nontender, positive bowel  sounds MSK no focal spinal tenderness, no joint edema Neuro: non-focal, well-oriented, appropriate affect Breasts: Deferred   Lab Results  Component Value Date   WBC 4.2 11/24/2020   HGB 15.5 (H) 11/24/2020   HCT 48.6 (H) 11/24/2020   MCV 93.1 11/24/2020   PLT 285 11/24/2020   Lab Results  Component Value Date   FERRITIN 426 (H) 11/24/2020   IRON 104 11/24/2020   TIBC 283 11/24/2020   UIBC 179 11/24/2020   IRONPCTSAT 37 11/24/2020   Lab Results  Component Value Date   RETICCTPCT 2.7 11/24/2020   RBC 5.22 (H) 11/24/2020   RETICCTABS 139.2 12/12/2015   No results found for: KPAFRELGTCHN, LAMBDASER, KAPLAMBRATIO No results found for: Kandis Cocking, [facility] Lab Results  Component Value Date   TOTALPROTELP 6.8 02/28/2012   ALBUMINELP 57.8 02/28/2012   A1GS 4.0 02/28/2012   A2GS 11.7 02/28/2012   BETS 6.7 02/28/2012   BETA2SER 5.2 02/28/2012   GAMS 14.6 02/28/2012   MSPIKE NOT DET 02/28/2012   SPEI * 02/28/2012     Chemistry      Component Value Date/Time   NA 136 05/19/2020 0449   NA 148 (H) 12/19/2017 1355   NA 143 06/28/2017 1114   K 3.6 05/19/2020 0449   K 4.1 12/19/2017 1355   K 4.0 06/28/2017 1114   CL 103 05/19/2020 0449   CL 108 12/19/2017 1355   CO2 23 05/19/2020 0449   CO2 27 12/19/2017 1355   CO2 23 06/28/2017 1114   BUN 10 05/19/2020 0449   BUN 12 12/19/2017 1355   BUN 11.0 06/28/2017 1114   CREATININE 0.55 05/19/2020 0449   CREATININE 0.67 03/02/2020 1423   CREATININE 0.9 12/19/2017 1355   CREATININE 0.7 06/28/2017 1114      Component Value Date/Time   CALCIUM 8.1 (L) 05/19/2020 0449   CALCIUM 9.5 12/19/2017 1355   CALCIUM 9.5 06/28/2017 1114   ALKPHOS 115 03/02/2020 1423   ALKPHOS 88 (H) 12/19/2017 1355   ALKPHOS 88 06/28/2017 1114   AST 13 (L) 03/02/2020 1423   AST 16 06/28/2017 1114   ALT 14 03/02/2020 1423   ALT 16 12/19/2017 1355   ALT 13 06/28/2017 1114   BILITOT 0.3 03/02/2020 1423   BILITOT 0.30 06/28/2017 1114        Impression and Plan:  Jamie Mcdowell is a very pleasant59yo caucasian female with history ofiron deficiency anemia. Iron studies are pending. We will replace if needed.  Jamie Mcdowell will try halving her Maxzide and see if this helps reduce the dizziness. We discussed the importance of her seeking proper treatment for HTN and that Jamie Mcdowell is high risk for stroke.  Jamie Mcdowell plans to contact [place] today for new patient appointment. Follow-up in 4 months.  Jamie Mcdowell was encouraged to contact our office with any questions or concerns.   [name], NP 3/2/20221:32 PM

## 2021-02-23 ENCOUNTER — Telehealth: Payer: Self-pay

## 2021-02-23 LAB — IRON AND TIBC
Iron: 105 ug/dL (ref 41–142)
Saturation Ratios: 35 % (ref 21–57)
TIBC: 304 ug/dL (ref 236–444)
UIBC: 199 ug/dL (ref 120–384)

## 2021-02-23 LAB — FERRITIN: Ferritin: 415 ng/mL — ABNORMAL HIGH (ref 11–307)

## 2021-02-23 NOTE — Telephone Encounter (Signed)
Called and left a vm with f/u appt per 02/22/21 los     anne

## 2021-03-23 ENCOUNTER — Other Ambulatory Visit: Payer: Self-pay | Admitting: Family Medicine

## 2021-03-23 DIAGNOSIS — E2839 Other primary ovarian failure: Secondary | ICD-10-CM

## 2021-04-13 ENCOUNTER — Other Ambulatory Visit: Payer: Self-pay | Admitting: Family Medicine

## 2021-04-13 DIAGNOSIS — E2839 Other primary ovarian failure: Secondary | ICD-10-CM

## 2021-05-10 ENCOUNTER — Other Ambulatory Visit: Payer: Self-pay | Admitting: Family

## 2021-05-10 DIAGNOSIS — D500 Iron deficiency anemia secondary to blood loss (chronic): Secondary | ICD-10-CM

## 2021-07-05 ENCOUNTER — Encounter: Payer: Self-pay | Admitting: Family

## 2021-07-05 ENCOUNTER — Inpatient Hospital Stay: Payer: 59 | Attending: Hematology & Oncology

## 2021-07-05 ENCOUNTER — Other Ambulatory Visit: Payer: Self-pay

## 2021-07-05 ENCOUNTER — Other Ambulatory Visit: Payer: Self-pay | Admitting: Family

## 2021-07-05 ENCOUNTER — Inpatient Hospital Stay (HOSPITAL_BASED_OUTPATIENT_CLINIC_OR_DEPARTMENT_OTHER): Payer: 59 | Admitting: Family

## 2021-07-05 VITALS — BP 150/99 | HR 61 | Temp 98.3°F | Resp 17 | Ht 61.0 in | Wt 179.0 lb

## 2021-07-05 DIAGNOSIS — D500 Iron deficiency anemia secondary to blood loss (chronic): Secondary | ICD-10-CM | POA: Diagnosis not present

## 2021-07-05 DIAGNOSIS — E559 Vitamin D deficiency, unspecified: Secondary | ICD-10-CM

## 2021-07-05 DIAGNOSIS — D509 Iron deficiency anemia, unspecified: Secondary | ICD-10-CM | POA: Diagnosis not present

## 2021-07-05 DIAGNOSIS — Z79899 Other long term (current) drug therapy: Secondary | ICD-10-CM | POA: Diagnosis not present

## 2021-07-05 DIAGNOSIS — R42 Dizziness and giddiness: Secondary | ICD-10-CM | POA: Insufficient documentation

## 2021-07-05 LAB — CMP (CANCER CENTER ONLY)
ALT: 20 U/L (ref 0–44)
AST: 16 U/L (ref 15–41)
Albumin: 4.7 g/dL (ref 3.5–5.0)
Alkaline Phosphatase: 105 U/L (ref 38–126)
Anion gap: 10 (ref 5–15)
BUN: 17 mg/dL (ref 6–20)
CO2: 27 mmol/L (ref 22–32)
Calcium: 10.3 mg/dL (ref 8.9–10.3)
Chloride: 103 mmol/L (ref 98–111)
Creatinine: 0.74 mg/dL (ref 0.44–1.00)
GFR, Estimated: >60 mL/min (ref >60)
Glucose, Bld: 94 mg/dL (ref 70–99)
Potassium: 4.2 mmol/L (ref 3.5–5.1)
Sodium: 140 mmol/L (ref 135–145)
Total Bilirubin: 0.4 mg/dL (ref 0.3–1.2)
Total Protein: 7.3 g/dL (ref 6.5–8.1)

## 2021-07-05 LAB — CBC WITH DIFFERENTIAL (CANCER CENTER ONLY)
Abs Immature Granulocytes: 0.02 10*3/uL (ref 0.00–0.07)
Basophils Absolute: 0 10*3/uL (ref 0.0–0.1)
Basophils Relative: 1 %
Eosinophils Absolute: 0.1 10*3/uL (ref 0.0–0.5)
Eosinophils Relative: 2 %
HCT: 47.7 % — ABNORMAL HIGH (ref 36.0–46.0)
Hemoglobin: 15.4 g/dL — ABNORMAL HIGH (ref 12.0–15.0)
Immature Granulocytes: 0 %
Lymphocytes Relative: 34 %
Lymphs Abs: 1.6 10*3/uL (ref 0.7–4.0)
MCH: 29.4 pg (ref 26.0–34.0)
MCHC: 32.3 g/dL (ref 30.0–36.0)
MCV: 91 fL (ref 80.0–100.0)
Monocytes Absolute: 0.4 10*3/uL (ref 0.1–1.0)
Monocytes Relative: 9 %
Neutro Abs: 2.5 10*3/uL (ref 1.7–7.7)
Neutrophils Relative %: 54 %
Platelet Count: 289 10*3/uL (ref 150–400)
RBC: 5.24 MIL/uL — ABNORMAL HIGH (ref 3.87–5.11)
RDW: 13.1 % (ref 11.5–15.5)
WBC Count: 4.7 10*3/uL (ref 4.0–10.5)
nRBC: 0 % (ref 0.0–0.2)

## 2021-07-05 LAB — RETICULOCYTES
Immature Retic Fract: 18.3 % — ABNORMAL HIGH (ref 2.3–15.9)
RBC.: 5.25 MIL/uL — ABNORMAL HIGH (ref 3.87–5.11)
Retic Count, Absolute: 148.6 10*3/uL (ref 19.0–186.0)
Retic Ct Pct: 2.8 % (ref 0.4–3.1)

## 2021-07-05 LAB — VITAMIN D 25 HYDROXY (VIT D DEFICIENCY, FRACTURES): Vit D, 25-Hydroxy: 37.22 ng/mL (ref 30–100)

## 2021-07-05 NOTE — Progress Notes (Signed)
Hematology and Oncology Follow Up Visit  Jamie Mcdowell [id number] January 16, 1961 60 y.o. 07/05/2021   Principle Diagnosis:  Recurrent iron deficiency anemia   Current Therapy:        IV iron as indicated   Interim History:  Jamie Mcdowell is here today for follow-up. She is doing well but did have two falls recently. She had some lightheadedness at the time and stumbled unto her knees. Thankfully she just had some bruising and was not seriously injured.  No syncope.  She has occasional mild SOB with over exertion.  No fever, chills, n/v, cough, rash, chest pain, palpitations, abdominal pain or changes in bowel  or bladder habits.  She takes a stool softener as needed to avoid constipation.  No blood loss noted. No bruising or petechiae.  No numbness or tingling in her extremities at this time.  Pedal pulses are 3+.  She has maintained a good appetite and is staying well hydrated. Her weight is stable at 179 lbs.   ECOG Performance Status: 0 - Asymptomatic  Medications:  Allergies as of 07/05/2021       Reactions   Penicillins Anaphylaxis   Ciprocinonide [fluocinolone] Other (See Comments)   Unknown reaction   Latex Itching, Rash   Sulfa Antibiotics Rash        Medication List        Accurate as of July 05, 2021  2:40 PM. If you have any questions, ask your nurse or doctor.          STOP taking these medications    triamterene-hydrochlorothiazide 75-50 MG tablet Commonly known as: Maxzide Stopped by: [name], NP       TAKE these medications    acetaminophen 500 MG tablet Commonly known as: TYLENOL Take 2 tablets (1,000 mg total) by mouth every 6 (six) hours. After 3-4 days, can change to taking only as needed What changed:  when to take this reasons to take this   cloNIDine 0.2 MG tablet Commonly known as: Catapres Take 1 tablet (0.2 mg total) by mouth 2 (two) times daily. What changed:  how much to take when to take this additional instructions    docusate sodium 100 MG capsule Commonly known as: COLACE Take 1 capsule (100 mg total) by mouth 2 (two) times daily.   fexofenadine 180 MG tablet Commonly known as: ALLEGRA Take 180 mg by mouth as needed for allergies or rhinitis.   ondansetron 4 MG disintegrating tablet Commonly known as: ZOFRAN-ODT Take 1 tablet (4 mg total) by mouth every 6 (six) hours as needed for nausea.   Vitamin D (Ergocalciferol) 1.25 MG (50000 UNIT) Caps capsule Commonly known as: DRISDOL TAKE 1 CAPSULE BY MOUTH TWICE A WEEK        Allergies:  Allergies  Allergen Reactions   Penicillins Anaphylaxis   Ciprocinonide [Fluocinolone] Other (See Comments)    Unknown reaction   Latex Itching and Rash   Sulfa Antibiotics Rash    Past Medical History, Surgical history, Social history, and Family History were reviewed and updated.  Review of Systems: All other 10 point review of systems is negative.   Physical Exam:  height is 5\' 1"  (1.549 m) and weight is 179 lb (81.2 kg). Her oral temperature is 98.3 F (36.8 C). Her blood pressure is 150/99 (abnormal) and her pulse is 61. Her respiration is 17 and oxygen saturation is 99%.   Wt Readings from Last 3 Encounters:  07/05/21 179 lb (81.2 kg)  02/22/21 177 lb 1.9  oz (80.3 kg)  11/24/20 174 lb (78.9 kg)    Ocular: Sclerae unicteric, pupils equal, round and reactive to light Ear-nose-throat: Oropharynx clear, dentition fair Lymphatic: No cervical or supraclavicular adenopathy Lungs no rales or rhonchi, good excursion bilaterally Heart regular rate and rhythm, no murmur appreciated Abd soft, nontender, positive bowel sounds MSK no focal spinal tenderness, no joint edema Neuro: non-focal, well-oriented, appropriate affect Breasts: Deferred   Lab Results  Component Value Date   WBC 4.7 07/05/2021   HGB 15.4 (H) 07/05/2021   HCT 47.7 (H) 07/05/2021   MCV 91.0 07/05/2021   PLT 289 07/05/2021   Lab Results  Component Value Date   FERRITIN 415  (H) 02/22/2021   IRON 105 02/22/2021   TIBC 304 02/22/2021   UIBC 199 02/22/2021   IRONPCTSAT 35 02/22/2021   Lab Results  Component Value Date   RETICCTPCT 2.8 07/05/2021   RBC 5.25 (H) 07/05/2021   RBC 5.24 (H) 07/05/2021   RETICCTABS 139.2 12/12/2015   No results found for: KPAFRELGTCHN, LAMBDASER, KAPLAMBRATIO No results found for: Kandis Cocking, [facility] Lab Results  Component Value Date   TOTALPROTELP 6.8 02/28/2012   ALBUMINELP 57.8 02/28/2012   A1GS 4.0 02/28/2012   A2GS 11.7 02/28/2012   BETS 6.7 02/28/2012   BETA2SER 5.2 02/28/2012   GAMS 14.6 02/28/2012   MSPIKE NOT DET 02/28/2012   SPEI * 02/28/2012     Chemistry      Component Value Date/Time   NA 140 07/05/2021 1308   NA 148 (H) 12/19/2017 1355   NA 143 06/28/2017 1114   K 4.2 07/05/2021 1308   K 4.1 12/19/2017 1355   K 4.0 06/28/2017 1114   CL 103 07/05/2021 1308   CL 108 12/19/2017 1355   CO2 27 07/05/2021 1308   CO2 27 12/19/2017 1355   CO2 23 06/28/2017 1114   BUN 17 07/05/2021 1308   BUN 12 12/19/2017 1355   BUN 11.0 06/28/2017 1114   CREATININE 0.74 07/05/2021 1308   CREATININE 0.9 12/19/2017 1355   CREATININE 0.7 06/28/2017 1114      Component Value Date/Time   CALCIUM 10.3 07/05/2021 1308   CALCIUM 9.5 12/19/2017 1355   CALCIUM 9.5 06/28/2017 1114   ALKPHOS 105 07/05/2021 1308   ALKPHOS 88 (H) 12/19/2017 1355   ALKPHOS 88 06/28/2017 1114   AST 16 07/05/2021 1308   AST 16 06/28/2017 1114   ALT 20 07/05/2021 1308   ALT 16 12/19/2017 1355   ALT 13 06/28/2017 1114   BILITOT 0.4 07/05/2021 1308   BILITOT 0.30 06/28/2017 1114       Impression and Plan: Jamie Mcdowell is a very pleasant 60 yo caucasian female with history of iron deficiency anemia. Iron studies are pending. We can replace if needed.  She states that she will follow-up with her PCP if she has another fall.  Follow-up in another year.  She can contact our office with any questions or concerns. We can certainly see her  sooner if needed.   [name], NP 7/13/20222:40 PM

## 2021-07-06 ENCOUNTER — Telehealth: Payer: Self-pay

## 2021-07-06 LAB — IRON AND TIBC
Iron: 86 ug/dL (ref 41–142)
Saturation Ratios: 30 % (ref 21–57)
TIBC: 288 ug/dL (ref 236–444)
UIBC: 202 ug/dL (ref 120–384)

## 2021-07-06 LAB — FERRITIN: Ferritin: 420 ng/mL — ABNORMAL HIGH (ref 11–307)

## 2021-09-28 ENCOUNTER — Other Ambulatory Visit: Payer: Self-pay

## 2021-09-28 ENCOUNTER — Ambulatory Visit
Admission: RE | Admit: 2021-09-28 | Discharge: 2021-09-28 | Disposition: A | Discharge status: Home / Self Care | Payer: 59 | Source: Ambulatory Visit | Attending: Family Medicine | Admitting: Family Medicine

## 2021-09-28 DIAGNOSIS — E2839 Other primary ovarian failure: Secondary | ICD-10-CM

## 2021-12-26 ENCOUNTER — Telehealth: Payer: Self-pay | Admitting: *Deleted

## 2021-12-26 ENCOUNTER — Other Ambulatory Visit: Payer: Self-pay | Admitting: *Deleted

## 2021-12-26 DIAGNOSIS — D500 Iron deficiency anemia secondary to blood loss (chronic): Secondary | ICD-10-CM

## 2021-12-26 NOTE — Telephone Encounter (Signed)
Received a call from patient stating that she has been having symptoms similar to when she had iron deficiency and for peace of mind just wanted to check labwork.  Ok per Lottie Dawson NP.  Orders placed and appts made.

## 2021-12-28 ENCOUNTER — Other Ambulatory Visit: Payer: Self-pay

## 2021-12-28 ENCOUNTER — Inpatient Hospital Stay: Payer: 59 | Attending: Hematology & Oncology

## 2021-12-28 DIAGNOSIS — E559 Vitamin D deficiency, unspecified: Secondary | ICD-10-CM | POA: Diagnosis not present

## 2021-12-28 DIAGNOSIS — D509 Iron deficiency anemia, unspecified: Secondary | ICD-10-CM | POA: Insufficient documentation

## 2021-12-28 DIAGNOSIS — D500 Iron deficiency anemia secondary to blood loss (chronic): Secondary | ICD-10-CM

## 2021-12-28 LAB — IRON AND IRON BINDING CAPACITY (CC-WL,HP ONLY)
Iron: 121 ug/dL (ref 28–170)
Saturation Ratios: 40 % — ABNORMAL HIGH (ref 10.4–31.8)
TIBC: 304 ug/dL (ref 250–450)
UIBC: 183 ug/dL (ref 148–442)

## 2021-12-28 LAB — RETICULOCYTES
Immature Retic Fract: 12.6 % (ref 2.3–15.9)
RBC.: 5.02 MIL/uL (ref 3.87–5.11)
Retic Count, Absolute: 141.1 10*3/uL (ref 19.0–186.0)
Retic Ct Pct: 2.8 % (ref 0.4–3.1)

## 2021-12-28 LAB — COMPREHENSIVE METABOLIC PANEL
ALT: 21 U/L (ref 0–44)
AST: 15 U/L (ref 15–41)
Albumin: 4.5 g/dL (ref 3.5–5.0)
Alkaline Phosphatase: 84 U/L (ref 38–126)
Anion gap: 9 (ref 5–15)
BUN: 13 mg/dL (ref 6–20)
CO2: 27 mmol/L (ref 22–32)
Calcium: 10.1 mg/dL (ref 8.9–10.3)
Chloride: 106 mmol/L (ref 98–111)
Creatinine, Ser: 0.69 mg/dL (ref 0.44–1.00)
GFR, Estimated: >60 mL/min (ref >60)
Glucose, Bld: 93 mg/dL (ref 70–99)
Potassium: 3.9 mmol/L (ref 3.5–5.1)
Sodium: 142 mmol/L (ref 135–145)
Total Bilirubin: 0.6 mg/dL (ref 0.3–1.2)
Total Protein: 7 g/dL (ref 6.5–8.1)

## 2021-12-28 LAB — CBC WITH DIFFERENTIAL (CANCER CENTER ONLY)
Abs Immature Granulocytes: 0 10*3/uL (ref 0.00–0.07)
Basophils Absolute: 0.1 10*3/uL (ref 0.0–0.1)
Basophils Relative: 1 %
Eosinophils Absolute: 0.1 10*3/uL (ref 0.0–0.5)
Eosinophils Relative: 2 %
HCT: 46.3 % — ABNORMAL HIGH (ref 36.0–46.0)
Hemoglobin: 14.9 g/dL (ref 12.0–15.0)
Immature Granulocytes: 0 %
Lymphocytes Relative: 34 %
Lymphs Abs: 1.5 10*3/uL (ref 0.7–4.0)
MCH: 29 pg (ref 26.0–34.0)
MCHC: 32.2 g/dL (ref 30.0–36.0)
MCV: 90.1 fL (ref 80.0–100.0)
Monocytes Absolute: 0.4 10*3/uL (ref 0.1–1.0)
Monocytes Relative: 9 %
Neutro Abs: 2.4 10*3/uL (ref 1.7–7.7)
Neutrophils Relative %: 54 %
Platelet Count: 272 10*3/uL (ref 150–400)
RBC: 5.14 MIL/uL — ABNORMAL HIGH (ref 3.87–5.11)
RDW: 13.5 % (ref 11.5–15.5)
WBC Count: 4.5 10*3/uL (ref 4.0–10.5)
nRBC: 0 % (ref 0.0–0.2)

## 2021-12-28 LAB — VITAMIN D 25 HYDROXY (VIT D DEFICIENCY, FRACTURES): Vit D, 25-Hydroxy: 25.49 ng/mL — ABNORMAL LOW (ref 30–100)

## 2021-12-28 LAB — FERRITIN: Ferritin: 458 ng/mL — ABNORMAL HIGH (ref 11–307)

## 2021-12-29 ENCOUNTER — Telehealth: Payer: Self-pay | Admitting: *Deleted

## 2021-12-29 ENCOUNTER — Other Ambulatory Visit: Payer: Self-pay | Admitting: *Deleted

## 2021-12-29 DIAGNOSIS — D500 Iron deficiency anemia secondary to blood loss (chronic): Secondary | ICD-10-CM

## 2021-12-29 MED ORDER — VITAMIN D (ERGOCALCIFEROL) 1.25 MG (50000 UNIT) PO CAPS: ORAL_CAPSULE | ORAL | 4 refills | Status: AC

## 2021-12-29 NOTE — Telephone Encounter (Signed)
As noted below by Gillian Shields, I informed patient that her iron studies look great! Your Vitamin D level is a little low. Patient stated,"I am out of Vitamin D. I need a refill." I refilled her Vitamin D per Judson Roch. Patient verbalized understanding.

## 2021-12-29 NOTE — Telephone Encounter (Signed)
-----   Message from Celso Amy, NP sent at 12/29/2021 12:15 PM EST ----- Is she taking a vit D supplement? She is a little low. Iron studies look great!   ----- Message ----- From: Interface, Lab In [facility] Sent: 12/28/2021   1:32 PM EST To: Celso Amy, NP

## 2022-07-05 ENCOUNTER — Inpatient Hospital Stay (HOSPITAL_BASED_OUTPATIENT_CLINIC_OR_DEPARTMENT_OTHER): Payer: 59 | Admitting: Family

## 2022-07-05 ENCOUNTER — Encounter: Payer: Self-pay | Admitting: Family

## 2022-07-05 ENCOUNTER — Inpatient Hospital Stay: Payer: 59 | Attending: Hematology & Oncology

## 2022-07-05 VITALS — BP 146/89 | HR 67 | Temp 98.3°F | Resp 17 | Wt 180.8 lb

## 2022-07-05 DIAGNOSIS — D500 Iron deficiency anemia secondary to blood loss (chronic): Secondary | ICD-10-CM | POA: Diagnosis not present

## 2022-07-05 DIAGNOSIS — E559 Vitamin D deficiency, unspecified: Secondary | ICD-10-CM

## 2022-07-05 DIAGNOSIS — D509 Iron deficiency anemia, unspecified: Secondary | ICD-10-CM | POA: Insufficient documentation

## 2022-07-05 LAB — CBC WITH DIFFERENTIAL (CANCER CENTER ONLY)
Abs Immature Granulocytes: 0.05 10*3/uL (ref 0.00–0.07)
Basophils Absolute: 0.1 10*3/uL (ref 0.0–0.1)
Basophils Relative: 1 %
Eosinophils Absolute: 0.1 10*3/uL (ref 0.0–0.5)
Eosinophils Relative: 2 %
HCT: 45.7 % (ref 36.0–46.0)
Hemoglobin: 14.9 g/dL (ref 12.0–15.0)
Immature Granulocytes: 1 %
Lymphocytes Relative: 30 %
Lymphs Abs: 1.8 10*3/uL (ref 0.7–4.0)
MCH: 29.3 pg (ref 26.0–34.0)
MCHC: 32.6 g/dL (ref 30.0–36.0)
MCV: 90 fL (ref 80.0–100.0)
Monocytes Absolute: 0.4 10*3/uL (ref 0.1–1.0)
Monocytes Relative: 7 %
Neutro Abs: 3.4 10*3/uL (ref 1.7–7.7)
Neutrophils Relative %: 59 %
Platelet Count: 281 10*3/uL (ref 150–400)
RBC: 5.08 MIL/uL (ref 3.87–5.11)
RDW: 13.3 % (ref 11.5–15.5)
WBC Count: 5.8 10*3/uL (ref 4.0–10.5)
nRBC: 0 % (ref 0.0–0.2)

## 2022-07-05 LAB — CMP (CANCER CENTER ONLY)
ALT: 15 U/L (ref 0–44)
AST: 14 U/L — ABNORMAL LOW (ref 15–41)
Albumin: 4.6 g/dL (ref 3.5–5.0)
Alkaline Phosphatase: 82 U/L (ref 38–126)
Anion gap: 9 (ref 5–15)
BUN: 18 mg/dL (ref 6–20)
CO2: 25 mmol/L (ref 22–32)
Calcium: 10.1 mg/dL (ref 8.9–10.3)
Chloride: 108 mmol/L (ref 98–111)
Creatinine: 0.69 mg/dL (ref 0.44–1.00)
GFR, Estimated: >60 mL/min (ref >60)
Glucose, Bld: 99 mg/dL (ref 70–99)
Potassium: 4.2 mmol/L (ref 3.5–5.1)
Sodium: 142 mmol/L (ref 135–145)
Total Bilirubin: 0.5 mg/dL (ref 0.3–1.2)
Total Protein: 7 g/dL (ref 6.5–8.1)

## 2022-07-05 LAB — RETICULOCYTES
Immature Retic Fract: 12.1 % (ref 2.3–15.9)
RBC.: 5.04 MIL/uL (ref 3.87–5.11)
Retic Count, Absolute: 133.6 10*3/uL (ref 19.0–186.0)
Retic Ct Pct: 2.7 % (ref 0.4–3.1)

## 2022-07-05 LAB — IRON AND IRON BINDING CAPACITY (CC-WL,HP ONLY)
Iron: 76 ug/dL (ref 28–170)
Saturation Ratios: 25 % (ref 10.4–31.8)
TIBC: 307 ug/dL (ref 250–450)
UIBC: 231 ug/dL (ref 148–442)

## 2022-07-05 LAB — FERRITIN: Ferritin: 407 ng/mL — ABNORMAL HIGH (ref 11–307)

## 2022-07-05 NOTE — Progress Notes (Signed)
Hematology and Oncology Follow Up Visit  Jamie Mcdowell [id number] July 22, 1961 61 y.o. 07/05/2022   Principle Diagnosis:  Recurrent iron deficiency anemia   Current Therapy:        IV iron as indicated   Interim History:  Jamie Mcdowell is here today for her annual follow-up. She is doing quite well but notes some fatigue, dizziness and mild SOB with exertion.  She has not noted any blood loss. No bruising or petechiae.  No fever, chills, n/v, cough, rash, chest pain, palpitations, abdominal pain or changes in bowel or bladder habits.  No swelling, tenderness, numbness or tingling in her extremities.  No falls or syncope.  She has maintained a good appetite and is staying well hydrated. Her weight is stable at 180 lbs.   ECOG Performance Status: 1 - Symptomatic but completely ambulatory  Medications:  Allergies as of 07/05/2022       Reactions   Penicillins Anaphylaxis   Ciprocinonide [fluocinolone] Other (See Comments)   Unknown reaction   Latex Itching, Rash   Sulfa Antibiotics Rash        Medication List        Accurate as of July 05, 2022  1:34 PM. If you have any questions, ask your nurse or doctor.          acetaminophen 500 MG tablet Commonly known as: TYLENOL Take 2 tablets (1,000 mg total) by mouth every 6 (six) hours. After 3-4 days, can change to taking only as needed What changed:  when to take this reasons to take this   amLODipine 5 MG tablet Commonly known as: NORVASC Take 5 mg by mouth daily.   cloNIDine 0.2 MG tablet Commonly known as: Catapres Take 1 tablet (0.2 mg total) by mouth 2 (two) times daily. What changed:  how much to take when to take this additional instructions   docusate sodium 100 MG capsule Commonly known as: COLACE Take 1 capsule (100 mg total) by mouth 2 (two) times daily.   fexofenadine 180 MG tablet Commonly known as: ALLEGRA Take 180 mg by mouth as needed for allergies or rhinitis.   ondansetron 4 MG disintegrating  tablet Commonly known as: ZOFRAN-ODT Take 1 tablet (4 mg total) by mouth every 6 (six) hours as needed for nausea.   Vitamin D (Ergocalciferol) 1.25 MG (50000 UNIT) Caps capsule Commonly known as: DRISDOL TAKE 1 CAPSULE BY MOUTH TWICE A WEEK        Allergies:  Allergies  Allergen Reactions   Penicillins Anaphylaxis   Ciprocinonide [Fluocinolone] Other (See Comments)    Unknown reaction   Latex Itching and Rash   Sulfa Antibiotics Rash    Past Medical History, Surgical history, Social history, and Family History were reviewed and updated.  Review of Systems: All other 10 point review of systems is negative.   Physical Exam:  weight is 180 lb 12.8 oz (82 kg). Her oral temperature is 98.3 F (36.8 C). Her blood pressure is 146/89 (abnormal) and her pulse is 67. Her respiration is 17 and oxygen saturation is 100%.   Wt Readings from Last 3 Encounters:  07/05/22 180 lb 12.8 oz (82 kg)  07/05/21 179 lb (81.2 kg)  02/22/21 177 lb 1.9 oz (80.3 kg)    Ocular: Sclerae unicteric, pupils equal, round and reactive to light Ear-nose-throat: Oropharynx clear, dentition fair Lymphatic: No cervical or supraclavicular adenopathy Lungs no rales or rhonchi, good excursion bilaterally Heart regular rate and rhythm, no murmur appreciated Abd soft, nontender, positive bowel  sounds MSK no focal spinal tenderness, no joint edema Neuro: non-focal, well-oriented, appropriate affect Breasts: Deferred   Lab Results  Component Value Date   WBC 5.8 07/05/2022   HGB 14.9 07/05/2022   HCT 45.7 07/05/2022   MCV 90.0 07/05/2022   PLT 281 07/05/2022   Lab Results  Component Value Date   FERRITIN 458 (H) 12/28/2021   IRON 121 12/28/2021   TIBC 304 12/28/2021   UIBC 183 12/28/2021   IRONPCTSAT 40 (H) 12/28/2021   Lab Results  Component Value Date   RETICCTPCT 2.7 07/05/2022   RBC 5.08 07/05/2022   RBC 5.04 07/05/2022   RETICCTABS 139.2 12/12/2015   No results found for:  "KPAFRELGTCHN", "LAMBDASER", "KAPLAMBRATIO" No results found for: "IGGSERUM", "IGA", "IGMSERUM" Lab Results  Component Value Date   TOTALPROTELP 6.8 02/28/2012   ALBUMINELP 57.8 02/28/2012   A1GS 4.0 02/28/2012   A2GS 11.7 02/28/2012   BETS 6.7 02/28/2012   BETA2SER 5.2 02/28/2012   GAMS 14.6 02/28/2012   MSPIKE NOT DET 02/28/2012   SPEI * 02/28/2012     Chemistry      Component Value Date/Time   NA 142 12/28/2021 0953   NA 148 (H) 12/19/2017 1355   NA 143 06/28/2017 1114   K 3.9 12/28/2021 0953   K 4.1 12/19/2017 1355   K 4.0 06/28/2017 1114   CL 106 12/28/2021 0953   CL 108 12/19/2017 1355   CO2 27 12/28/2021 0953   CO2 27 12/19/2017 1355   CO2 23 06/28/2017 1114   BUN 13 12/28/2021 0953   BUN 12 12/19/2017 1355   BUN 11.0 06/28/2017 1114   CREATININE 0.69 12/28/2021 0953   CREATININE 0.74 07/05/2021 1308   CREATININE 0.9 12/19/2017 1355   CREATININE 0.7 06/28/2017 1114      Component Value Date/Time   CALCIUM 10.1 12/28/2021 0953   CALCIUM 9.5 12/19/2017 1355   CALCIUM 9.5 06/28/2017 1114   ALKPHOS 84 12/28/2021 0953   ALKPHOS 88 (H) 12/19/2017 1355   ALKPHOS 88 06/28/2017 1114   AST 15 12/28/2021 0953   AST 16 07/05/2021 1308   AST 16 06/28/2017 1114   ALT 21 12/28/2021 0953   ALT 20 07/05/2021 1308   ALT 16 12/19/2017 1355   ALT 13 06/28/2017 1114   BILITOT 0.6 12/28/2021 0953   BILITOT 0.4 07/05/2021 1308   BILITOT 0.30 06/28/2017 1114       Impression and Plan: Jamie Mcdowell is a very pleasant 61 yo caucasian female with history of iron deficiency anemia. Iron studies pending.  Follow-up in 1 year!  Lottie Dawson, NP 7/13/20231:34 PM

## 2022-07-06 ENCOUNTER — Telehealth: Payer: Self-pay

## 2022-07-06 NOTE — Telephone Encounter (Signed)
Advised via MyChart.

## 2022-07-06 NOTE — Telephone Encounter (Signed)
-----   Message from Volanda Napoleon, MD sent at 07/05/2022  8:39 PM EDT ----- Call - the iron level is ok!!  [name]

## 2023-06-25 ENCOUNTER — Encounter: Payer: Self-pay | Admitting: Family

## 2023-07-03 ENCOUNTER — Inpatient Hospital Stay: Payer: Self-pay | Admitting: Family

## 2023-07-03 ENCOUNTER — Inpatient Hospital Stay: Payer: 59
